# Patient Record
Sex: Female | Born: 1950
Health system: Southern US, Community
[De-identification: ages and names within clinical notes are randomized; demographics above are authoritative.]

## PROBLEM LIST (undated history)

## (undated) DIAGNOSIS — R251 Tremor, unspecified: Secondary | ICD-10-CM

## (undated) DIAGNOSIS — K219 Gastro-esophageal reflux disease without esophagitis: Secondary | ICD-10-CM

## (undated) DIAGNOSIS — M1990 Unspecified osteoarthritis, unspecified site: Secondary | ICD-10-CM

## (undated) DIAGNOSIS — M7650 Patellar tendinitis, unspecified knee: Secondary | ICD-10-CM

## (undated) DIAGNOSIS — Z8619 Personal history of other infectious and parasitic diseases: Secondary | ICD-10-CM

## (undated) DIAGNOSIS — E785 Hyperlipidemia, unspecified: Secondary | ICD-10-CM

## (undated) DIAGNOSIS — I8390 Asymptomatic varicose veins of unspecified lower extremity: Secondary | ICD-10-CM

## (undated) DIAGNOSIS — N189 Chronic kidney disease, unspecified: Secondary | ICD-10-CM

## (undated) DIAGNOSIS — E669 Obesity, unspecified: Secondary | ICD-10-CM

## (undated) DIAGNOSIS — E119 Type 2 diabetes mellitus without complications: Secondary | ICD-10-CM

## (undated) DIAGNOSIS — J40 Bronchitis, not specified as acute or chronic: Secondary | ICD-10-CM

## (undated) DIAGNOSIS — B269 Mumps without complication: Secondary | ICD-10-CM

## (undated) DIAGNOSIS — M79609 Pain in unspecified limb: Secondary | ICD-10-CM

## (undated) DIAGNOSIS — B029 Zoster without complications: Secondary | ICD-10-CM

## (undated) DIAGNOSIS — B059 Measles without complication: Secondary | ICD-10-CM

## (undated) DIAGNOSIS — I10 Essential (primary) hypertension: Secondary | ICD-10-CM

## (undated) HISTORY — DX: Obesity, unspecified: E66.9

## (undated) HISTORY — PX: BREAST BIOPSY: SHX20

## (undated) HISTORY — PX: TONSILLECTOMY: SUR1361

## (undated) HISTORY — DX: Patellar tendinitis, unspecified knee: M76.50

## (undated) HISTORY — PX: COLONOSCOPY: SHX174

## (undated) HISTORY — DX: Pain in unspecified limb: M79.609

## (undated) HISTORY — PX: BREAST EXCISIONAL BIOPSY: SUR124

## (undated) HISTORY — DX: Chronic kidney disease, unspecified: N18.9

## (undated) HISTORY — PX: LASIK: SHX215

## (undated) HISTORY — DX: Essential (primary) hypertension: I10

## (undated) HISTORY — PX: OTHER SURGICAL HISTORY: SHX169

## (undated) HISTORY — DX: Gastro-esophageal reflux disease without esophagitis: K21.9

## (undated) HISTORY — DX: Hyperlipidemia, unspecified: E78.5

## (undated) HISTORY — DX: Asymptomatic varicose veins of unspecified lower extremity: I83.90

## (undated) HISTORY — PX: VARICOSE VEIN SURGERY: SHX832

## (undated) HISTORY — DX: Type 2 diabetes mellitus without complications: E11.9

---

## 1995-04-09 HISTORY — PX: ABDOMINAL HYSTERECTOMY: SHX81

## 1998-02-03 ENCOUNTER — Encounter: Payer: Self-pay | Admitting: Obstetrics and Gynecology

## 1998-02-03 ENCOUNTER — Ambulatory Visit (HOSPITAL_COMMUNITY): Admission: RE | Admit: 1998-02-03 | Discharge: 1998-02-03 | Payer: Self-pay | Admitting: Obstetrics and Gynecology

## 1998-10-05 ENCOUNTER — Ambulatory Visit (HOSPITAL_COMMUNITY): Admission: RE | Admit: 1998-10-05 | Discharge: 1998-10-05 | Payer: Self-pay | Admitting: Obstetrics and Gynecology

## 1998-10-05 ENCOUNTER — Encounter: Payer: Self-pay | Admitting: Obstetrics and Gynecology

## 1999-01-12 ENCOUNTER — Encounter: Payer: Self-pay | Admitting: Obstetrics and Gynecology

## 1999-08-08 ENCOUNTER — Ambulatory Visit (HOSPITAL_COMMUNITY): Admission: RE | Admit: 1999-08-08 | Discharge: 1999-08-08 | Payer: Self-pay | Admitting: Orthopedic Surgery

## 1999-08-08 ENCOUNTER — Encounter: Payer: Self-pay | Admitting: Orthopedic Surgery

## 2000-03-25 ENCOUNTER — Ambulatory Visit (HOSPITAL_COMMUNITY): Admission: RE | Admit: 2000-03-25 | Discharge: 2000-03-25 | Payer: Self-pay | Admitting: Obstetrics and Gynecology

## 2000-03-25 ENCOUNTER — Encounter: Payer: Self-pay | Admitting: Obstetrics and Gynecology

## 2000-05-14 ENCOUNTER — Ambulatory Visit (HOSPITAL_COMMUNITY): Admission: RE | Admit: 2000-05-14 | Discharge: 2000-05-14 | Payer: Self-pay | Admitting: Obstetrics and Gynecology

## 2002-02-24 ENCOUNTER — Encounter: Payer: Self-pay | Admitting: Obstetrics and Gynecology

## 2002-02-24 ENCOUNTER — Ambulatory Visit (HOSPITAL_COMMUNITY): Admission: RE | Admit: 2002-02-24 | Discharge: 2002-02-24 | Payer: Self-pay | Admitting: Obstetrics and Gynecology

## 2003-04-07 ENCOUNTER — Ambulatory Visit (HOSPITAL_COMMUNITY): Admission: RE | Admit: 2003-04-07 | Discharge: 2003-04-07 | Payer: Self-pay | Admitting: Obstetrics and Gynecology

## 2003-04-09 HISTORY — PX: KNEE SURGERY: SHX244

## 2003-06-24 ENCOUNTER — Ambulatory Visit (HOSPITAL_BASED_OUTPATIENT_CLINIC_OR_DEPARTMENT_OTHER): Admission: RE | Admit: 2003-06-24 | Discharge: 2003-06-24 | Payer: Self-pay | Admitting: Specialist

## 2003-06-24 ENCOUNTER — Ambulatory Visit (HOSPITAL_COMMUNITY): Admission: RE | Admit: 2003-06-24 | Discharge: 2003-06-24 | Payer: Self-pay | Admitting: Specialist

## 2004-04-06 ENCOUNTER — Ambulatory Visit (HOSPITAL_COMMUNITY): Admission: RE | Admit: 2004-04-06 | Discharge: 2004-04-06 | Payer: Self-pay | Admitting: Obstetrics and Gynecology

## 2005-06-05 ENCOUNTER — Encounter: Payer: Self-pay | Admitting: Obstetrics and Gynecology

## 2005-12-17 ENCOUNTER — Ambulatory Visit (HOSPITAL_COMMUNITY): Admission: RE | Admit: 2005-12-17 | Discharge: 2005-12-17 | Payer: Self-pay | Admitting: Gastroenterology

## 2006-08-22 ENCOUNTER — Ambulatory Visit (HOSPITAL_COMMUNITY): Admission: RE | Admit: 2006-08-22 | Discharge: 2006-08-22 | Payer: Self-pay | Admitting: Obstetrics and Gynecology

## 2007-12-07 ENCOUNTER — Ambulatory Visit (HOSPITAL_COMMUNITY): Admission: RE | Admit: 2007-12-07 | Discharge: 2007-12-07 | Payer: Self-pay | Admitting: Obstetrics and Gynecology

## 2008-02-17 ENCOUNTER — Ambulatory Visit (HOSPITAL_COMMUNITY): Admission: RE | Admit: 2008-02-17 | Discharge: 2008-02-17 | Payer: Self-pay | Admitting: Endocrinology

## 2009-04-06 ENCOUNTER — Ambulatory Visit (HOSPITAL_COMMUNITY): Admission: RE | Admit: 2009-04-06 | Discharge: 2009-04-06 | Payer: Self-pay | Admitting: Obstetrics and Gynecology

## 2009-06-19 ENCOUNTER — Encounter: Payer: Self-pay | Admitting: Sports Medicine

## 2009-07-13 ENCOUNTER — Ambulatory Visit: Payer: Self-pay | Admitting: Sports Medicine

## 2009-07-13 DIAGNOSIS — M7650 Patellar tendinitis, unspecified knee: Secondary | ICD-10-CM | POA: Insufficient documentation

## 2009-07-13 DIAGNOSIS — M79609 Pain in unspecified limb: Secondary | ICD-10-CM | POA: Insufficient documentation

## 2010-01-09 ENCOUNTER — Ambulatory Visit (HOSPITAL_COMMUNITY): Admission: RE | Admit: 2010-01-09 | Discharge: 2010-01-09 | Payer: Self-pay | Admitting: Endocrinology

## 2010-05-08 NOTE — Consult Note (Signed)
Summary: [facility]  [facility]   Imported By: Marily Memos 07/14/2009 10:33:22  _____________________________________________________________________  External Attachment:    Type:   Image     Comment:   External Document

## 2010-05-08 NOTE — Assessment & Plan Note (Signed)
Summary: WC,NP,L KNEE INJURY,MC   Vital Signs:  Patient profile:   60 year old female Height:      67 inches Weight:      210 pounds BMI:     33.01 BP sitting:   108 / 68  Vitals Entered By: Lillia Pauls CMA (July 13, 2009 11:41 AM)  History of Present Illness: WC Injury DOI 06/05/09 Seen at [facility] 06/06/09 had a fall directly onto left knee anteriorly after tripping over [place] cable knee swelled immediately to top of patella and down used ice on her exam bruising noted over patellas and swelling in this area even on 3/14  Since then the ant continues to hurt thre is a knot present below knee that is tender w pressure no locking, giving out or IA joint sxs    Second problem which she can't specifically say was 2/2 injury Late March awkened with neck pain has persisted since then could not turn neck left to right went to kneaded energy - massage did not help Ice pak helps memory foam pillow helps  Allergies (verified): No Known Drug Allergies  Physical Exam  General:  Well-developed,well-nourished,in no acute distress; alert,appropriate and cooperative throughout examination overweight-appearing.   Msk:  LEFT knee exam shows no effusion; stable ligaments; negative Mcmurray's and provocative meniscal tests; non painful patellar compression; patellar and quadriceps tendons unremarkable.  area at distal PT insertion and in soft tissue around this is still slt swollen there is a nodular sensation on palp mildly TTP Additional Exam:  MSK [place] There continues to be some swelling around the patellar tendon that is increased at distal insertion into TT There is some hypoechoic change to medial side of pat tendon these look like to discreet areas with diameter of 2 cms consistent in appearnce with old organized hematomas noevessels are present in this area on doppler with mild inc in flow  images saved   Impression & Recommendations:  Problem # 1:  LEG PAIN, LEFT  (ICD-729.5)  This is actually below knee joint proper consistent with her fall and impact injury now with old hematomas that are resolving but still TTP  would cont ice for these  Orders: [place] LIMITED ([id number])  Problem # 2:  PATELLAR TENDINITIS (ICD-726.64)  This is likely post traumatic and the swelling around tendon is now mild trial on voltaren gel keep up ice massage to tendon  watch down steps watch sitting with too much knee bend  do some str leg raises to keep quad strength  reck if not resolving  Orders: [place] LIMITED ([id number])  Complete Medication List: 1)  Voltaren 1 % Gel (Diclofenac sodium) .... Use 1 to 2 gram qid Prescriptions: VOLTAREN 1 % GEL (DICLOFENAC SODIUM) use 1 to 2 gram qid  #200 grams x 3   Entered and Authorized by:   Enid Baas MD   Signed by:   Enid Baas MD on 07/13/2009   Method used:   Electronically to        [facility]* (retail)       [address]       [address] Shipping/mailing       [place], [place]  [address]       Ph: [phone]       Fax: [phone]   RxID:   [id number]

## 2010-08-24 NOTE — Op Note (Signed)
Jessica Bowers, Jessica Bowers              ACCOUNT NO.:  [id number]   MEDICAL RECORD NO.:  [id number]          PATIENT TYPE:  AMB   LOCATION:  ENDO                         FACILITY:  [facility]   PHYSICIAN:  Bernette Redbird, M.D.   DATE OF BIRTH:  [birth year]   DATE OF PROCEDURE:  12/17/2005  DATE OF DISCHARGE:                                 OPERATIVE REPORT   PROCEDURE:  Colonoscopy.   INDICATIONS:  Initial colon cancer screening exam in a patient with a family  history of colon polyps.   FINDINGS:  Normal exam to the terminal ileum.   DESCRIPTION OF PROCEDURE:  The nature, purpose, and risks of the procedure  had been discussed with the patient through our office open access program;  and I reviewed the purpose and risks with the patient prior to the exam.  She had provided written consent.  Sedation prior to and during the course  of the exam totaled:  Fentanyl 100 mcg and Versed 7 mg IV.  There was some  initial transient desaturation to 86% which resolved by lifting the  patient's chin.   The Olympus adult adjustable tension video colonoscope was advanced the  terminal ileum without significant difficulty, just some minor looping.  Pullback was then performed.  The quality of prep was excellent; and it was  felt that all areas were well seen.  This was a normal examination.  No  polyps, cancer, colitis, vascular malformations, or diverticulosis were  noted.  Retroflexion in the rectum and reinspection of the rectum were  unremarkable.   No biopsies were obtained.  The patient tolerated the procedure well and  there no apparent complications.   IMPRESSION:  Normal screening colonoscopy in a patient with a family history  of colon polyps in her mother (histology not known).   PLAN:  Repeat colonoscopy in 5 years.           ______________________________  Bernette Redbird, M.D.     RB/MEDQ  D:  12/17/2005  T:  12/17/2005  Job:  [id number]   cc:   Alfonse Alpers. Dagoberto Ligas, M.D.

## 2010-08-24 NOTE — Op Note (Signed)
NAMELAYNIE, ESPY                          ACCOUNT NO.:  [id number]   MEDICAL RECORD NO.:  [id number]                   PATIENT TYPE:  AMB   LOCATION:  DSC                                  FACILITY:  [facility]   PHYSICIAN:  Erasmo Leventhal, M.D.         DATE OF BIRTH:  Jan 22, 1951   DATE OF PROCEDURE:  06/24/2003  DATE OF DISCHARGE:  06/24/2003                                 OPERATIVE REPORT   PREOPERATIVE DIAGNOSIS:  Right knee osteoarthritis, probable torn meniscus.   POSTOPERATIVE DIAGNOSIS:  Right knee osteoarthritis.   PROCEDURE:  Right knee arthroscopic tricompartmental chondroplasty,  synovectomy.   SURGEON:  Erasmo Leventhal, M.D.   ASSISTANT:  Jaquelyn Bitter. Chabon, P.A.   ANESTHESIA:  Local block with MAC.   ESTIMATED BLOOD LOSS:  Less than 15 cubic centimeters.   DRAINS:  None.   COMPLICATIONS:  None.   TOURNIQUET TIME:  None.   DISPOSITION:  To the PACU stable.   OPERATIVE DETAILS:  The patient and family were counseled in the holding  area, the correct side was identified, IV started, antibiotics given and  block was administered. She was taken to the operating room and placed in  the supine position under initially MAC anesthesia, prepped with DuraPrep  and auto-draped in sterile fashion.   While trying to make the proximal medial portal, she was having some  discomfort and she was now placed deeper with antibiotics at this time.  Following this, the proximal medial portal was established. The knee was  infiltrated with saline.   Proximal medial, anteromedial and anterolateral portals now established.  Patellofemoral joint revealed a normal tracking but unfortunately, there was  grade III chondromalacia of the patella and grade III of the femoral  trochlea and grade IV on the lateral aspect of the femoral trochlea.  Mechanical chondroplasty was performed with mechanical shaving back to a  stable base of all chondral flaps and synovectomy was  performed. The ACL and  PCL were intact. The medial side was inspected with a grade III  chondromalacia of the medial femoral condyle and grade II of the medial  tibial plateau. The medial meniscus was found to be without tear, but it was  thickened, hard and degenerative, but no tears. The lateral side was  inspected. The lateral meniscus was intact and there was grade II  chondromalacia. Light chondroplasty was performed there also. Synovectomy  was performed anteromedial and anterolateral. The knee was then sequentially  reinspected, there were no other abnormalities noted. After copious  irrigation the arthroscopic equipment was removed. The two ports were closed  with one suture. Note - 15 cubic centimeters of 0.25% Marcaine with epi and  4 mg of morphine sulfate was used to obtain hemostasis and as a knee block.  A sterile compressive dressing was applied to the knee, TED hose and ice  pack.   There were no complications. Sponge and needle count were correct. She  was  then gently awakened and taken to the recovery room in satisfactory stable  condition.   The plan will be stabilization in the PACU, then discharged to home.                                               Erasmo Leventhal, M.D.    RAC/MEDQ  D:  06/24/2003  T:  06/27/2003  Job:  [id number]

## 2010-12-28 ENCOUNTER — Other Ambulatory Visit (HOSPITAL_COMMUNITY): Payer: Self-pay | Admitting: Obstetrics and Gynecology

## 2010-12-28 DIAGNOSIS — N83209 Unspecified ovarian cyst, unspecified side: Secondary | ICD-10-CM

## 2010-12-29 ENCOUNTER — Ambulatory Visit (HOSPITAL_COMMUNITY): Payer: 59

## 2011-01-01 ENCOUNTER — Other Ambulatory Visit (HOSPITAL_COMMUNITY): Payer: Self-pay | Admitting: Obstetrics and Gynecology

## 2011-01-01 ENCOUNTER — Ambulatory Visit (HOSPITAL_COMMUNITY)
Admission: RE | Admit: 2011-01-01 | Discharge: 2011-01-01 | Disposition: A | Discharge status: Home / Self Care | Payer: 59 | Source: Ambulatory Visit | Attending: Obstetrics and Gynecology | Admitting: Obstetrics and Gynecology

## 2011-01-01 DIAGNOSIS — N83209 Unspecified ovarian cyst, unspecified side: Secondary | ICD-10-CM

## 2012-01-22 ENCOUNTER — Ambulatory Visit: Payer: 59 | Attending: Family Medicine | Admitting: Physical Therapy

## 2012-01-22 DIAGNOSIS — M25676 Stiffness of unspecified foot, not elsewhere classified: Secondary | ICD-10-CM | POA: Insufficient documentation

## 2012-01-22 DIAGNOSIS — M25673 Stiffness of unspecified ankle, not elsewhere classified: Secondary | ICD-10-CM | POA: Insufficient documentation

## 2012-01-22 DIAGNOSIS — M25579 Pain in unspecified ankle and joints of unspecified foot: Secondary | ICD-10-CM | POA: Insufficient documentation

## 2012-01-22 DIAGNOSIS — IMO0001 Reserved for inherently not codable concepts without codable children: Secondary | ICD-10-CM | POA: Insufficient documentation

## 2012-01-23 ENCOUNTER — Ambulatory Visit: Payer: 59 | Admitting: Physical Therapy

## 2012-01-28 ENCOUNTER — Ambulatory Visit: Payer: 59 | Admitting: Physical Therapy

## 2012-01-29 ENCOUNTER — Ambulatory Visit: Payer: 59

## 2012-01-30 ENCOUNTER — Ambulatory Visit: Payer: 59 | Admitting: Physical Therapy

## 2012-02-05 ENCOUNTER — Encounter: Payer: 59 | Admitting: Physical Therapy

## 2012-02-05 ENCOUNTER — Ambulatory Visit: Payer: 59

## 2012-02-07 ENCOUNTER — Ambulatory Visit: Payer: 59 | Attending: Family Medicine | Admitting: Physical Therapy

## 2012-02-07 DIAGNOSIS — M25579 Pain in unspecified ankle and joints of unspecified foot: Secondary | ICD-10-CM | POA: Insufficient documentation

## 2012-02-07 DIAGNOSIS — IMO0001 Reserved for inherently not codable concepts without codable children: Secondary | ICD-10-CM | POA: Insufficient documentation

## 2012-02-07 DIAGNOSIS — M25673 Stiffness of unspecified ankle, not elsewhere classified: Secondary | ICD-10-CM | POA: Insufficient documentation

## 2012-02-07 DIAGNOSIS — M25676 Stiffness of unspecified foot, not elsewhere classified: Secondary | ICD-10-CM | POA: Insufficient documentation

## 2012-02-10 ENCOUNTER — Ambulatory Visit: Payer: 59 | Admitting: Physical Therapy

## 2012-02-11 ENCOUNTER — Ambulatory Visit: Payer: 59 | Admitting: Physical Therapy

## 2012-02-13 ENCOUNTER — Ambulatory Visit: Payer: 59 | Admitting: Physical Therapy

## 2012-02-18 ENCOUNTER — Ambulatory Visit: Payer: 59 | Admitting: Physical Therapy

## 2012-02-20 ENCOUNTER — Ambulatory Visit: Payer: 59 | Admitting: Physical Therapy

## 2012-02-24 ENCOUNTER — Ambulatory Visit: Payer: 59 | Admitting: Physical Therapy

## 2012-02-27 ENCOUNTER — Ambulatory Visit: Payer: 59 | Admitting: Physical Therapy

## 2012-03-02 ENCOUNTER — Ambulatory Visit: Payer: 59 | Admitting: Physical Therapy

## 2012-03-03 ENCOUNTER — Ambulatory Visit: Payer: 59 | Admitting: Physical Therapy

## 2012-03-10 ENCOUNTER — Ambulatory Visit: Payer: 59 | Attending: Family Medicine | Admitting: Physical Therapy

## 2012-03-10 DIAGNOSIS — M25579 Pain in unspecified ankle and joints of unspecified foot: Secondary | ICD-10-CM | POA: Insufficient documentation

## 2012-03-10 DIAGNOSIS — IMO0001 Reserved for inherently not codable concepts without codable children: Secondary | ICD-10-CM | POA: Insufficient documentation

## 2012-03-10 DIAGNOSIS — M25673 Stiffness of unspecified ankle, not elsewhere classified: Secondary | ICD-10-CM | POA: Insufficient documentation

## 2012-03-10 DIAGNOSIS — M25676 Stiffness of unspecified foot, not elsewhere classified: Secondary | ICD-10-CM | POA: Insufficient documentation

## 2012-03-12 ENCOUNTER — Ambulatory Visit: Payer: 59 | Admitting: Physical Therapy

## 2012-03-13 ENCOUNTER — Ambulatory Visit: Payer: 59 | Admitting: Physical Therapy

## 2012-03-17 ENCOUNTER — Ambulatory Visit: Payer: 59 | Admitting: Physical Therapy

## 2012-03-17 ENCOUNTER — Encounter: Payer: 59 | Admitting: Physical Therapy

## 2012-03-19 ENCOUNTER — Ambulatory Visit: Payer: 59 | Admitting: Physical Therapy

## 2012-03-24 ENCOUNTER — Ambulatory Visit: Payer: 59 | Admitting: Physical Therapy

## 2012-03-26 ENCOUNTER — Ambulatory Visit: Payer: 59 | Admitting: Physical Therapy

## 2012-03-31 ENCOUNTER — Ambulatory Visit: Payer: 59 | Admitting: Physical Therapy

## 2012-04-02 ENCOUNTER — Ambulatory Visit: Payer: 59 | Admitting: Physical Therapy

## 2012-04-07 ENCOUNTER — Ambulatory Visit: Payer: 59 | Admitting: Physical Therapy

## 2012-04-09 ENCOUNTER — Ambulatory Visit: Payer: 59 | Attending: Family Medicine | Admitting: Physical Therapy

## 2012-04-09 DIAGNOSIS — M25673 Stiffness of unspecified ankle, not elsewhere classified: Secondary | ICD-10-CM | POA: Insufficient documentation

## 2012-04-09 DIAGNOSIS — IMO0001 Reserved for inherently not codable concepts without codable children: Secondary | ICD-10-CM | POA: Insufficient documentation

## 2012-04-09 DIAGNOSIS — M25676 Stiffness of unspecified foot, not elsewhere classified: Secondary | ICD-10-CM | POA: Insufficient documentation

## 2012-04-09 DIAGNOSIS — M25579 Pain in unspecified ankle and joints of unspecified foot: Secondary | ICD-10-CM | POA: Insufficient documentation

## 2012-04-14 ENCOUNTER — Ambulatory Visit: Payer: 59 | Attending: Family Medicine | Admitting: Physical Therapy

## 2012-04-14 DIAGNOSIS — M25673 Stiffness of unspecified ankle, not elsewhere classified: Secondary | ICD-10-CM | POA: Insufficient documentation

## 2012-04-14 DIAGNOSIS — M25579 Pain in unspecified ankle and joints of unspecified foot: Secondary | ICD-10-CM | POA: Insufficient documentation

## 2012-04-14 DIAGNOSIS — M25676 Stiffness of unspecified foot, not elsewhere classified: Secondary | ICD-10-CM | POA: Insufficient documentation

## 2012-04-14 DIAGNOSIS — IMO0001 Reserved for inherently not codable concepts without codable children: Secondary | ICD-10-CM | POA: Insufficient documentation

## 2012-04-16 ENCOUNTER — Ambulatory Visit: Payer: 59 | Admitting: Physical Therapy

## 2012-04-21 ENCOUNTER — Ambulatory Visit: Payer: 59 | Admitting: Physical Therapy

## 2012-04-23 ENCOUNTER — Ambulatory Visit: Payer: 59

## 2012-04-28 ENCOUNTER — Ambulatory Visit: Payer: 59 | Admitting: Physical Therapy

## 2012-04-30 ENCOUNTER — Ambulatory Visit: Payer: 59 | Admitting: Physical Therapy

## 2012-05-05 ENCOUNTER — Ambulatory Visit: Payer: 59 | Admitting: Physical Therapy

## 2012-05-07 ENCOUNTER — Ambulatory Visit: Payer: 59 | Admitting: Physical Therapy

## 2012-06-04 ENCOUNTER — Other Ambulatory Visit (HOSPITAL_COMMUNITY): Payer: Self-pay | Admitting: Obstetrics and Gynecology

## 2012-06-04 DIAGNOSIS — Z1231 Encounter for screening mammogram for malignant neoplasm of breast: Secondary | ICD-10-CM

## 2012-06-05 ENCOUNTER — Ambulatory Visit (HOSPITAL_COMMUNITY)
Admission: RE | Admit: 2012-06-05 | Discharge: 2012-06-05 | Disposition: A | Discharge status: Home / Self Care | Payer: 59 | Source: Ambulatory Visit | Attending: Obstetrics and Gynecology | Admitting: Obstetrics and Gynecology

## 2012-06-05 DIAGNOSIS — Z1231 Encounter for screening mammogram for malignant neoplasm of breast: Secondary | ICD-10-CM | POA: Insufficient documentation

## 2012-12-17 ENCOUNTER — Other Ambulatory Visit: Payer: Self-pay | Admitting: Cardiology

## 2012-12-17 DIAGNOSIS — R079 Chest pain, unspecified: Secondary | ICD-10-CM

## 2012-12-23 ENCOUNTER — Encounter (HOSPITAL_COMMUNITY)
Admission: RE | Admit: 2012-12-23 | Discharge: 2012-12-23 | Disposition: A | Discharge status: Home / Self Care | Payer: 59 | Source: Ambulatory Visit | Attending: Cardiology | Admitting: Cardiology

## 2012-12-23 DIAGNOSIS — R079 Chest pain, unspecified: Secondary | ICD-10-CM | POA: Insufficient documentation

## 2012-12-23 DIAGNOSIS — I2589 Other forms of chronic ischemic heart disease: Secondary | ICD-10-CM | POA: Insufficient documentation

## 2012-12-23 MED ORDER — REGADENOSON 0.4 MG/5ML IV SOLN
INTRAVENOUS | Status: AC
  Filled 2012-12-23: qty 5

## 2012-12-23 MED ORDER — REGADENOSON 0.4 MG/5ML IV SOLN
0.4000 mg | Freq: Once | INTRAVENOUS | Status: AC
  Administered 2012-12-23: 09:00:00 via INTRAVENOUS

## 2012-12-23 MED ORDER — TECHNETIUM TC 99M SESTAMIBI GENERIC - CARDIOLITE
30.0000 | Freq: Once | INTRAVENOUS | Status: AC | PRN
  Administered 2012-12-23: 30 via INTRAVENOUS

## 2012-12-24 ENCOUNTER — Encounter (HOSPITAL_COMMUNITY)
Admission: RE | Admit: 2012-12-24 | Discharge: 2012-12-24 | Disposition: A | Discharge status: Home / Self Care | Payer: 59 | Source: Ambulatory Visit | Attending: Cardiology | Admitting: Cardiology

## 2012-12-24 MED ORDER — TECHNETIUM TC 99M SESTAMIBI GENERIC - CARDIOLITE
30.0000 | Freq: Once | INTRAVENOUS | Status: AC | PRN
  Administered 2012-12-24: 30 via INTRAVENOUS

## 2013-02-05 ENCOUNTER — Telehealth: Payer: Self-pay | Admitting: Cardiology

## 2013-02-05 NOTE — Telephone Encounter (Signed)
New message   Dr Anne Fu took her off maxide---by the end of the day--her feet are swollen a lot. Need advise

## 2013-02-08 NOTE — Telephone Encounter (Signed)
Follow Up  Pt was taken off of Maxzide and her feet began to swell very badly with pain// She states her BP is fine, however she is swelling pretty bad//requests a call back for an alternative treatment.

## 2013-02-08 NOTE — Telephone Encounter (Signed)
I reviewed patient's complaint with Dr. Anne Fu.  Dr. Anne Fu was able to access medical record from [facility] and states patient is being followed by Dr. Sharl Ma for Maxzide and needs to address concerns with him.  I advised patient of Dr. Minerva Fester advice.  Patient verbalized understanding and agreement.

## 2013-02-24 ENCOUNTER — Encounter: Payer: Self-pay | Admitting: Cardiology

## 2013-02-24 ENCOUNTER — Ambulatory Visit (INDEPENDENT_AMBULATORY_CARE_PROVIDER_SITE_OTHER): Payer: 59 | Admitting: Cardiology

## 2013-02-24 VITALS — BP 122/82 | HR 60 | Ht 67.0 in | Wt 240.0 lb

## 2013-02-24 DIAGNOSIS — I10 Essential (primary) hypertension: Secondary | ICD-10-CM

## 2013-02-24 DIAGNOSIS — E785 Hyperlipidemia, unspecified: Secondary | ICD-10-CM

## 2013-02-24 DIAGNOSIS — R9439 Abnormal result of other cardiovascular function study: Secondary | ICD-10-CM

## 2013-02-24 NOTE — Patient Instructions (Signed)
Your physician recommends that you continue on your current medications as directed. Please refer to the Current Medication list given to you today.  Your physician wants you to follow-up in: 6 months with Dr. Skains. You will receive a reminder letter in the mail two months in advance. If you don't receive a letter, please call our office to schedule the follow-up appointment.  

## 2013-02-24 NOTE — Progress Notes (Signed)
[address] [place], [place]  [address] Phone: [phone] Fax:  [phone]  Date:  02/24/2013   ID:  Jessica Bowers, DOB [birth year], MRN [id number]  PCP:  Talmage Coin, MD   History of Present Illness: Jessica Bowers is a 62 y.o. female with diabetes, hypertension here for followup of chest discomfort. Over the past 2 months she has had 4 separate episodes of chest discomfort/tightness including a 1-1/2 hour episode a few days ago that began while sitting at her desk. She feels somewhat dyspneic with her symptoms Felt like she needed to sit up straight to relieve. She "knows" it was not indigestion., Non exertional. Although Sunday, did feels some pain when cleaning out the attic. Subsided. 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 CKD. She took an aspirin which seemed to help. She has not tolerated statins in the past. Has diabetes, obesity, hypertension. Her last hemoglobin A1c was 6.6, LDL 119 EKG on 12/11/12 shows sinus bradycardia rate 56 with no other abnormalities. Notes: Mild area of reversible ischemia in the apex/distal anteroseptal wall. Normal ejection fraction. Overall low risk test. medical management. She has lost 25 pounds. She stopped Mazide. Had one episode Friday night with wave of nausea. On Liipitor, off of pravastatin. Only had severe cramps once. Dehydrated that day. Compression hose.     Wt Readings from Last 3 Encounters:  02/24/13 240 lb (108.863 kg)  07/13/09 210 lb (95.255 kg)     No past medical history on file.  No past surgical history on file.  Current Outpatient Prescriptions  Medication Sig Dispense Refill  . atenolol (TENORMIN) 50 MG tablet Take 50 mg by mouth daily.      . cholecalciferol (VITAMIN D) 1000 UNITS tablet Take 1,000 Units by mouth daily.      Marland Kitchen GNP CINNAMON PO Take by mouth.      . linagliptin (TRADJENTA) 5 MG TABS tablet Take 5 mg by mouth daily.      Marland Kitchen losartan (COZAAR) 100 MG tablet Take 100 mg by mouth daily.       . Multiple Vitamin (MULTIVITAMIN) capsule Take 1 capsule by mouth daily.      . Omega 3 1200 MG CAPS Take by mouth.      . oxybutynin (DITROPAN-XL) 10 MG 24 hr tablet Take 10 mg by mouth at bedtime.      . pravastatin (PRAVACHOL) 10 MG tablet Take 10 mg by mouth daily.      . vitamin B-12 (CYANOCOBALAMIN) 500 MCG tablet Take 500 mcg by mouth daily.       No current facility-administered medications for this visit.    Allergies:    Allergies  Allergen Reactions  . Ace Inhibitors Cough  . Crestor [Rosuvastatin] Other (See Comments)    Leg cramps  . Glucophage [Metformin] Other (See Comments)    headaches  . Nsaids Nausea And Vomiting    Social History:  The patient  reports that she has never smoked. She does not have any smokeless tobacco history on file.   ROS:  Please see the history of present illness.   No syncope, no bleeding, no orthopnea, PND  PHYSICAL EXAM: VS:  BP 122/82  Pulse 60  Ht 5\' 7"  (1.702 m)  Wt 240 lb (108.863 kg)  BMI 37.58 kg/m2 Well nourished, well developed, in no acute distress HEENT: normal Neck: no JVD Cardiac:  normal S1, S2; RRR; no murmur Lungs:  clear to auscultation bilaterally, no wheezing, rhonchi or rales  Abd: soft, nontender, no hepatomegaly overweight Ext: no edemaCompression hose Skin: warm and dry Neuro: no focal abnormalities noted  EKG:  None today     ASSESSMENT AND PLAN:  1. Previously abnormal stress test-mild area of reversibility in the apical region, possibly breast attenuation. Previous creatinine 1.6. Maxide had been discontinued. She is wearing compression hose. Overall she is feeling better. She has lost 25 pounds. Excellent. Continue with current progress, exercise. She may need surgery in April for left Achilles tendon. With current progress, I would feel comfortable allowing her to proceed with surgery. It may be a good idea to see her back then prior to surgery since it will be several months away. 2. HTN - good  control on current meds off Maxide. 3. Continue with statin. Doing well. Changed previously from pravastatin to lipitor.   Signed, Donato Schultz, MD [facility]  02/24/2013 3:59 PM

## 2013-03-03 ENCOUNTER — Other Ambulatory Visit (HOSPITAL_COMMUNITY): Payer: Self-pay | Admitting: Obstetrics and Gynecology

## 2013-03-03 DIAGNOSIS — Z139 Encounter for screening, unspecified: Secondary | ICD-10-CM

## 2013-03-11 ENCOUNTER — Other Ambulatory Visit (HOSPITAL_COMMUNITY): Payer: Self-pay | Admitting: Obstetrics and Gynecology

## 2013-03-11 DIAGNOSIS — Z1231 Encounter for screening mammogram for malignant neoplasm of breast: Secondary | ICD-10-CM

## 2013-03-15 ENCOUNTER — Ambulatory Visit (HOSPITAL_COMMUNITY): Payer: 59 | Attending: Obstetrics and Gynecology

## 2013-03-22 ENCOUNTER — Other Ambulatory Visit (HOSPITAL_COMMUNITY): Payer: Self-pay | Admitting: Orthopedic Surgery

## 2013-03-22 DIAGNOSIS — M25572 Pain in left ankle and joints of left foot: Secondary | ICD-10-CM

## 2013-03-24 ENCOUNTER — Ambulatory Visit (HOSPITAL_COMMUNITY)
Admission: RE | Admit: 2013-03-24 | Discharge: 2013-03-24 | Disposition: A | Discharge status: Home / Self Care | Payer: 59 | Source: Ambulatory Visit | Attending: Orthopedic Surgery | Admitting: Orthopedic Surgery

## 2013-03-24 DIAGNOSIS — S93499A Sprain of other ligament of unspecified ankle, initial encounter: Secondary | ICD-10-CM | POA: Insufficient documentation

## 2013-03-24 DIAGNOSIS — X58XXXA Exposure to other specified factors, initial encounter: Secondary | ICD-10-CM | POA: Insufficient documentation

## 2013-03-24 DIAGNOSIS — M7730 Calcaneal spur, unspecified foot: Secondary | ICD-10-CM | POA: Insufficient documentation

## 2013-03-24 DIAGNOSIS — R609 Edema, unspecified: Secondary | ICD-10-CM | POA: Insufficient documentation

## 2013-03-24 DIAGNOSIS — M25572 Pain in left ankle and joints of left foot: Secondary | ICD-10-CM

## 2013-03-24 DIAGNOSIS — M7750 Other enthesopathy of unspecified foot: Secondary | ICD-10-CM | POA: Insufficient documentation

## 2013-03-24 DIAGNOSIS — M722 Plantar fascial fibromatosis: Secondary | ICD-10-CM | POA: Insufficient documentation

## 2013-05-01 ENCOUNTER — Encounter: Payer: Self-pay | Admitting: Cardiology

## 2013-05-01 ENCOUNTER — Encounter: Payer: Self-pay | Admitting: *Deleted

## 2013-05-01 DIAGNOSIS — I10 Essential (primary) hypertension: Secondary | ICD-10-CM | POA: Insufficient documentation

## 2013-05-01 DIAGNOSIS — E119 Type 2 diabetes mellitus without complications: Secondary | ICD-10-CM | POA: Insufficient documentation

## 2013-05-01 DIAGNOSIS — E785 Hyperlipidemia, unspecified: Secondary | ICD-10-CM | POA: Insufficient documentation

## 2013-05-01 DIAGNOSIS — K219 Gastro-esophageal reflux disease without esophagitis: Secondary | ICD-10-CM | POA: Insufficient documentation

## 2013-05-01 DIAGNOSIS — N189 Chronic kidney disease, unspecified: Secondary | ICD-10-CM | POA: Insufficient documentation

## 2013-05-03 ENCOUNTER — Encounter (HOSPITAL_BASED_OUTPATIENT_CLINIC_OR_DEPARTMENT_OTHER): Payer: Self-pay | Admitting: *Deleted

## 2013-05-03 NOTE — Progress Notes (Signed)
Pt works wom[employer]-sees dr Marlou Porch for hx cp-htn-seeing him 05/04/13- To come in for bmet No sleep apnea

## 2013-05-04 ENCOUNTER — Encounter: Payer: Self-pay | Admitting: Cardiology

## 2013-05-04 ENCOUNTER — Ambulatory Visit (INDEPENDENT_AMBULATORY_CARE_PROVIDER_SITE_OTHER): Payer: 59 | Admitting: Cardiology

## 2013-05-04 ENCOUNTER — Encounter (HOSPITAL_BASED_OUTPATIENT_CLINIC_OR_DEPARTMENT_OTHER)
Admission: RE | Admit: 2013-05-04 | Discharge: 2013-05-04 | Disposition: A | Discharge status: Home / Self Care | Payer: 59 | Source: Ambulatory Visit | Attending: Orthopedic Surgery | Admitting: Orthopedic Surgery

## 2013-05-04 VITALS — BP 142/58 | HR 61 | Ht 67.0 in | Wt 239.6 lb

## 2013-05-04 DIAGNOSIS — E119 Type 2 diabetes mellitus without complications: Secondary | ICD-10-CM

## 2013-05-04 DIAGNOSIS — R079 Chest pain, unspecified: Secondary | ICD-10-CM

## 2013-05-04 DIAGNOSIS — Z01810 Encounter for preprocedural cardiovascular examination: Secondary | ICD-10-CM

## 2013-05-04 DIAGNOSIS — E7800 Pure hypercholesterolemia, unspecified: Secondary | ICD-10-CM

## 2013-05-04 DIAGNOSIS — I10 Essential (primary) hypertension: Secondary | ICD-10-CM

## 2013-05-04 DIAGNOSIS — E669 Obesity, unspecified: Secondary | ICD-10-CM

## 2013-05-04 LAB — BASIC METABOLIC PANEL
BUN: 13 mg/dL (ref 6–23)
CHLORIDE: 101 meq/L (ref 96–112)
CO2: 27 mEq/L (ref 19–32)
CREATININE: 0.83 mg/dL (ref 0.50–1.10)
Calcium: 9.6 mg/dL (ref 8.4–10.5)
GFR, EST AFRICAN AMERICAN: 87 mL/min — AB (ref >90)
GFR, EST NON AFRICAN AMERICAN: 75 mL/min — AB (ref >90)
Glucose, Bld: 193 mg/dL — ABNORMAL HIGH (ref 70–99)
Potassium: 4.4 mEq/L (ref 3.7–5.3)
Sodium: 140 mEq/L (ref 137–147)

## 2013-05-04 NOTE — Progress Notes (Signed)
[place]. [address], [address] [place], [place]  [address] Phone: [phone] Fax:  [phone]  Date:  05/04/2013   ID:  Jessica Bowers, DOB [birth year], MRN [id number]  PCP:  Delrae Rend, MD   History of Present Illness: Jessica Bowers is a 63 y.o. female here for preoperative risk stratification prior to left Achilles tendon repair by Dr. Georgena Spurling.    Previously she was seen secondary to chest discomfort. She has a history of diabetes, hypertension. Previously she has had 4 separate episodes of chest discomfort/tightness including a 1-1/2 hour episode  that began while sitting at her desk. She felt somewhat dyspneic with her symptoms. Felt like she needed to sit up straight to relieve. She "knows" it was not indigestion., Non exertional. Although Sunday, did feels some pain when cleaning out the attic. Subsided. 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 CKD. She took an aspirin which seemed to help. She has not tolerated statins in the past. Has diabetes, obesity, hypertension. Her last hemoglobin A1c was 6.6, LDL 119 EKG on 12/11/12 shows sinus bradycardia rate 56 with no other abnormalities.  Because of the chest pain, she underwent a nuclear stress test:9/14 NUC Mild area of reversible ischemia in the apex/distal anteroseptal wall. Normal ejection fraction. Overall low risk test. medical management. We had lengthy discussion about the test. Her defect could have been breast attenuation driven.  Achilles tendon repair Thursday. Had very mild discomfort 2 weeks ago. Hardly noticeable. Nothing like before.   Dr. Georgena Spurling. [place].  Wt Readings from Last 3 Encounters:  05/04/13 239 lb 9.6 oz (108.682 kg)  05/03/13 220 lb (99.791 kg)  02/24/13 240 lb (108.863 kg)     Past Medical History  Diagnosis Date  . Obesity   . Hyperlipidemia   . Diabetes   . HTN (hypertension)   . GERD (gastroesophageal reflux disease)   . CKD (chronic kidney disease)   . Patellar tendinitis   . LEG PAIN, LEFT    . Arthritis     Past Surgical History  Procedure Laterality Date  . Tonsillectomy    . Lasik    . Knee surgery  2005    right  . Colonoscopy    . Abdominal hysterectomy  1997    Current Outpatient Prescriptions  Medication Sig Dispense Refill  . atenolol (TENORMIN) 50 MG tablet Take 50 mg by mouth daily.      Marland Kitchen atorvastatin (LIPITOR) 10 MG tablet Take 20 mg by mouth daily.       . cholecalciferol (VITAMIN D) 1000 UNITS tablet Take 1,000 Units by mouth daily.      Marland Kitchen GNP CINNAMON PO Take by mouth.      . linagliptin (TRADJENTA) 5 MG TABS tablet Take 5 mg by mouth daily.      Marland Kitchen losartan (COZAAR) 100 MG tablet Take 100 mg by mouth daily.      . Multiple Vitamin (MULTIVITAMIN) capsule Take 1 capsule by mouth daily.      . Omega 3 1200 MG CAPS Take by mouth.      . oxybutynin (DITROPAN-XL) 10 MG 24 hr tablet Take 10 mg by mouth at bedtime.      . vitamin B-12 (CYANOCOBALAMIN) 500 MCG tablet Take 500 mcg by mouth daily.      . pravastatin (PRAVACHOL) 10 MG tablet Take 10 mg by mouth daily.       No current facility-administered medications for this visit.    Allergies:    Allergies  Allergen  Reactions  . Ace Inhibitors Cough  . Crestor [Rosuvastatin] Other (See Comments)    Leg cramps  . Glucophage [Metformin] Other (See Comments)    headaches  . Nsaids Nausea And Vomiting    Social History:  The patient  reports that she has never smoked. She does not have any smokeless tobacco history on file. She reports that she drinks alcohol. She reports that she does not use illicit drugs.   ROS:  Please see the history of present illness.   No CAD, no bleeding, no orthopnea, no PND    PHYSICAL EXAM: VS:  BP 142/58  Pulse 61  Ht 5\' 7"  (1.702 m)  Wt 239 lb 9.6 oz (108.682 kg)  BMI 37.52 kg/m2 Well nourished, well developed, in no acute distress HEENT: normal Neck: no JVD Cardiac:  normal S1, S2; RRR; no murmur Lungs:  clear to auscultation bilaterally, no wheezing, rhonchi or  rales Abd: soft, nontender, no hepatomegalyOverweight Ext: no edema Skin: warm and dry Neuro: no focal abnormalities noted  EKG:  Sinus rhythm, no other changes. Heart rate 61.     ASSESSMENT AND PLAN:  1. Preoperative risk assessment-she may proceed with left Achilles tendon repair with low overall cardiac risk. Less than 2% chance of cardiovascular complication. Recent nuclear stress test in September of 2014 was overall low risk showing only a mild apical/distal anteroseptal defect. We continued with medical management and she has not had any further significant chest pain episodes. Continue with beta blocker. 2. Chest pain-no further significant episodes. Continue with aggressive medical management. We once again discussed how diabetes is a coronary artery disease equivalent. 3. Obesity-continue to encourage weight loss. 4. Hyperlipidemia-continue with Lipitor 20 mg. LDL goal should be less than 100, approaching 70. 5. Diabetes-continuing to work with Dr. Buddy Duty.  Signed, Candee Furbish, MD [facility]  05/04/2013 3:41 PM

## 2013-05-04 NOTE — Patient Instructions (Signed)
Your physician recommends that you continue on your current medications as directed. Please refer to the Current Medication list given to you today.  Your physician recommends that  follow-up as scheduled.  * Patient is cleared for surgery.

## 2013-05-05 ENCOUNTER — Other Ambulatory Visit: Payer: Self-pay | Admitting: Orthopedic Surgery

## 2013-05-05 NOTE — H&P (Signed)
Jessica Bowers is an 63 y.o. female.   Chief Complaint: Left achilles pain HPI: Pt reports to OR for left achilles reconstruction with gastroc recession, excision of Haglund's deformity and possible FHL transfer.  Pt has failed conservative treatment and now presents for surgical correction of this issue.  Pt denies N/V/F/C, chest pain, SOB, or paresthesia bilaterally.  Past Medical History  Diagnosis Date  . Obesity   . Hyperlipidemia   . Diabetes   . HTN (hypertension)   . GERD (gastroesophageal reflux disease)   . CKD (chronic kidney disease)   . Patellar tendinitis   . LEG PAIN, LEFT   . Arthritis     Past Surgical History  Procedure Laterality Date  . Tonsillectomy    . Lasik    . Knee surgery  2005    right  . Colonoscopy    . Abdominal hysterectomy  1997    Family History  Problem Relation Age of Onset  . Hypertension Father   . Diabetes Father   . Hyperlipidemia Mother   . CAD Mother    Social History:  reports that she has never smoked. She does not have any smokeless tobacco history on file. She reports that she drinks alcohol. She reports that she does not use illicit drugs.  Allergies:  Allergies  Allergen Reactions  . Ace Inhibitors Cough  . Crestor [Rosuvastatin] Other (See Comments)    Leg cramps  . Glucophage [Metformin] Other (See Comments)    headaches  . Nsaids Nausea And Vomiting    No prescriptions prior to admission    Results for orders placed during the hospital encounter of 05/06/13 (from the past 48 hour(s))  BASIC METABOLIC PANEL     Status: Abnormal   Collection Time    05/04/13  4:10 PM      Result Value Range   Sodium 140  137 - 147 mEq/L   Potassium 4.4  3.7 - 5.3 mEq/L   Chloride 101  96 - 112 mEq/L   CO2 27  19 - 32 mEq/L   Glucose, Bld 193 (*) 70 - 99 mg/dL   BUN 13  6 - 23 mg/dL   Creatinine, Ser 0.83  0.50 - 1.10 mg/dL   Calcium 9.6  8.4 - 10.5 mg/dL   GFR calc non Af Amer 75 (*) >90 mL/min   GFR calc Af Amer 87 (*)  >90 mL/min   Comment: (NOTE)     The eGFR has been calculated using the CKD EPI equation.     This calculation has not been validated in all clinical situations.     eGFR's persistently <90 mL/min signify possible Chronic Kidney     Disease.   No results found.  Review of Systems  Constitutional: Negative.   HENT: Negative.   Eyes: Negative.   Respiratory: Negative.   Cardiovascular: Negative.   Gastrointestinal: Negative.   Musculoskeletal: Negative.   Skin: Negative.   Neurological: Negative for tremors.  Endo/Heme/Allergies: Negative.   Psychiatric/Behavioral: The patient is not nervous/anxious.     Height 5' 7"  (1.702 m), weight 99.791 kg (220 lb). Physical Exam  WD WN 63y/o female in NAD, A/Ox3, pt appears stated age.  EOMI, mood and affect normal, respirations unlabored.  Gait antalgic to the left, +TTP to left posterior heel, lacks 10 degrees of reaching neutral with dorsiflexion of the ankle.  +edema to the posterior left calcaneus.  Strength 5/5 with DF, PF, inversion, eversion.  DP pulses are 2+ bilaterally.  Skin healthy  and intact.  Assessment/Plan Pt reports to OR for surgical correction of insertional achilles tendonapathy of the left ankle.  The risks and benefits of the alternative treatment options have been discussed in detail.  The patient wishes to proceed with surgery and specifically understands risks of bleeding, infection, nerve damage, blood clots, need for additional surgery, amputation and death.  FLOWERS, CHRISTOPHER S 05/05/2013, 4:53 PM  Right achilles insertional tendonopathy - to OR for Achilles debridement and recon, gastroc recession, excision of Haglund deformity and possible FHL transfer.  The risks and benefits of the alternative treatment options have been discussed in detail.  The patient wishes to proceed with surgery and specifically understands risks of bleeding, infection, nerve damage, blood clots, need for additional surgery, amputation  and death.

## 2013-05-06 ENCOUNTER — Encounter (HOSPITAL_BASED_OUTPATIENT_CLINIC_OR_DEPARTMENT_OTHER)
Admission: RE | Disposition: A | Discharge status: Home / Self Care | Payer: Self-pay | Source: Ambulatory Visit | Attending: Orthopedic Surgery

## 2013-05-06 ENCOUNTER — Ambulatory Visit (HOSPITAL_BASED_OUTPATIENT_CLINIC_OR_DEPARTMENT_OTHER): Payer: 59 | Admitting: Certified Registered"

## 2013-05-06 ENCOUNTER — Encounter (HOSPITAL_BASED_OUTPATIENT_CLINIC_OR_DEPARTMENT_OTHER): Payer: Self-pay | Admitting: Certified Registered"

## 2013-05-06 ENCOUNTER — Ambulatory Visit (HOSPITAL_BASED_OUTPATIENT_CLINIC_OR_DEPARTMENT_OTHER)
Admission: RE | Admit: 2013-05-06 | Discharge: 2013-05-06 | Disposition: A | Discharge status: Home / Self Care | Payer: 59 | Source: Ambulatory Visit | Attending: Orthopedic Surgery | Admitting: Orthopedic Surgery

## 2013-05-06 ENCOUNTER — Encounter (HOSPITAL_BASED_OUTPATIENT_CLINIC_OR_DEPARTMENT_OTHER): Payer: 59 | Admitting: Certified Registered"

## 2013-05-06 DIAGNOSIS — M129 Arthropathy, unspecified: Secondary | ICD-10-CM | POA: Insufficient documentation

## 2013-05-06 DIAGNOSIS — E119 Type 2 diabetes mellitus without complications: Secondary | ICD-10-CM | POA: Insufficient documentation

## 2013-05-06 DIAGNOSIS — M6240 Contracture of muscle, unspecified site: Secondary | ICD-10-CM | POA: Insufficient documentation

## 2013-05-06 DIAGNOSIS — N189 Chronic kidney disease, unspecified: Secondary | ICD-10-CM | POA: Insufficient documentation

## 2013-05-06 DIAGNOSIS — M928 Other specified juvenile osteochondrosis: Secondary | ICD-10-CM | POA: Insufficient documentation

## 2013-05-06 DIAGNOSIS — M7650 Patellar tendinitis, unspecified knee: Secondary | ICD-10-CM | POA: Insufficient documentation

## 2013-05-06 DIAGNOSIS — E785 Hyperlipidemia, unspecified: Secondary | ICD-10-CM | POA: Insufficient documentation

## 2013-05-06 DIAGNOSIS — E669 Obesity, unspecified: Secondary | ICD-10-CM | POA: Insufficient documentation

## 2013-05-06 DIAGNOSIS — M7660 Achilles tendinitis, unspecified leg: Secondary | ICD-10-CM | POA: Insufficient documentation

## 2013-05-06 DIAGNOSIS — M7662 Achilles tendinitis, left leg: Secondary | ICD-10-CM

## 2013-05-06 DIAGNOSIS — K219 Gastro-esophageal reflux disease without esophagitis: Secondary | ICD-10-CM | POA: Insufficient documentation

## 2013-05-06 DIAGNOSIS — I129 Hypertensive chronic kidney disease with stage 1 through stage 4 chronic kidney disease, or unspecified chronic kidney disease: Secondary | ICD-10-CM | POA: Insufficient documentation

## 2013-05-06 HISTORY — PX: GASTROCNEMIUS RECESSION: SHX863

## 2013-05-06 HISTORY — PX: EXCISION HAGLUND'S DEFORMITY WITH ACHILLES TENDON REPAIR: SHX5627

## 2013-05-06 HISTORY — DX: Unspecified osteoarthritis, unspecified site: M19.90

## 2013-05-06 LAB — POCT HEMOGLOBIN-HEMACUE: Hemoglobin: 13.9 g/dL (ref 12.0–15.0)

## 2013-05-06 LAB — GLUCOSE, CAPILLARY
Glucose-Capillary: 108 mg/dL — ABNORMAL HIGH (ref 70–99)
Glucose-Capillary: 148 mg/dL — ABNORMAL HIGH (ref 70–99)

## 2013-05-06 SURGERY — EXCISION HAGLUND'S DEFORMITY WITH ACHILLES TENDON REPAIR
Anesthesia: General | Site: Leg Lower | Laterality: Left

## 2013-05-06 MED ORDER — BUPIVACAINE HCL (PF) 0.5 % IJ SOLN
INTRAMUSCULAR | Status: AC
  Filled 2013-05-06: qty 30

## 2013-05-06 MED ORDER — BUPIVACAINE-EPINEPHRINE PF 0.5-1:200000 % IJ SOLN
INTRAMUSCULAR | Status: DC | PRN
  Administered 2013-05-06: 20 mL via PERINEURAL

## 2013-05-06 MED ORDER — HYDROMORPHONE HCL PF 1 MG/ML IJ SOLN: 0.2500 mg | INTRAMUSCULAR | Status: DC | PRN

## 2013-05-06 MED ORDER — OXYCODONE HCL 5 MG/5ML PO SOLN: 5.0000 mg | Freq: Once | ORAL | Status: AC | PRN

## 2013-05-06 MED ORDER — OXYCODONE HCL 5 MG PO TABS
5.0000 mg | ORAL_TABLET | Freq: Once | ORAL | Status: AC | PRN
  Administered 2013-05-06: 5 mg via ORAL
  Filled 2013-05-06: qty 1

## 2013-05-06 MED ORDER — FENTANYL CITRATE 0.05 MG/ML IJ SOLN
INTRAMUSCULAR | Status: AC
  Filled 2013-05-06: qty 6

## 2013-05-06 MED ORDER — DEXAMETHASONE SODIUM PHOSPHATE 4 MG/ML IJ SOLN
INTRAMUSCULAR | Status: DC | PRN
  Administered 2013-05-06: 10 mg via INTRAVENOUS

## 2013-05-06 MED ORDER — CEFAZOLIN SODIUM-DEXTROSE 2-3 GM-% IV SOLR
2.0000 g | INTRAVENOUS | Status: AC
  Administered 2013-05-06: 2 g via INTRAVENOUS

## 2013-05-06 MED ORDER — ONDANSETRON HCL 4 MG/2ML IJ SOLN
INTRAMUSCULAR | Status: DC | PRN
  Administered 2013-05-06: 4 mg via INTRAVENOUS

## 2013-05-06 MED ORDER — SUCCINYLCHOLINE CHLORIDE 20 MG/ML IJ SOLN
INTRAMUSCULAR | Status: DC | PRN
  Administered 2013-05-06: 100 mg via INTRAVENOUS

## 2013-05-06 MED ORDER — BACITRACIN ZINC 500 UNIT/GM EX OINT
TOPICAL_OINTMENT | CUTANEOUS | Status: AC
  Filled 2013-05-06: qty 28.35

## 2013-05-06 MED ORDER — CHLORHEXIDINE GLUCONATE 4 % EX LIQD: 60.0000 mL | Freq: Once | CUTANEOUS | Status: DC

## 2013-05-06 MED ORDER — MIDAZOLAM HCL 2 MG/2ML IJ SOLN
INTRAMUSCULAR | Status: AC
  Filled 2013-05-06: qty 2

## 2013-05-06 MED ORDER — ACETAMINOPHEN 500 MG PO TABS: 1000.0000 mg | ORAL_TABLET | Freq: Once | ORAL | Status: DC

## 2013-05-06 MED ORDER — ONDANSETRON HCL 4 MG/2ML IJ SOLN: 4.0000 mg | Freq: Once | INTRAMUSCULAR | Status: DC | PRN

## 2013-05-06 MED ORDER — FENTANYL CITRATE 0.05 MG/ML IJ SOLN
50.0000 ug | INTRAMUSCULAR | Status: DC | PRN
  Administered 2013-05-06: 100 ug via INTRAVENOUS

## 2013-05-06 MED ORDER — FENTANYL CITRATE 0.05 MG/ML IJ SOLN
INTRAMUSCULAR | Status: AC
  Filled 2013-05-06: qty 2

## 2013-05-06 MED ORDER — LIDOCAINE HCL (CARDIAC) 20 MG/ML IV SOLN
INTRAVENOUS | Status: DC | PRN
  Administered 2013-05-06: 30 mg via INTRAVENOUS

## 2013-05-06 MED ORDER — LACTATED RINGERS IV SOLN: INTRAVENOUS | Status: DC

## 2013-05-06 MED ORDER — CEFAZOLIN SODIUM-DEXTROSE 2-3 GM-% IV SOLR
INTRAVENOUS | Status: AC
  Filled 2013-05-06: qty 50

## 2013-05-06 MED ORDER — PROPOFOL 10 MG/ML IV BOLUS
INTRAVENOUS | Status: DC | PRN
  Administered 2013-05-06: 200 mg via INTRAVENOUS

## 2013-05-06 MED ORDER — MIDAZOLAM HCL 2 MG/2ML IJ SOLN
1.0000 mg | INTRAMUSCULAR | Status: DC | PRN
  Administered 2013-05-06: 2 mg via INTRAVENOUS

## 2013-05-06 MED ORDER — ROPIVACAINE HCL 5 MG/ML IJ SOLN
INTRAMUSCULAR | Status: DC | PRN
  Administered 2013-05-06: 20 mL via PERINEURAL

## 2013-05-06 MED ORDER — SUCCINYLCHOLINE CHLORIDE 20 MG/ML IJ SOLN
INTRAMUSCULAR | Status: AC
  Filled 2013-05-06: qty 1

## 2013-05-06 MED ORDER — SODIUM CHLORIDE 0.9 % IV SOLN: INTRAVENOUS | Status: DC

## 2013-05-06 MED ORDER — BACITRACIN ZINC 500 UNIT/GM EX OINT
TOPICAL_OINTMENT | CUTANEOUS | Status: DC | PRN
  Administered 2013-05-06: 1 via TOPICAL

## 2013-05-06 MED ORDER — BUPIVACAINE-EPINEPHRINE PF 0.5-1:200000 % IJ SOLN
INTRAMUSCULAR | Status: AC
  Filled 2013-05-06: qty 30

## 2013-05-06 MED ORDER — 0.9 % SODIUM CHLORIDE (POUR BTL) OPTIME
TOPICAL | Status: DC | PRN
  Administered 2013-05-06: 300 mL

## 2013-05-06 MED ORDER — FENTANYL CITRATE 0.05 MG/ML IJ SOLN
INTRAMUSCULAR | Status: DC | PRN
  Administered 2013-05-06: 100 ug via INTRAVENOUS

## 2013-05-06 MED ORDER — LACTATED RINGERS IV SOLN
INTRAVENOUS | Status: DC | PRN
  Administered 2013-05-06 (×2): via INTRAVENOUS

## 2013-05-06 MED ORDER — MIDAZOLAM HCL 5 MG/5ML IJ SOLN
INTRAMUSCULAR | Status: DC | PRN
  Administered 2013-05-06: 1 mg via INTRAVENOUS

## 2013-05-06 MED ORDER — OXYCODONE HCL 5 MG PO TABS: 5.0000 mg | ORAL_TABLET | ORAL | Status: DC | PRN

## 2013-05-06 SURGICAL SUPPLY — 85 items
BANDAGE ESMARK 6X9 LF (GAUZE/BANDAGES/DRESSINGS) ×2 IMPLANT
BLADE AVERAGE 25X9 (BLADE) ×1 IMPLANT
BLADE MICRO SAGITTAL (BLADE) ×2 IMPLANT
BLADE SURG 15 STRL LF DISP TIS (BLADE) ×4 IMPLANT
BLADE SURG 15 STRL SS (BLADE) ×15
BNDG CMPR 9X6 STRL LF SNTH (GAUZE/BANDAGES/DRESSINGS) ×2
BNDG COHESIVE 4X5 TAN STRL (GAUZE/BANDAGES/DRESSINGS) ×3 IMPLANT
BNDG COHESIVE 6X5 TAN STRL LF (GAUZE/BANDAGES/DRESSINGS) ×3 IMPLANT
BNDG ESMARK 6X9 LF (GAUZE/BANDAGES/DRESSINGS) ×3
BOOT STEPPER DURA MED (SOFTGOODS) IMPLANT
CANISTER SUCT 1200ML W/VALVE (MISCELLANEOUS) IMPLANT
CHLORAPREP W/TINT 26ML (MISCELLANEOUS) ×3 IMPLANT
COVER TABLE BACK 60X90 (DRAPES) ×3 IMPLANT
CUFF TOURNIQUET SINGLE 34IN LL (TOURNIQUET CUFF) ×3 IMPLANT
DRAPE EXTREMITY T 121X128X90 (DRAPE) ×3 IMPLANT
DRAPE OEC MINIVIEW 54X84 (DRAPES) IMPLANT
DRAPE U-SHAPE 47X51 STRL (DRAPES) ×3 IMPLANT
DRSG EMULSION OIL 3X3 NADH (GAUZE/BANDAGES/DRESSINGS) ×3 IMPLANT
DURA STEPPER LG (CAST SUPPLIES) IMPLANT
ELECT REM PT RETURN 9FT ADLT (ELECTROSURGICAL) ×3
ELECTRODE REM PT RTRN 9FT ADLT (ELECTROSURGICAL) ×2 IMPLANT
GLOVE BIO SURGEON STRL SZ8 (GLOVE) ×3 IMPLANT
GLOVE BIOGEL PI IND STRL 7.0 (GLOVE) ×1 IMPLANT
GLOVE BIOGEL PI IND STRL 7.5 (GLOVE) ×2 IMPLANT
GLOVE BIOGEL PI IND STRL 8 (GLOVE) ×2 IMPLANT
GLOVE BIOGEL PI INDICATOR 7.0 (GLOVE) ×1
GLOVE BIOGEL PI INDICATOR 7.5 (GLOVE) ×1
GLOVE BIOGEL PI INDICATOR 8 (GLOVE) ×1
GLOVE ECLIPSE 6.5 STRL STRAW (GLOVE) ×1 IMPLANT
GLOVE ECLIPSE 7.0 STRL STRAW (GLOVE) ×3 IMPLANT
GLOVE EXAM NITRILE MD LF STRL (GLOVE) ×2 IMPLANT
GOWN STRL REUS W/ TWL LRG LVL3 (GOWN DISPOSABLE) ×4 IMPLANT
GOWN STRL REUS W/ TWL XL LVL3 (GOWN DISPOSABLE) ×2 IMPLANT
GOWN STRL REUS W/TWL LRG LVL3 (GOWN DISPOSABLE) ×6
GOWN STRL REUS W/TWL XL LVL3 (GOWN DISPOSABLE) ×3
KIT BIO-TENODESIS 3X8 DISP (MISCELLANEOUS)
KIT INSRT BABSR STRL DISP BTN (MISCELLANEOUS) IMPLANT
NDL HYPO 25X1 1.5 SAFETY (NEEDLE) IMPLANT
NDL SAFETY ECLIPSE 18X1.5 (NEEDLE) IMPLANT
NEEDLE HYPO 18GX1.5 SHARP (NEEDLE)
NEEDLE HYPO 22GX1.5 SAFETY (NEEDLE) IMPLANT
NEEDLE HYPO 25X1 1.5 SAFETY (NEEDLE) IMPLANT
NS IRRIG 1000ML POUR BTL (IV SOLUTION) ×3 IMPLANT
PACK ACHILLES SUTUREBRIDGE (Anchor) ×1 IMPLANT
PACK BASIN DAY SURGERY FS (CUSTOM PROCEDURE TRAY) ×3 IMPLANT
PAD ABD 8X10 STRL (GAUZE/BANDAGES/DRESSINGS) ×6 IMPLANT
PAD CAST 4YDX4 CTTN HI CHSV (CAST SUPPLIES) ×4 IMPLANT
PADDING CAST ABS 4INX4YD NS (CAST SUPPLIES) ×1
PADDING CAST ABS COTTON 4X4 ST (CAST SUPPLIES) ×1 IMPLANT
PADDING CAST COTTON 4X4 STRL (CAST SUPPLIES) ×6
PADDING CAST COTTON 6X4 STRL (CAST SUPPLIES) ×3 IMPLANT
PENCIL BUTTON HOLSTER BLD 10FT (ELECTRODE) ×3 IMPLANT
SANITIZER HAND PURELL 535ML FO (MISCELLANEOUS) ×3 IMPLANT
SHEET MEDIUM DRAPE 40X70 STRL (DRAPES) ×3 IMPLANT
SLEEVE SCD COMPRESS KNEE MED (MISCELLANEOUS) ×3 IMPLANT
SPLINT FAST PLASTER 5X30 (CAST SUPPLIES) ×20
SPLINT PLASTER CAST FAST 5X30 (CAST SUPPLIES) ×40 IMPLANT
SPONGE GAUZE 4X4 12PLY (GAUZE/BANDAGES/DRESSINGS) ×3 IMPLANT
SPONGE LAP 18X18 X RAY DECT (DISPOSABLE) ×3 IMPLANT
STAPLER VISISTAT 35W (STAPLE) IMPLANT
STOCKINETTE 6  STRL (DRAPES) ×1
STOCKINETTE 6 STRL (DRAPES) ×2 IMPLANT
STRIP CLOSURE SKIN 1/2X4 (GAUZE/BANDAGES/DRESSINGS) IMPLANT
SUCTION FRAZIER TIP 10 FR DISP (SUCTIONS) IMPLANT
SUT ETHIBOND 2 OS 4 DA (SUTURE) IMPLANT
SUT ETHIBOND 3-0 V-5 (SUTURE) IMPLANT
SUT ETHILON 3 0 PS 1 (SUTURE) ×4 IMPLANT
SUT FIBERWIRE #2 38 T-5 BLUE (SUTURE)
SUT MNCRL AB 3-0 PS2 18 (SUTURE) ×3 IMPLANT
SUT MNCRL AB 4-0 PS2 18 (SUTURE) IMPLANT
SUT VIC AB 0 CT1 27 (SUTURE)
SUT VIC AB 0 CT1 27XBRD ANBCTR (SUTURE) IMPLANT
SUT VIC AB 0 SH 27 (SUTURE) IMPLANT
SUT VIC AB 2-0 SH 18 (SUTURE) IMPLANT
SUT VIC AB 2-0 SH 27 (SUTURE) ×3
SUT VIC AB 2-0 SH 27XBRD (SUTURE) ×1 IMPLANT
SUT VICRYL 4-0 PS2 18IN ABS (SUTURE) IMPLANT
SUTURE FIBERWR #2 38 T-5 BLUE (SUTURE) IMPLANT
SYR BULB 3OZ (MISCELLANEOUS) ×3 IMPLANT
SYR CONTROL 10ML LL (SYRINGE) IMPLANT
TOWEL OR 17X24 6PK STRL BLUE (TOWEL DISPOSABLE) ×5 IMPLANT
TOWEL OR NON WOVEN STRL DISP B (DISPOSABLE) ×3 IMPLANT
TUBE CONNECTING 20X1/4 (TUBING) IMPLANT
UNDERPAD 30X30 INCONTINENT (UNDERPADS AND DIAPERS) ×3 IMPLANT
YANKAUER SUCT BULB TIP NO VENT (SUCTIONS) IMPLANT

## 2013-05-06 NOTE — Discharge Instructions (Addendum)
Jessica Hewitt, MD °[facility] ° °Please read the following information regarding your care after surgery. ° °Medications  °You only need a prescription for the narcotic pain medicine (ex. oxycodone, Percocet, Norco).  All of the other medicines listed below are available over the counter. °X acetominophen (Tylenol) 650 mg every 4-6 hours as you need for minor pain °X oxycodone as prescribed for moderate to severe pain °  ° °Narcotic pain medicine (ex. oxycodone, Percocet, Vicodin) will cause constipation.  To prevent this problem, take the following medicines while you are taking any pain medicine. °X docusate sodium (Colace) 100 mg twice a day X senna (Senokot) 2 tablets twice a day ° °X To help prevent blood clots, take an aspirin (325 mg) once a day for a month after surgery.  You should also get up every hour while you are awake to move around.   ° °Weight Bearing °X Do not bear any weight on the operated leg or foot. ° °Cast / Splint / Dressing °X Keep your splint or cast clean and dry.  Don’t put anything (coat hanger, pencil, etc) down inside of it.  If it gets damp, use a hair dryer on the cool setting to dry it.  If it gets soaked, call the office to schedule an appointment for a cast change. °.   °  °After your dressing, cast or splint is removed; you may shower, but do not soak or scrub the wound.  Allow the water to run over it, and then gently pat it dry. ° °Swelling °It is normal for you to have swelling where you had surgery.  To reduce swelling and pain, keep your toes above your nose for at least 3 days after surgery.  It may be necessary to keep your foot or leg elevated for several weeks.  If it hurts, it should be elevated. ° °Follow Up °Call my office at [phone] when you are discharged from the hospital or surgery center to schedule an appointment to be seen two weeks after surgery. ° °Call my office at [phone] if you develop a fever >101.5° F, nausea, vomiting, bleeding  from the surgical site or severe pain.   ° ° °Regional Anesthesia Blocks ° °1. Numbness or the inability to move the "blocked" extremity may last from 3-48 hours after placement. The length of time depends on the medication injected and your individual response to the medication. If the numbness is not going away after 48 hours, call your surgeon. ° °2. The extremity that is blocked will need to be protected until the numbness is gone and the  Strength has returned. Because you cannot feel it, you will need to take extra care to avoid injury. Because it may be weak, you may have difficulty moving it or using it. You may not know what position it is in without looking at it while the block is in effect. ° °3. For blocks in the legs and feet, returning to weight bearing and walking needs to be done carefully. You will need to wait until the numbness is entirely gone and the strength has returned. You should be able to move your leg and foot normally before you try and bear weight or walk. You will need someone to be with you when you first try to ensure you do not fall and possibly risk injury. ° °4. Bruising and tenderness at the needle site are common side effects and will resolve in a few days. ° °5. Persistent numbness or   new problems with movement should be communicated to the surgeon or the [facility] ([phone])/ [facility] ([phone]). ° ° °Post Anesthesia Home Care Instructions ° °Activity: °Get plenty of rest for the remainder of the day. A responsible adult should stay with you for 24 hours following the procedure.  °For the next 24 hours, DO NOT: °-Drive a car °-Operate machinery °-Drink alcoholic beverages °-Take any medication unless instructed by your physician °-Make any legal decisions or sign important papers. ° °Meals: °Start with liquid foods such as gelatin or soup. Progress to regular foods as tolerated. Avoid greasy, spicy, heavy foods. If nausea and/or vomiting  occur, drink only clear liquids until the nausea and/or vomiting subsides. Call your physician if vomiting continues. ° °Special Instructions/Symptoms: °Your throat may feel dry or sore from the anesthesia or the breathing tube placed in your throat during surgery. If this causes discomfort, gargle with warm salt water. The discomfort should disappear within 24 hours. ° °

## 2013-05-06 NOTE — Brief Op Note (Signed)
05/06/2013  10:47 AM  PATIENT:  Jessica Bowers  64 y.o. female  PRE-OPERATIVE DIAGNOSIS:  Left insertional achilles tendopathy and tight heel cord; haglund deformity  POST-OPERATIVE DIAGNOSIS:  same  Procedure(s): 1.  Left achilles tendon debridement and reconstruction 2.  Left gastrocnemius recession 3.  Left Haglund deformity excision  SURGEON:  Wylene Simmer, MD  ASSISTANT: Nicki Reaper Flowers, PA-C  ANESTHESIA:   General, regional  EBL:  minimal   TOURNIQUET:   Total Tourniquet Time Documented: Thigh (Left) - 62 minutes Total: Thigh (Left) - 62 minutes   COMPLICATIONS:  None apparent  DISPOSITION:  Extubated, awake and stable to recovery.  DICTATION ID:  [id number]

## 2013-05-06 NOTE — Anesthesia Postprocedure Evaluation (Signed)
  Anesthesia Post-op Note  Patient: Jessica Bowers  Procedure(s) Performed: Procedure(s): LEFT ACHILLES DEBRIDEMENT AND RESECTION / HAGLUND'S EXCISION (Left) LEFT GASTROC RECESSION (Left)  Patient Location: PACU  Anesthesia Type:GA combined with regional for post-op pain  Level of Consciousness: awake, alert  and oriented  Airway and Oxygen Therapy: Patient Spontanous Breathing and Patient connected to nasal cannula oxygen  Post-op Pain: none  Post-op Assessment: Post-op Vital signs reviewed  Post-op Vital Signs: Reviewed  Complications: No apparent anesthesia complications

## 2013-05-06 NOTE — Anesthesia Preprocedure Evaluation (Signed)
Anesthesia Evaluation  Patient identified by MRN, date of birth, ID band Patient awake    Reviewed: Allergy & Precautions, H&P , NPO status , Patient's Chart, lab work & pertinent test results, reviewed documented beta blocker date and time   Airway Mallampati: I TM Distance: >3 FB Neck ROM: Full    Dental  (+) Teeth Intact and Dental Advisory Given   Pulmonary  breath sounds clear to auscultation        Cardiovascular hypertension, Pt. on medications and Pt. on home beta blockers Rhythm:Regular Rate:Normal     Neuro/Psych    GI/Hepatic GERD-  Medicated and Controlled,  Endo/Other  diabetes, Well Controlled, Type 2, Oral Hypoglycemic AgentsMorbid obesity  Renal/GU      Musculoskeletal   Abdominal   Peds  Hematology   Anesthesia Other Findings   Reproductive/Obstetrics                           Anesthesia Physical Anesthesia Plan  ASA: III  Anesthesia Plan: General   Post-op Pain Management:    Induction: Intravenous  Airway Management Planned: Oral ETT  Additional Equipment:   Intra-op Plan:   Post-operative Plan: Extubation in OR  Informed Consent: I have reviewed the patients History and Physical, chart, labs and discussed the procedure including the risks, benefits and alternatives for the proposed anesthesia with the patient or authorized representative who has indicated his/her understanding and acceptance.   Dental advisory given  Plan Discussed with: CRNA, Anesthesiologist and Surgeon  Anesthesia Plan Comments:         Anesthesia Quick Evaluation

## 2013-05-06 NOTE — Progress Notes (Addendum)
Assisted Dr. Crews with left, ultrasound guided, popliteal block. Side rails up, monitors on throughout procedure. See vital signs in flow sheet. Tolerated Procedure well. 

## 2013-05-06 NOTE — Anesthesia Procedure Notes (Addendum)
Anesthesia Regional Block:  Popliteal block  Pre-Anesthetic Checklist: ,, timeout performed, Correct Patient, Correct Site, Correct Laterality, Correct Procedure, Correct Position, site marked, Risks and benefits discussed,  Surgical consent,  Pre-op evaluation,  At surgeon's request and post-op pain management  Laterality: Lower  Prep: chloraprep       Needles:  Injection technique: Single-shot  Needle Type: Echogenic Needle     Needle Length: 9cm  Needle Gauge: 21 and 21 G    Additional Needles:  Procedures: ultrasound guided (picture in chart) Popliteal block Narrative:  Start time: 05/06/2013 8:35 AM End time: 05/06/2013 8:45 AM Injection made incrementally with aspirations every 5 mL.  Performed by: Personally  Anesthesiologist: Lorrene Reid, MD   Anesthesia Regional Block:  Adductor canal block  Pre-Anesthetic Checklist: ,, timeout performed, Correct Patient, Correct Site, Correct Laterality, Correct Procedure, Correct Position, site marked, Risks and benefits discussed,  Surgical consent,  Pre-op evaluation,  At surgeon's request and post-op pain management  Laterality: Left and Lower  Prep: chloraprep       Needles:  Injection technique: Single-shot  Needle Type: Echogenic Needle     Needle Length: 9cm  Needle Gauge: 21 and 21 G    Additional Needles:  Procedures: ultrasound guided (picture in chart) Adductor canal block Narrative:  Start time: 05/06/2013 8:45 AM End time: 05/06/2013 9:47 AM Injection made incrementally with aspirations every 5 mL.  Performed by: Personally  Anesthesiologist: Lorrene Reid, MD   Procedure Name: Intubation Date/Time: 05/06/2013 9:19 AM Performed by: BLOCKER, TIMOTHY Pre-anesthesia Checklist: Patient identified, Emergency Drugs available, Suction available and Patient being monitored Patient Re-evaluated:Patient Re-evaluated prior to inductionOxygen Delivery Method: Circle System Utilized Preoxygenation:  Pre-oxygenation with 100% oxygen Intubation Type: IV induction Ventilation: Mask ventilation without difficulty Laryngoscope Size: Mac and 3 Grade View: Grade III Tube type: Oral Tube size: 7.0 mm Number of attempts: 1 Airway Equipment and Method: stylet,  LTA kit utilized and Bougie stylet Placement Confirmation: ETT inserted through vocal cords under direct vision,  positive ETCO2 and breath sounds checked- equal and bilateral Secured at: 21 cm Tube secured with: Tape Dental Injury: Teeth and Oropharynx as per pre-operative assessment  Comments: DL with Mac 3, small mouth with limited oral opening, slight anterior, epiglottis visualized but unable to visualize VC, blue stylet utilized without complication, + ETCO2, bilateral chest rise, dentition unchanged

## 2013-05-06 NOTE — Transfer of Care (Signed)
Immediate Anesthesia Transfer of Care Note  Patient: Jessica Bowers  Procedure(s) Performed: Procedure(s): LEFT ACHILLES DEBRIDEMENT AND RESECTION / HAGLUND'S EXCISION (Left) LEFT GASTROC RECESSION (Left)  Patient Location: PACU  Anesthesia Type:GA combined with regional for post-op pain  Level of Consciousness: awake, alert , oriented and patient cooperative  Airway & Oxygen Therapy: Patient Spontanous Breathing and Patient connected to face mask oxygen  Post-op Assessment: Report given to PACU RN and Post -op Vital signs reviewed and stable  Post vital signs: Reviewed and stable  Complications: No apparent anesthesia complications

## 2013-05-06 NOTE — Addendum Note (Signed)
Addendum created 05/06/13 1332 by Napoleon Form, MD   Modules edited: Anesthesia Blocks and Procedures

## 2013-05-07 ENCOUNTER — Encounter (HOSPITAL_BASED_OUTPATIENT_CLINIC_OR_DEPARTMENT_OTHER): Payer: Self-pay | Admitting: Orthopedic Surgery

## 2013-05-07 NOTE — Op Note (Signed)
NAMEKAYLE, PASSARELLI              ACCOUNT NO.:  [id number]  MEDICAL RECORD NO.:  [id number]  LOCATION:                                 FACILITY:  PHYSICIAN:  Wylene Simmer, MD        DATE OF BIRTH:  1950/12/26  DATE OF PROCEDURE:  05/06/2013 DATE OF DISCHARGE:                              OPERATIVE REPORT   PREOPERATIVE DIAGNOSES: 1. Left Achilles insertional tendinopathy. 2. Tight left heel cord. 3. Left Haglund deformity.  POSTOPERATIVE DIAGNOSES: 1. Left Achilles insertional tendinopathy. 2. Tight left heel cord. 3. Left Haglund deformity.  PROCEDURES: 1. Left Achilles tendon debridement and reconstruction. 2. Left gastrocnemius recession through a separate incision. 3. Excision of left calcaneus Haglund deformity.  SURGEON:  Wylene Simmer, MD  ASSISTANT:  Nicki Reaper Flowers, PA-C.  ANESTHESIA:  General, regional.  ESTIMATED BLOOD LOSS:  Minimal.  TOURNIQUET TIME:  62 minutes at 350 mmHg.  COMPLICATIONS:  None apparent.  DISPOSITION:  Extubated awake and stable to recovery.  INDICATIONS FOR PROCEDURE:  The patient is a 63 year old woman with a long history of left heel pain.  She has failed nonoperative treatment including activity modification, oral anti-inflammatories, immobilization, and physical therapy.  She presents now for operative treatment of this painful limiting condition.  She understands the risks and benefits, the alternative treatment options, and elects surgical treatment.  She specifically understands risks of bleeding, infection, nerve damage, blood clots, need for additional surgery, amputation, and death.  PROCEDURE IN DETAIL:  After preoperative consent was obtained, the correct operative site was identified, the patient brought to the operating room supine on a gurney.  General anesthesia was induced. Preoperative antibiotics were administered.  Surgical time-out was taken.  Left lower extremity was exsanguinated and a thigh tourniquet was  inflated to 300 mmHg.  The patient was then turned into the prone position on the operating table.  The left lower extremity was prepped and draped in standard sterile fashion.  A longitudinal incision was made over the gastrocnemius insertion.  Sharp dissection was carried down through the skin and subcutaneous tissue.  Care was taken to protect the sural nerve and lesser saphenous vein.  The gastrocnemius tendon was divided under direct vision in its entirety.  The wound was irrigated copiously.  Inverted simple sutures of 3-0 Monocryl used to close subcutaneous tissue and a running 3-0 nylon was used to close the skin incision.  Attention was then turned to the posterior aspect of the heel where longitudinal incision was made.  Sharp dissection was carried down through skin and subcutaneous tissue and peritenon.  The tendon was split in line with its fibers and the splint was extended distally to the insertion on the calcaneus.  The tendon was then released from its insertion on the calcaneus exposing the large posterior enthesophyte and Haglund deformity.  Care was taken to protect the tendon while an oscillating saw was used to resect the insertional enthesophyte and the Haglund deformity.  Tendon was then debrided of all degenerative appearing tissue.  This removed less than 50% of the cross-sectional area of the tendon.  Wound was irrigated copiously.  The Arthrex suture bridge construct was then used to repair  the tendon back down to the cut surface of the bone using 4 anchors in an hour glass pattern of suture. After the repair, no gapping was noted with plantar flexion to neutral. The wound was irrigated.  The peritenon was repaired over the tendon with simple sutures of 3-0 Monocryl.  The deep subcutaneous tissues were approximated with inverted simple sutures of 2-0 Vicryl.  The skin was closed with a running 3-0 nylon suture as well some horizontal mattress sutures of 3-0  nylon.  Sterile dressings were applied followed by a well- padded short-leg splint.  Tourniquet was released at 1 hour and 2 minutes after application of the dressings.  The patient was then awakened from anesthesia and transported to the recovery room in stable condition.  FOLLOWUP PLAN:  The patient will be nonweightbearing on the left lower extremity until she follows up with me in clinic in 2 weeks.  At that point, we will remove her sutures and get her weightbearing in a CAM boot with a heel lift.  She will take aspirin for DVT prophylaxis.  Scott Flowers, PA-C was present and scrubbed for the duration of the case.  His assistance was essential for gaining and maintaining exposure, perform the operation, closing and dressing the wounds, and applying the splint.     Wylene Simmer, MD     JH/MEDQ  D:  05/06/2013  T:  05/07/2013  Job:  [id number]

## 2013-05-07 NOTE — Progress Notes (Signed)
Follow up phone call to Orrin Brigham - she states she had black areas under her eyes and red rash on face.  She stated she was starting to itch. Explained I would have Dr. Al Corpus call her because I was concerned about the bruising and rash on face. Dr. Al Corpus called and spoke with Bonnita Nasuti and told her this came from positioning during surgery. She told him she bruises easily - he told her they taped her eyes during surgery and explained it should be better in a few days if not to call him.

## 2013-06-24 ENCOUNTER — Ambulatory Visit: Payer: 59 | Attending: Orthopedic Surgery

## 2013-06-24 DIAGNOSIS — M25579 Pain in unspecified ankle and joints of unspecified foot: Secondary | ICD-10-CM | POA: Insufficient documentation

## 2013-06-24 DIAGNOSIS — M6281 Muscle weakness (generalized): Secondary | ICD-10-CM | POA: Insufficient documentation

## 2013-06-24 DIAGNOSIS — IMO0001 Reserved for inherently not codable concepts without codable children: Secondary | ICD-10-CM | POA: Insufficient documentation

## 2013-06-24 DIAGNOSIS — M25676 Stiffness of unspecified foot, not elsewhere classified: Secondary | ICD-10-CM | POA: Insufficient documentation

## 2013-06-24 DIAGNOSIS — R269 Unspecified abnormalities of gait and mobility: Secondary | ICD-10-CM | POA: Insufficient documentation

## 2013-06-24 DIAGNOSIS — M25673 Stiffness of unspecified ankle, not elsewhere classified: Secondary | ICD-10-CM | POA: Insufficient documentation

## 2013-06-30 ENCOUNTER — Ambulatory Visit: Payer: 59 | Admitting: Physical Therapy

## 2013-07-02 ENCOUNTER — Ambulatory Visit: Payer: 59 | Admitting: Physical Therapy

## 2013-07-05 ENCOUNTER — Ambulatory Visit: Payer: 59 | Admitting: Physical Therapy

## 2013-07-07 ENCOUNTER — Ambulatory Visit: Payer: 59 | Attending: Orthopedic Surgery | Admitting: Physical Therapy

## 2013-07-07 DIAGNOSIS — M25673 Stiffness of unspecified ankle, not elsewhere classified: Secondary | ICD-10-CM | POA: Insufficient documentation

## 2013-07-07 DIAGNOSIS — M6281 Muscle weakness (generalized): Secondary | ICD-10-CM | POA: Insufficient documentation

## 2013-07-07 DIAGNOSIS — M25676 Stiffness of unspecified foot, not elsewhere classified: Secondary | ICD-10-CM | POA: Insufficient documentation

## 2013-07-07 DIAGNOSIS — IMO0001 Reserved for inherently not codable concepts without codable children: Secondary | ICD-10-CM | POA: Insufficient documentation

## 2013-07-07 DIAGNOSIS — R269 Unspecified abnormalities of gait and mobility: Secondary | ICD-10-CM | POA: Insufficient documentation

## 2013-07-07 DIAGNOSIS — M25579 Pain in unspecified ankle and joints of unspecified foot: Secondary | ICD-10-CM | POA: Insufficient documentation

## 2013-07-12 ENCOUNTER — Ambulatory Visit: Payer: 59 | Admitting: Physical Therapy

## 2013-07-14 ENCOUNTER — Ambulatory Visit: Payer: 59 | Admitting: Physical Therapy

## 2013-07-19 ENCOUNTER — Ambulatory Visit: Payer: 59 | Admitting: Physical Therapy

## 2013-07-21 ENCOUNTER — Ambulatory Visit: Payer: 59

## 2013-07-26 ENCOUNTER — Ambulatory Visit: Payer: 59 | Admitting: Physical Therapy

## 2013-07-28 ENCOUNTER — Ambulatory Visit: Payer: 59

## 2013-08-03 ENCOUNTER — Ambulatory Visit: Payer: 59 | Admitting: Physical Therapy

## 2013-08-05 ENCOUNTER — Ambulatory Visit: Payer: 59

## 2013-08-09 ENCOUNTER — Ambulatory Visit: Payer: 59 | Attending: Orthopedic Surgery

## 2013-08-09 DIAGNOSIS — IMO0001 Reserved for inherently not codable concepts without codable children: Secondary | ICD-10-CM | POA: Insufficient documentation

## 2013-08-09 DIAGNOSIS — M6281 Muscle weakness (generalized): Secondary | ICD-10-CM | POA: Insufficient documentation

## 2013-08-09 DIAGNOSIS — M25676 Stiffness of unspecified foot, not elsewhere classified: Secondary | ICD-10-CM | POA: Insufficient documentation

## 2013-08-09 DIAGNOSIS — M25673 Stiffness of unspecified ankle, not elsewhere classified: Secondary | ICD-10-CM | POA: Insufficient documentation

## 2013-08-09 DIAGNOSIS — M25579 Pain in unspecified ankle and joints of unspecified foot: Secondary | ICD-10-CM | POA: Insufficient documentation

## 2013-08-09 DIAGNOSIS — R269 Unspecified abnormalities of gait and mobility: Secondary | ICD-10-CM | POA: Insufficient documentation

## 2013-08-11 ENCOUNTER — Ambulatory Visit: Payer: 59 | Admitting: Physical Therapy

## 2013-08-16 ENCOUNTER — Ambulatory Visit: Payer: 59 | Admitting: Physical Therapy

## 2013-08-19 ENCOUNTER — Ambulatory Visit: Payer: 59 | Admitting: Physical Therapy

## 2013-08-23 ENCOUNTER — Ambulatory Visit: Payer: 59 | Admitting: Physical Therapy

## 2013-08-25 ENCOUNTER — Ambulatory Visit (INDEPENDENT_AMBULATORY_CARE_PROVIDER_SITE_OTHER): Payer: 59 | Admitting: Cardiology

## 2013-08-25 ENCOUNTER — Encounter: Payer: Self-pay | Admitting: Cardiology

## 2013-08-25 VITALS — BP 126/67 | HR 66 | Ht 67.0 in | Wt 243.0 lb

## 2013-08-25 DIAGNOSIS — E119 Type 2 diabetes mellitus without complications: Secondary | ICD-10-CM

## 2013-08-25 DIAGNOSIS — E669 Obesity, unspecified: Secondary | ICD-10-CM

## 2013-08-25 DIAGNOSIS — I10 Essential (primary) hypertension: Secondary | ICD-10-CM

## 2013-08-25 DIAGNOSIS — E785 Hyperlipidemia, unspecified: Secondary | ICD-10-CM

## 2013-08-25 LAB — LIPID PANEL
CHOL/HDL RATIO: 2
Cholesterol: 142 mg/dL (ref 0–200)
HDL: 69.5 mg/dL (ref >39.00)
LDL CALC: 62 mg/dL (ref 0–99)
Triglycerides: 54 mg/dL (ref 0.0–149.0)
VLDL: 10.8 mg/dL (ref 0.0–40.0)

## 2013-08-25 LAB — ALT: ALT: 25 U/L (ref 0–35)

## 2013-08-25 NOTE — Patient Instructions (Signed)
Your physician recommends that you continue on your current medications as directed. Please refer to the Current Medication list given to you today.   Lab Today: Lipid, Alt  Your physician wants you to follow-up in: 1 year You will receive a reminder letter in the mail two months in advance. If you don't receive a letter, please call our office to schedule the follow-up appointment.

## 2013-08-25 NOTE — Addendum Note (Signed)
Addended by: Eulis Foster on: 08/25/2013 09:50 AM   Modules accepted: Orders

## 2013-08-25 NOTE — Progress Notes (Signed)
[facility]. [address], [address] [place] [address], [place]  [address] Phone: [phone] Fax:  [phone]  Date:  08/25/2013   ID:  Jessica Bowers, DOB 04/05/1951, MRN [id number]  PCP:  Delrae Rend, MD   History of Present Illness: Jessica Bowers is a 63 y.o. female here for follow up. Prior visit for left Achilles tendon repair by Dr. Georgena Spurling (Jan 29th).   Previously she was seen secondary to chest discomfort. She has a history of diabetes, hypertension. Previously she has had 4 separate episodes of chest discomfort/tightness including a 1-1/2 hour episode that began while sitting at her desk. She felt somewhat dyspneic with her symptoms. Felt like she needed to sit up straight to relieve. She "knows" it was not indigestion., Non exertional. Although Sunday, did feels some pain when cleaning out the attic. Subsided. 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 CKD. She took an aspirin which seemed to help. She has not tolerated statins in the past. Has diabetes, obesity, hypertension. Her last hemoglobin A1c was 6.6, LDL 119 EKG on 12/11/12 shows sinus bradycardia rate 56 with no other abnormalities.  Because of the chest pain, she underwent a nuclear stress test:9/14 NUC Mild area of reversible ischemia in the apex/distal anteroseptal wall. Normal ejection fraction. Overall low risk test. medical management. We had lengthy discussion about the test. Her defect could have been breast attenuation driven.  Rare palpitions.   Achilles tendon repair. Dr. Georgena Spurling. [place].  Wt Readings from Last 3 Encounters:  08/25/13 243 lb (110.224 kg)  05/06/13 238 lb (107.956 kg)  05/06/13 238 lb (107.956 kg)     Past Medical History  Diagnosis Date  . Obesity   . Hyperlipidemia   . Diabetes   . HTN (hypertension)   . GERD (gastroesophageal reflux disease)   . CKD (chronic kidney disease)   . Patellar tendinitis   . LEG PAIN, LEFT   . Arthritis     Past Surgical History  Procedure Laterality Date  .  Tonsillectomy    . Lasik    . Knee surgery  2005    right  . Colonoscopy    . Abdominal hysterectomy  1997  . Excision haglund's deformity with achilles tendon repair Left 05/06/2013    Procedure: LEFT ACHILLES DEBRIDEMENT AND RESECTION / HAGLUND'S EXCISION;  Surgeon: Wylene Simmer, MD;  Location: [place] [place];  Service: Orthopedics;  Laterality: Left;  . Gastrocnemius recession Left 05/06/2013    Procedure: LEFT GASTROC RECESSION;  Surgeon: Wylene Simmer, MD;  Location: [place];  Service: Orthopedics;  Laterality: Left;    Current Outpatient Prescriptions  Medication Sig Dispense Refill  . atenolol (TENORMIN) 50 MG tablet Take 50 mg by mouth daily.      Marland Kitchen atorvastatin (LIPITOR) 10 MG tablet Take 10 mg by mouth daily.       . cholecalciferol (VITAMIN D) 1000 UNITS tablet Take 1,000 Units by mouth daily.      Marland Kitchen GNP CINNAMON PO Take by mouth.      . linagliptin (TRADJENTA) 5 MG TABS tablet Take 5 mg by mouth daily.      Marland Kitchen losartan (COZAAR) 100 MG tablet Take 100 mg by mouth daily.      . Multiple Vitamin (MULTIVITAMIN) capsule Take 1 capsule by mouth daily.      . Omega 3 1200 MG CAPS Take by mouth.      . oxybutynin (DITROPAN-XL) 10 MG 24 hr tablet Take 10 mg by mouth at bedtime.      Marland Kitchen  vitamin B-12 (CYANOCOBALAMIN) 500 MCG tablet Take 500 mcg by mouth daily.       No current facility-administered medications for this visit.    Allergies:    Allergies  Allergen Reactions  . Ace Inhibitors Cough  . Crestor [Rosuvastatin] Other (See Comments)    Leg cramps  . Glucophage [Metformin] Other (See Comments)    headaches  . Nsaids Nausea And Vomiting    Social History:  The patient  reports that she has never smoked. She does not have any smokeless tobacco history on file. She reports that she drinks alcohol. She reports that she does not use illicit drugs.   ROS:  Please see the history of present illness.   No CAD, no bleeding, no orthopnea, no PND     PHYSICAL EXAM: VS:  BP 126/67  Pulse 66  Ht 5\' 7"  (1.702 m)  Wt 243 lb (110.224 kg)  BMI 38.05 kg/m2 Well nourished, well developed, in no acute distress HEENT: normal Neck: no JVD Cardiac:  normal S1, S2; RRR; no murmur Lungs:  clear to auscultation bilaterally, no wheezing, rhonchi or rales Abd: soft, nontender, no hepatomegalyOverweight Ext: no edemacompression hose.  Skin: warm and dry Neuro: no focal abnormalities noted  EKG:  Sinus rhythm, no other changes. Heart rate 61.     ASSESSMENT AND PLAN:  1. Recent nuclear stress test in September of 2014 was overall low risk showing only a mild apical/distal anteroseptal defect. We continued with medical management and she has not had any further significant chest pain episodes. Continue with beta blocker. 2. Chest pain-no further significant episodes. Continue with aggressive medical management. We once again discussed how diabetes is a coronary artery disease equivalent. 3. Obesity-continue to encourage weight loss. 4. Hyperlipidemia-continue with Lipitor 10 mg. LDL goal should be less than 100, approaching 70. She was at one point on 20 mg but began to have cramping in her legs. She backed down to 10 mg. She is doing well on this dosage. We will check lipids. 5. Diabetes-continuing to work with Dr. Buddy Duty. Discussed the importance of weight loss.  Signed, Candee Furbish, MD [facility]  08/25/2013 9:40 AM

## 2013-08-26 ENCOUNTER — Ambulatory Visit: Payer: 59 | Admitting: Physical Therapy

## 2013-08-31 ENCOUNTER — Ambulatory Visit: Payer: 59

## 2013-09-02 ENCOUNTER — Ambulatory Visit: Payer: 59

## 2013-10-15 ENCOUNTER — Encounter: Payer: Self-pay | Admitting: Vascular Surgery

## 2013-10-15 ENCOUNTER — Other Ambulatory Visit: Payer: Self-pay | Admitting: *Deleted

## 2013-10-15 DIAGNOSIS — I83893 Varicose veins of bilateral lower extremities with other complications: Secondary | ICD-10-CM

## 2013-11-05 ENCOUNTER — Other Ambulatory Visit (HOSPITAL_COMMUNITY): Payer: Self-pay | Admitting: Obstetrics and Gynecology

## 2013-11-08 ENCOUNTER — Ambulatory Visit (HOSPITAL_COMMUNITY)
Admission: RE | Admit: 2013-11-08 | Discharge: 2013-11-08 | Disposition: A | Discharge status: Home / Self Care | Payer: 59 | Source: Ambulatory Visit | Attending: Vascular Surgery | Admitting: Vascular Surgery

## 2013-11-08 ENCOUNTER — Encounter (INDEPENDENT_AMBULATORY_CARE_PROVIDER_SITE_OTHER): Payer: Self-pay

## 2013-11-08 DIAGNOSIS — I83893 Varicose veins of bilateral lower extremities with other complications: Secondary | ICD-10-CM | POA: Insufficient documentation

## 2013-11-11 ENCOUNTER — Other Ambulatory Visit (HOSPITAL_COMMUNITY): Payer: Self-pay | Admitting: Orthopedic Surgery

## 2013-11-11 DIAGNOSIS — M25561 Pain in right knee: Secondary | ICD-10-CM

## 2013-11-11 DIAGNOSIS — S83206A Unspecified tear of unspecified meniscus, current injury, right knee, initial encounter: Secondary | ICD-10-CM

## 2013-11-12 ENCOUNTER — Encounter: Payer: Self-pay | Admitting: Vascular Surgery

## 2013-11-15 ENCOUNTER — Encounter: Payer: Self-pay | Admitting: Vascular Surgery

## 2013-11-15 ENCOUNTER — Ambulatory Visit (INDEPENDENT_AMBULATORY_CARE_PROVIDER_SITE_OTHER): Payer: 59 | Admitting: Vascular Surgery

## 2013-11-15 VITALS — BP 144/72 | HR 64 | Resp 16 | Ht 66.0 in | Wt 248.0 lb

## 2013-11-15 DIAGNOSIS — I83893 Varicose veins of bilateral lower extremities with other complications: Secondary | ICD-10-CM | POA: Insufficient documentation

## 2013-11-15 NOTE — Progress Notes (Signed)
Subjective:     Patient ID: Jessica Bowers, female   DOB: September 09, 1950, 63 y.o.   MRN: [id number]  HPI this 63 year old female is evaluated for bilateral varicose veins with edema and pain. She has had this problem for many years and is treated with short-leg elastic compression stockings. She has no history of DVT, thrombophlebitis, stasis ulcers, or bleeding. She has bulging varicosities in the right calf and pain in both lower extremities which worsens as the day progresses. She has had no previous treatment or evaluation.  Past Medical History  Diagnosis Date  . Obesity   . Hyperlipidemia   . Diabetes   . HTN (hypertension)   . GERD (gastroesophageal reflux disease)   . CKD (chronic kidney disease)   . Patellar tendinitis   . LEG PAIN, LEFT   . Arthritis   . Varicose veins     History  Substance Use Topics  . Smoking status: Never Smoker   . Smokeless tobacco: Never Used  . Alcohol Use: Yes     Comment: occ    Family History  Problem Relation Age of Onset  . Hypertension Father   . Diabetes Father   . Hyperlipidemia Mother   . CAD Mother     Allergies  Allergen Reactions  . Ace Inhibitors Cough  . Crestor [Rosuvastatin] Other (See Comments)    Leg cramps  . Glucophage [Metformin] Other (See Comments)    headaches  . Nsaids Nausea And Vomiting    Current outpatient prescriptions:atenolol (TENORMIN) 50 MG tablet, Take 50 mg by mouth daily., Disp: , Rfl: ;  atorvastatin (LIPITOR) 10 MG tablet, Take 10 mg by mouth daily. , Disp: , Rfl: ;  cholecalciferol (VITAMIN D) 1000 UNITS tablet, Take 1,000 Units by mouth daily., Disp: , Rfl: ;  GNP CINNAMON PO, Take by mouth., Disp: , Rfl: ;  linagliptin (TRADJENTA) 5 MG TABS tablet, Take 5 mg by mouth daily., Disp: , Rfl:  losartan (COZAAR) 100 MG tablet, Take 100 mg by mouth daily., Disp: , Rfl: ;  Multiple Vitamin (MULTIVITAMIN) capsule, Take 1 capsule by mouth daily., Disp: , Rfl: ;  Omega 3 1200 MG CAPS, Take by mouth., Disp: ,  Rfl: ;  oxybutynin (DITROPAN-XL) 10 MG 24 hr tablet, Take 10 mg by mouth at bedtime., Disp: , Rfl: ;  vitamin B-12 (CYANOCOBALAMIN) 500 MCG tablet, Take 500 mcg by mouth daily., Disp: , Rfl:   BP 144/72  Pulse 64  Resp 16  Ht 5\' 6"  (1.676 m)  Wt 248 lb (112.492 kg)  BMI 40.05 kg/m2  Body mass index is 40.05 kg/(m^2).           Review of Systems denies chest pain but does complain of pain in legs with walking and lying flat, history of swelling and varicose veins. Patient also has history of type 2 diabetes mellitus and esophageal reflux was well his urinary incontinence. Other systems negative and a complete review of systems     Objective:   Physical Exam BP 144/72  Pulse 64  Resp 16  Ht 5\' 6"  (1.676 m)  Wt 248 lb (112.492 kg)  BMI 40.05 kg/m2  Gen.-alert and oriented x3 in no apparent distress HEENT normal for age Lungs no rhonchi or wheezing Cardiovascular regular rhythm no murmurs carotid pulses 3+ palpable no bruits audible Abdomen soft nontender no palpable masses-obese Musculoskeletal free of  major deformities Skin clear -no rashes Neurologic normal Lower extremities 3+ femoral and dorsalis pedis pulses palpable bilaterally with 1+ edema  bilaterally. Right calf is larger than left in circumference by 1-2 cm. Bulging varicosities medial calf on the right below the knee and great saphenous vein distribution. Spider veins and some reticular veins left medial thigh and medial calf no hyperpigmentation bilaterally or active ulceration.  Today I ordered bilateral venous duplex exam which I reviewed and interpreted. The patient has no DVT. She has gross reflux in bilateral right saphenous vein from the mid calf to the saphenofemoral junction.        Assessment:     Gross reflux bilateral grade saphenous veins with painful varicosities and edema    Plan:         #1 long leg elastic compression stockings 20-30 mm gradient #2 elevate legs as much as possible #3  ibuprofen daily on a regular basis for pain #4 return in 3 months-if no significant improvement then  Patient will need #1 lase laser ablation left great saphenous veinr ablation right great saphenous vein at 1027 phlebectomy of painful varicosities followed by #2 laser ablation left great saphenous vein Patient to return in 3 months

## 2013-11-17 ENCOUNTER — Ambulatory Visit (HOSPITAL_COMMUNITY)
Admission: RE | Admit: 2013-11-17 | Discharge: 2013-11-17 | Disposition: A | Discharge status: Home / Self Care | Payer: 59 | Source: Ambulatory Visit | Attending: Orthopedic Surgery | Admitting: Orthopedic Surgery

## 2013-11-17 DIAGNOSIS — M25561 Pain in right knee: Secondary | ICD-10-CM

## 2013-11-17 DIAGNOSIS — X58XXXA Exposure to other specified factors, initial encounter: Secondary | ICD-10-CM | POA: Insufficient documentation

## 2013-11-17 DIAGNOSIS — IMO0002 Reserved for concepts with insufficient information to code with codable children: Secondary | ICD-10-CM | POA: Diagnosis present

## 2013-11-17 DIAGNOSIS — S83206A Unspecified tear of unspecified meniscus, current injury, right knee, initial encounter: Secondary | ICD-10-CM

## 2013-12-14 ENCOUNTER — Encounter: Payer: 59 | Admitting: Vascular Surgery

## 2013-12-14 ENCOUNTER — Encounter (HOSPITAL_COMMUNITY): Payer: 59

## 2014-02-14 ENCOUNTER — Encounter: Payer: Self-pay | Admitting: Vascular Surgery

## 2014-02-15 ENCOUNTER — Encounter: Payer: Self-pay | Admitting: Vascular Surgery

## 2014-02-15 ENCOUNTER — Ambulatory Visit (INDEPENDENT_AMBULATORY_CARE_PROVIDER_SITE_OTHER): Payer: 59 | Admitting: Vascular Surgery

## 2014-02-15 VITALS — BP 148/72 | HR 59 | Ht 66.0 in | Wt 245.0 lb

## 2014-02-15 DIAGNOSIS — I83899 Varicose veins of unspecified lower extremities with other complications: Secondary | ICD-10-CM | POA: Insufficient documentation

## 2014-02-15 DIAGNOSIS — I83893 Varicose veins of bilateral lower extremities with other complications: Secondary | ICD-10-CM

## 2014-02-15 NOTE — Progress Notes (Signed)
Subjective:     Patient ID: Jessica Bowers, female   DOB: [birth year], 63 y.o.   MRN: [id number]  HPIThis 63 year old female returns for continued follow-up regarding her bilateral painful varicosities due to gross reflux in bilateral great saphenous veins. She has tried long-leg elastic compression stockings 20-30 millimeters gradient as well as elevation ibuprofen. She's had no success. She continues to have aching throbbing and burning discomfort throughout the day while at work. It is affecting her daily living. She has no history of DVT.  Past Medical History  Diagnosis Date  . Obesity   . Hyperlipidemia   . Diabetes   . HTN (hypertension)   . GERD (gastroesophageal reflux disease)   . CKD (chronic kidney disease)   . Patellar tendinitis   . LEG PAIN, LEFT   . Arthritis   . Varicose veins     History  Substance Use Topics  . Smoking status: Never Smoker   . Smokeless tobacco: Never Used  . Alcohol Use: Yes     Comment: occ    Family History  Problem Relation Age of Onset  . Hypertension Father   . Diabetes Father   . Hyperlipidemia Mother   . CAD Mother     Allergies  Allergen Reactions  . Ace Inhibitors Cough  . Crestor [Rosuvastatin] Other (See Comments)    Leg cramps  . Glucophage [Metformin] Other (See Comments)    headaches  . Nsaids Nausea And Vomiting    Current outpatient prescriptions: atenolol (TENORMIN) 50 MG tablet, Take 50 mg by mouth daily., Disp: , Rfl: ;  atorvastatin (LIPITOR) 10 MG tablet, Take 10 mg by mouth daily. , Disp: , Rfl: ;  cholecalciferol (VITAMIN D) 1000 UNITS tablet, Take 1,000 Units by mouth daily., Disp: , Rfl: ;  GNP CINNAMON PO, Take by mouth., Disp: , Rfl: ;  linagliptin (TRADJENTA) 5 MG TABS tablet, Take 5 mg by mouth daily., Disp: , Rfl:  losartan (COZAAR) 100 MG tablet, Take 100 mg by mouth daily., Disp: , Rfl: ;  Multiple Vitamin (MULTIVITAMIN) capsule, Take 1 capsule by mouth daily., Disp: , Rfl: ;  Omega 3 1200 MG CAPS, Take  by mouth., Disp: , Rfl: ;  oxybutynin (DITROPAN-XL) 10 MG 24 hr tablet, Take 10 mg by mouth at bedtime., Disp: , Rfl: ;  vitamin B-12 (CYANOCOBALAMIN) 500 MCG tablet, Take 500 mcg by mouth daily., Disp: , Rfl:   BP 148/72 mmHg  Pulse 59  Ht 5\' 6"  (1.676 m)  Wt 245 lb (111.131 kg)  BMI 39.56 kg/m2  SpO2 98%  Body mass index is 39.56 kg/(m^2).           Review of SystemsDenies chest pain, dyspnea on exertion, PND, orthopnea. Does have some weakness in the legs and numbness in the legs. Other segment complete review of systems     Objective:   Physical Exam BP 148/72 mmHg  Pulse 59  Ht 5\' 6"  (1.676 m)  Wt 245 lb (111.131 kg)  BMI 39.56 kg/m2  SpO2 98%  Gen. Well-developed well-nourished female no apparent stress alert and oriented 3 Right leg with bulging varicosities in the mid to anterior thigh and into below-knee medial calf area with 1+ edema. Left leg with 1+ edema no active ulceration or hyperpigmentation noted.  Patient has documented gross reflux bilateral great saphenous veins from mid calf to saphenofemoral junction     Assessment:     Bilateral painful varicosities due to gross reflux bilateral great saphenous veins from mid calf  to saphenofemoral junction. Patient's symptoms are resistant to conservative measures and affecting her daily living. She has tried long-leg elastic compression stockings elevation ibuprofen with no success.     Plan:     Patient needs #1 laser ablation right great saphenous vein plus greater than 20 stab phlebectomy followed by #2 laser ablation left great saphenous vein Will proceed with recertification to perform this in the near future

## 2014-02-18 ENCOUNTER — Other Ambulatory Visit: Payer: Self-pay | Admitting: *Deleted

## 2014-02-18 DIAGNOSIS — I83891 Varicose veins of right lower extremities with other complications: Secondary | ICD-10-CM

## 2014-03-02 ENCOUNTER — Encounter: Payer: Self-pay | Admitting: Surgery

## 2014-03-07 ENCOUNTER — Encounter: Payer: Self-pay | Admitting: Vascular Surgery

## 2014-03-07 ENCOUNTER — Ambulatory Visit (INDEPENDENT_AMBULATORY_CARE_PROVIDER_SITE_OTHER): Payer: 59 | Admitting: Vascular Surgery

## 2014-03-07 VITALS — BP 168/86 | HR 79 | Resp 16 | Ht 66.0 in | Wt 230.0 lb

## 2014-03-07 DIAGNOSIS — I83891 Varicose veins of right lower extremities with other complications: Secondary | ICD-10-CM

## 2014-03-07 NOTE — Progress Notes (Signed)
   Laser Ablation Procedure      Date: 03/07/2014    Leonora Gores DOB:Jul 03, 1950  Consent signed: Yes  Surgeon:J.D. Kellie Simmering  Procedure: Laser Ablation: right Greater Saphenous Vein  BP 168/86 mmHg  Pulse 79  Resp 16  Ht 5\' 6"  (1.676 m)  Wt 230 lb (104.327 kg)  BMI 37.14 kg/m2  Start time: 9am   End time: 10:30  Tumescent Anesthesia: 450 cc 0.9% NaCl with 50 cc Lidocaine HCL with 1% Epi and 15 cc 8.4% NaHCO3  Local Anesthesia: 9 cc Lidocaine HCL and NaHCO3 (ratio 2:1)  Pulsed mode: 15 watts, 533ms delay, 1.0 duration Total energy: 763.42, total pulses: 51, total time: :57     Stab Phlebectomy: >20 Sites: Thigh and Calf  Patient tolerated procedure well: Yes  Notes:   Description of Procedure:  After marking the course of the secondary varicosities, the patient was placed on the operating table in the supine position, and the right leg was prepped and draped in sterile fashion.   Local anesthetic was administered and under ultrasound guidance the saphenous vein was accessed with a micro needle and guide wire; then the mirco puncture sheath was place.  A guide wire was inserted saphenofemoral junction , followed by a 5 french sheath.  The position of the sheath and then the laser fiber below the junction was confirmed using the ultrasound.  Tumescent anesthesia was administered along the course of the saphenous vein using ultrasound guidance. The patient was placed in Trendelenburg position and protective laser glasses were placed on patient and staff, and the laser was fired at 15 watt pulsed mode advancing 1-2 mm per sec for a total of 763.42 joules.   For stab phlebectomies, local anesthetic was administered at the previously marked varicosities, and tumescent anesthesia was administered around the vessels.  Greater than 20 stab wounds were made using the tip of an 11 blade. And using the vein hook, the phlebectomies were performed using a hemostat to avulse the varicosities.   Adequate hemostasis was achieved.     Steri strips were applied to the stab wounds and ABD pads and thigh high compression stockings were applied.  Ace wrap bandages were applied over the phlebectomy sites and at the top of the saphenofemoral junction. Blood loss was less than 15 cc.  The patient ambulated out of the operating room having tolerated the procedure well.

## 2014-03-07 NOTE — Progress Notes (Signed)
Subjective:     Patient ID: Jessica Bowers, female   DOB: 28-May-1950, 63 y.o.   MRN: [id number]  HPI this 63 year old female had laser ablation of the right great saphenous vein from the mid thigh to the saphenofemoral junction performed under local tumescent anesthesia plus greater than 20 stab phlebectomy of painful varicosities. A total of 764 J of energy was utilized. She tolerated the procedure well.   Review of Systems     Objective:   Physical Exam BP 168/86 mmHg  Pulse 79  Resp 16  Ht 5\' 6"  (1.676 m)  Wt 230 lb (104.327 kg)  BMI 37.14 kg/m2       Assessment:     Well-tolerated laser ablation right great saphenous vein plus greater than 20 stab phlebectomy of painful varicosities performed under local tumescent anesthesia    Plan:     Return in one week for venous duplex exam to confirm closure right great saphenous vein Will then proceed end near future for laser ablation left great saphenous vein

## 2014-03-08 ENCOUNTER — Telehealth: Payer: Self-pay | Admitting: *Deleted

## 2014-03-08 NOTE — Telephone Encounter (Signed)
Pt doing well. No bleeding Some swelling of her foot this am so I told her it would be fine for her to loosen the ace wraps and reapply them. She is following all instructions we gave her.

## 2014-03-10 ENCOUNTER — Other Ambulatory Visit: Payer: Self-pay | Admitting: Orthopedic Surgery

## 2014-03-14 ENCOUNTER — Ambulatory Visit (HOSPITAL_COMMUNITY)
Admission: RE | Admit: 2014-03-14 | Discharge: 2014-03-14 | Disposition: A | Discharge status: Home / Self Care | Payer: 59 | Source: Ambulatory Visit | Attending: Vascular Surgery | Admitting: Vascular Surgery

## 2014-03-14 ENCOUNTER — Encounter: Payer: Self-pay | Admitting: Vascular Surgery

## 2014-03-14 ENCOUNTER — Ambulatory Visit (INDEPENDENT_AMBULATORY_CARE_PROVIDER_SITE_OTHER): Payer: Self-pay | Admitting: Vascular Surgery

## 2014-03-14 VITALS — BP 170/76 | HR 65 | Resp 14

## 2014-03-14 DIAGNOSIS — I83893 Varicose veins of bilateral lower extremities with other complications: Secondary | ICD-10-CM

## 2014-03-14 DIAGNOSIS — I83891 Varicose veins of right lower extremities with other complications: Secondary | ICD-10-CM | POA: Diagnosis present

## 2014-03-14 NOTE — Progress Notes (Signed)
Subjective:     Patient ID: Jessica Bowers, female   DOB: Nov 16, 1950, 63 y.o.   MRN: [id number]  HPI this 63 year old female returns 1 week post laser ablation right great saphenous vein with greater than 20 stab phlebectomy of painful varicosities. She has had mild to moderate discomfort she has had a lot of ecchymosis but she does bruise easily she states. She's had no distal edema. She has been able to apply her long-leg stockings without difficulty. She denies any chest pain, dyspnea on exertion, PND, orthopnea, or hemoptysis. Review of Systems     Objective:   Physical Exam BP 170/76 mmHg  Pulse 65  Resp 14  Gen. well-developed well-nourished female no apparent stress alert and oriented 3 Lungs no rhonchi or wheezing Right leg with moderate ecchymosis surrounding stab phlebectomy sites and entrance of laser ablation on medial distal thigh and medial proximal calf. No distal edema noted. 3+ dorsalis pedis pulse palpable.  The word a venous duplex exam of the right leg which I reviewed and interpreted. There is no DVT. Saphenous vein is closed from the proximal to mid thigh up to near the saphenofemoral junction     Assessment:     Successful laser ablation right great saphenous vein with multiple stab phlebectomy of painful varicosities    Plan:     Return next week for laser ablation left great saphenous vein

## 2014-03-15 ENCOUNTER — Other Ambulatory Visit (HOSPITAL_COMMUNITY): Payer: Self-pay | Admitting: Obstetrics and Gynecology

## 2014-03-15 DIAGNOSIS — Z1231 Encounter for screening mammogram for malignant neoplasm of breast: Secondary | ICD-10-CM

## 2014-03-18 ENCOUNTER — Encounter: Payer: Self-pay | Admitting: Vascular Surgery

## 2014-03-18 ENCOUNTER — Ambulatory Visit (HOSPITAL_COMMUNITY)
Admission: RE | Admit: 2014-03-18 | Discharge: 2014-03-18 | Disposition: A | Discharge status: Home / Self Care | Payer: 59 | Source: Ambulatory Visit | Attending: Obstetrics and Gynecology | Admitting: Obstetrics and Gynecology

## 2014-03-18 DIAGNOSIS — Z1231 Encounter for screening mammogram for malignant neoplasm of breast: Secondary | ICD-10-CM | POA: Diagnosis not present

## 2014-03-21 ENCOUNTER — Encounter: Payer: Self-pay | Admitting: Vascular Surgery

## 2014-03-21 ENCOUNTER — Ambulatory Visit (INDEPENDENT_AMBULATORY_CARE_PROVIDER_SITE_OTHER): Payer: 59 | Admitting: Vascular Surgery

## 2014-03-21 VITALS — BP 137/84 | HR 79 | Resp 16 | Ht 66.0 in | Wt 230.0 lb

## 2014-03-21 DIAGNOSIS — I83893 Varicose veins of bilateral lower extremities with other complications: Secondary | ICD-10-CM

## 2014-03-21 NOTE — Progress Notes (Signed)
   Laser Ablation Procedure      Date: 03/21/2014    Jessica Bowers DOB:[birth year]  Consent signed: Yes  Surgeon:J.D. Kellie Simmering  Procedure: Laser Ablation: left Greater Saphenous Vein  BP 137/84 mmHg  Pulse 79  Resp 16  Ht 5\' 6"  (1.676 m)  Wt 230 lb (104.327 kg)  BMI 37.14 kg/m2  Start time: 11   End time: 11:50  Tumescent Anesthesia: 425 cc 0.9% NaCl with 50 cc Lidocaine HCL with 1% Epi and 15 cc 8.4% NaHCO3  Local Anesthesia: 5 cc Lidocaine HCL and NaHCO3 (ratio 2:1)  Pulsed mode: 15 watts, 513ms delay, 1.0 duration Total energy: 2080 total pulses: 139 total time: 2:19     Patient tolerated procedure well: Yes  Notes: Experienced pain as tumescent was being infused.  Description of Procedure:  After marking the course of the secondary varicosities, the patient was placed on the operating table in the supine position, and the left leg was prepped and draped in sterile fashion.   Local anesthetic was administered and under ultrasound guidance the saphenous vein was accessed with a micro needle and guide wire; then the mirco puncture sheath was place.  A guide wire was inserted saphenofemoral junction , followed by a 5 french sheath.  The position of the sheath and then the laser fiber below the junction was confirmed using the ultrasound.  Tumescent anesthesia was administered along the course of the saphenous vein using ultrasound guidance. The patient was placed in Trendelenburg position and protective laser glasses were placed on patient and staff, and the laser was fired at 15 watt pulsed mode advancing 1-2 mm per sec for a total of 2080 joules.     Steri strips were applied to the stab wounds and ABD pads and thigh high compression stockings were applied.  Ace wrap bandages were applied over the phlebectomy sites and at the top of the saphenofemoral junction. Blood loss was less than 15 cc.  The patient ambulated out of the operating room having tolerated the procedure  well.

## 2014-03-21 NOTE — Progress Notes (Signed)
Subjective:     Patient ID: Jessica Bowers, female   DOB: 1950/09/08, 63 y.o.   MRN: [id number]  HPI this 63 year old female had laser ablation of the left great saphenous vein from the proximal calf to near the saphenofemoral junction performed under local tumescent anesthesia. She tolerated the procedure well. A total of 2080 J of energy was utilized. Review of Systems     Objective:   Physical Exam BP 137/84 mmHg  Pulse 79  Resp 16  Ht 5\' 6"  (1.676 m)  Wt 230 lb (104.327 kg)  BMI 37.14 kg/m2       Assessment:     Well-tolerated laser ablation left great saphenous vein performed under local tumescent anesthesia for gross reflux    Plan:     Return in one week for venous duplex exam to confirm closure left great saphenous vein This will complete patient's treatment regimen

## 2014-03-22 ENCOUNTER — Telehealth: Payer: Self-pay | Admitting: *Deleted

## 2014-03-22 NOTE — Telephone Encounter (Signed)
Asked the pat to call me if she is having any adverse issues.

## 2014-03-25 ENCOUNTER — Encounter: Payer: Self-pay | Admitting: Vascular Surgery

## 2014-03-28 ENCOUNTER — Ambulatory Visit (HOSPITAL_COMMUNITY)
Admission: RE | Admit: 2014-03-28 | Discharge: 2014-03-28 | Disposition: A | Discharge status: Home / Self Care | Payer: 59 | Source: Ambulatory Visit | Attending: Vascular Surgery | Admitting: Vascular Surgery

## 2014-03-28 ENCOUNTER — Ambulatory Visit (INDEPENDENT_AMBULATORY_CARE_PROVIDER_SITE_OTHER): Payer: Self-pay | Admitting: Vascular Surgery

## 2014-03-28 ENCOUNTER — Encounter: Payer: Self-pay | Admitting: Vascular Surgery

## 2014-03-28 VITALS — BP 128/70 | HR 60 | Resp 16 | Ht 66.0 in | Wt 230.0 lb

## 2014-03-28 DIAGNOSIS — I83893 Varicose veins of bilateral lower extremities with other complications: Secondary | ICD-10-CM

## 2014-03-28 DIAGNOSIS — I83891 Varicose veins of right lower extremities with other complications: Secondary | ICD-10-CM | POA: Diagnosis present

## 2014-03-28 NOTE — Progress Notes (Signed)
Subjective:     Patient ID: Arra Connaughton, female   DOB: 11/27/1950, 63 y.o.   MRN: [id number]  HPI this 63 year old female returns 1 week post laser ablation left great saphenous vein. She has worn her elastic compression stockings and taken ibuprofen as instructed. She has had some mild to moderate discomfort which is resolving   Review of Systems     Objective:   Physical Exam BP 128/70 mmHg  Pulse 60  Resp 16  Ht 5\' 6"  (1.676 m)  Wt 230 lb (104.327 kg)  BMI 37.14 kg/m2  Gen. well-developed well-nourished female no apparent stress alert and oriented 3 Left leg with moderate ecchymosis in mid thigh and distal thigh medially. 1+ distal edema. 3+ dorsalis pedis pulse palpable.  Today I ordered a venous duplex exam the left leg which I reviewed and interpreted. There is no DVT. The left great saphenous vein is closed from the distal thigh to near the saphenofemoral junction.     Assessment:     Successful bilateral laser ablation great saphenous veins with stab phlebectomy on the right side    Plan:     Return to see [place] on when necessary basis

## 2014-03-29 ENCOUNTER — Other Ambulatory Visit: Payer: Self-pay | Admitting: Cardiology

## 2014-03-29 ENCOUNTER — Ambulatory Visit (HOSPITAL_COMMUNITY)
Admission: RE | Admit: 2014-03-29 | Discharge: 2014-03-29 | Disposition: A | Discharge status: Home / Self Care | Payer: 59 | Source: Ambulatory Visit | Attending: Internal Medicine | Admitting: Internal Medicine

## 2014-03-29 ENCOUNTER — Other Ambulatory Visit (HOSPITAL_COMMUNITY): Payer: Self-pay | Admitting: Internal Medicine

## 2014-03-29 DIAGNOSIS — M19079 Primary osteoarthritis, unspecified ankle and foot: Secondary | ICD-10-CM | POA: Insufficient documentation

## 2014-03-29 DIAGNOSIS — Q742 Other congenital malformations of lower limb(s), including pelvic girdle: Secondary | ICD-10-CM

## 2014-04-05 ENCOUNTER — Encounter (HOSPITAL_BASED_OUTPATIENT_CLINIC_OR_DEPARTMENT_OTHER): Payer: Self-pay | Admitting: *Deleted

## 2014-04-05 NOTE — Progress Notes (Signed)
To come in for bmet-ekg 1/15 when here for ft surgery

## 2014-04-05 NOTE — Progress Notes (Signed)
   04/05/14 1116  OBSTRUCTIVE SLEEP APNEA  Have you ever been diagnosed with sleep apnea through a sleep study? No  Do you snore loudly (loud enough to be heard through closed doors)?  0  Do you often feel tired, fatigued, or sleepy during the daytime? 0  Has anyone observed you stop breathing during your sleep? 0  Do you have, or are you being treated for high blood pressure? 1  BMI more than 35 kg/m2? 1  Age over 61 years old? 1  Neck circumference greater than 40 cm/16 inches? 1  Gender: 0  Obstructive Sleep Apnea Score 4  Score 4 or greater  Results sent to PCP

## 2014-04-06 ENCOUNTER — Encounter (HOSPITAL_BASED_OUTPATIENT_CLINIC_OR_DEPARTMENT_OTHER)
Admission: RE | Admit: 2014-04-06 | Discharge: 2014-04-06 | Disposition: A | Discharge status: Home / Self Care | Payer: 59 | Source: Ambulatory Visit | Attending: Orthopedic Surgery | Admitting: Orthopedic Surgery

## 2014-04-06 DIAGNOSIS — M79605 Pain in left leg: Secondary | ICD-10-CM | POA: Diagnosis not present

## 2014-04-06 DIAGNOSIS — Z8249 Family history of ischemic heart disease and other diseases of the circulatory system: Secondary | ICD-10-CM | POA: Diagnosis not present

## 2014-04-06 DIAGNOSIS — G5602 Carpal tunnel syndrome, left upper limb: Secondary | ICD-10-CM | POA: Diagnosis not present

## 2014-04-06 DIAGNOSIS — Z888 Allergy status to other drugs, medicaments and biological substances status: Secondary | ICD-10-CM | POA: Diagnosis not present

## 2014-04-06 DIAGNOSIS — N189 Chronic kidney disease, unspecified: Secondary | ICD-10-CM | POA: Diagnosis not present

## 2014-04-06 DIAGNOSIS — I129 Hypertensive chronic kidney disease with stage 1 through stage 4 chronic kidney disease, or unspecified chronic kidney disease: Secondary | ICD-10-CM | POA: Diagnosis not present

## 2014-04-06 DIAGNOSIS — Z823 Family history of stroke: Secondary | ICD-10-CM | POA: Diagnosis not present

## 2014-04-06 DIAGNOSIS — K219 Gastro-esophageal reflux disease without esophagitis: Secondary | ICD-10-CM | POA: Diagnosis not present

## 2014-04-06 DIAGNOSIS — M1990 Unspecified osteoarthritis, unspecified site: Secondary | ICD-10-CM | POA: Diagnosis not present

## 2014-04-06 DIAGNOSIS — Z833 Family history of diabetes mellitus: Secondary | ICD-10-CM | POA: Diagnosis not present

## 2014-04-06 DIAGNOSIS — M7650 Patellar tendinitis, unspecified knee: Secondary | ICD-10-CM | POA: Diagnosis not present

## 2014-04-06 DIAGNOSIS — I8390 Asymptomatic varicose veins of unspecified lower extremity: Secondary | ICD-10-CM | POA: Diagnosis not present

## 2014-04-06 DIAGNOSIS — E119 Type 2 diabetes mellitus without complications: Secondary | ICD-10-CM | POA: Diagnosis not present

## 2014-04-06 DIAGNOSIS — E785 Hyperlipidemia, unspecified: Secondary | ICD-10-CM | POA: Diagnosis not present

## 2014-04-06 DIAGNOSIS — Z6839 Body mass index (BMI) 39.0-39.9, adult: Secondary | ICD-10-CM | POA: Diagnosis not present

## 2014-04-06 LAB — BASIC METABOLIC PANEL
ANION GAP: 7 (ref 5–15)
BUN: 12 mg/dL (ref 6–23)
CHLORIDE: 106 meq/L (ref 96–112)
CO2: 26 mmol/L (ref 19–32)
Calcium: 9.3 mg/dL (ref 8.4–10.5)
Creatinine, Ser: 0.82 mg/dL (ref 0.50–1.10)
GFR calc Af Amer: 88 mL/min — ABNORMAL LOW (ref >90)
GFR calc non Af Amer: 76 mL/min — ABNORMAL LOW (ref >90)
GLUCOSE: 138 mg/dL — AB (ref 70–99)
Potassium: 4.3 mmol/L (ref 3.5–5.1)
SODIUM: 139 mmol/L (ref 135–145)

## 2014-04-07 ENCOUNTER — Ambulatory Visit (HOSPITAL_BASED_OUTPATIENT_CLINIC_OR_DEPARTMENT_OTHER): Payer: 59 | Admitting: Anesthesiology

## 2014-04-07 ENCOUNTER — Ambulatory Visit (HOSPITAL_BASED_OUTPATIENT_CLINIC_OR_DEPARTMENT_OTHER)
Admission: RE | Admit: 2014-04-07 | Discharge: 2014-04-07 | Disposition: A | Discharge status: Home / Self Care | Payer: 59 | Source: Ambulatory Visit | Attending: Orthopedic Surgery | Admitting: Orthopedic Surgery

## 2014-04-07 ENCOUNTER — Encounter (HOSPITAL_BASED_OUTPATIENT_CLINIC_OR_DEPARTMENT_OTHER): Payer: Self-pay | Admitting: Orthopedic Surgery

## 2014-04-07 ENCOUNTER — Encounter (HOSPITAL_BASED_OUTPATIENT_CLINIC_OR_DEPARTMENT_OTHER)
Admission: RE | Disposition: A | Discharge status: Home / Self Care | Payer: Self-pay | Source: Ambulatory Visit | Attending: Orthopedic Surgery

## 2014-04-07 DIAGNOSIS — E785 Hyperlipidemia, unspecified: Secondary | ICD-10-CM | POA: Insufficient documentation

## 2014-04-07 DIAGNOSIS — Z6839 Body mass index (BMI) 39.0-39.9, adult: Secondary | ICD-10-CM | POA: Insufficient documentation

## 2014-04-07 DIAGNOSIS — M79605 Pain in left leg: Secondary | ICD-10-CM | POA: Insufficient documentation

## 2014-04-07 DIAGNOSIS — Z833 Family history of diabetes mellitus: Secondary | ICD-10-CM | POA: Insufficient documentation

## 2014-04-07 DIAGNOSIS — M1990 Unspecified osteoarthritis, unspecified site: Secondary | ICD-10-CM | POA: Insufficient documentation

## 2014-04-07 DIAGNOSIS — Z823 Family history of stroke: Secondary | ICD-10-CM | POA: Insufficient documentation

## 2014-04-07 DIAGNOSIS — I8390 Asymptomatic varicose veins of unspecified lower extremity: Secondary | ICD-10-CM | POA: Insufficient documentation

## 2014-04-07 DIAGNOSIS — E119 Type 2 diabetes mellitus without complications: Secondary | ICD-10-CM | POA: Insufficient documentation

## 2014-04-07 DIAGNOSIS — G5602 Carpal tunnel syndrome, left upper limb: Secondary | ICD-10-CM | POA: Diagnosis not present

## 2014-04-07 DIAGNOSIS — Z8249 Family history of ischemic heart disease and other diseases of the circulatory system: Secondary | ICD-10-CM | POA: Insufficient documentation

## 2014-04-07 DIAGNOSIS — Z888 Allergy status to other drugs, medicaments and biological substances status: Secondary | ICD-10-CM | POA: Insufficient documentation

## 2014-04-07 DIAGNOSIS — K219 Gastro-esophageal reflux disease without esophagitis: Secondary | ICD-10-CM | POA: Insufficient documentation

## 2014-04-07 DIAGNOSIS — N189 Chronic kidney disease, unspecified: Secondary | ICD-10-CM | POA: Insufficient documentation

## 2014-04-07 DIAGNOSIS — I129 Hypertensive chronic kidney disease with stage 1 through stage 4 chronic kidney disease, or unspecified chronic kidney disease: Secondary | ICD-10-CM | POA: Insufficient documentation

## 2014-04-07 DIAGNOSIS — M7650 Patellar tendinitis, unspecified knee: Secondary | ICD-10-CM | POA: Insufficient documentation

## 2014-04-07 HISTORY — PX: CARPAL TUNNEL RELEASE: SHX101

## 2014-04-07 LAB — POCT HEMOGLOBIN-HEMACUE: Hemoglobin: 13.5 g/dL (ref 12.0–15.0)

## 2014-04-07 LAB — GLUCOSE, CAPILLARY
Glucose-Capillary: 129 mg/dL — ABNORMAL HIGH (ref 70–99)
Glucose-Capillary: 125 mg/dL — ABNORMAL HIGH (ref 70–99)

## 2014-04-07 SURGERY — CARPAL TUNNEL RELEASE
Anesthesia: Monitor Anesthesia Care | Site: Wrist | Laterality: Left

## 2014-04-07 MED ORDER — CHLORHEXIDINE GLUCONATE 4 % EX LIQD: 60.0000 mL | Freq: Once | CUTANEOUS | Status: DC

## 2014-04-07 MED ORDER — ONDANSETRON HCL 4 MG/2ML IJ SOLN: 4.0000 mg | Freq: Once | INTRAMUSCULAR | Status: DC | PRN

## 2014-04-07 MED ORDER — MIDAZOLAM HCL 2 MG/2ML IJ SOLN
INTRAMUSCULAR | Status: AC
  Filled 2014-04-07: qty 2

## 2014-04-07 MED ORDER — FENTANYL CITRATE 0.05 MG/ML IJ SOLN: 25.0000 ug | INTRAMUSCULAR | Status: DC | PRN

## 2014-04-07 MED ORDER — PROPOFOL 10 MG/ML IV BOLUS
INTRAVENOUS | Status: DC | PRN
  Administered 2014-04-07: 200 mg via INTRAVENOUS

## 2014-04-07 MED ORDER — CEFAZOLIN SODIUM-DEXTROSE 2-3 GM-% IV SOLR: 2.0000 g | INTRAVENOUS | Status: DC

## 2014-04-07 MED ORDER — CEFAZOLIN SODIUM-DEXTROSE 2-3 GM-% IV SOLR
2.0000 g | INTRAVENOUS | Status: AC
  Administered 2014-04-07: 2 g via INTRAVENOUS

## 2014-04-07 MED ORDER — BUPIVACAINE HCL (PF) 0.25 % IJ SOLN
INTRAMUSCULAR | Status: DC | PRN
  Administered 2014-04-07: 4 mL

## 2014-04-07 MED ORDER — CEFAZOLIN SODIUM-DEXTROSE 2-3 GM-% IV SOLR
INTRAVENOUS | Status: AC
  Filled 2014-04-07: qty 50

## 2014-04-07 MED ORDER — FENTANYL CITRATE 0.05 MG/ML IJ SOLN: 50.0000 ug | INTRAMUSCULAR | Status: DC | PRN

## 2014-04-07 MED ORDER — BUPIVACAINE HCL (PF) 0.25 % IJ SOLN
INTRAMUSCULAR | Status: AC
  Filled 2014-04-07: qty 30

## 2014-04-07 MED ORDER — LIDOCAINE HCL (CARDIAC) 20 MG/ML IV SOLN
INTRAVENOUS | Status: DC | PRN
  Administered 2014-04-07: 100 mg via INTRAVENOUS

## 2014-04-07 MED ORDER — MIDAZOLAM HCL 5 MG/5ML IJ SOLN
INTRAMUSCULAR | Status: DC | PRN
  Administered 2014-04-07: 2 mg via INTRAVENOUS

## 2014-04-07 MED ORDER — FENTANYL CITRATE 0.05 MG/ML IJ SOLN
INTRAMUSCULAR | Status: DC | PRN
  Administered 2014-04-07: 100 ug via INTRAVENOUS

## 2014-04-07 MED ORDER — FENTANYL CITRATE 0.05 MG/ML IJ SOLN
INTRAMUSCULAR | Status: AC
  Filled 2014-04-07: qty 4

## 2014-04-07 MED ORDER — MIDAZOLAM HCL 2 MG/2ML IJ SOLN: 1.0000 mg | INTRAMUSCULAR | Status: DC | PRN

## 2014-04-07 MED ORDER — DEXAMETHASONE SODIUM PHOSPHATE 10 MG/ML IJ SOLN
INTRAMUSCULAR | Status: DC | PRN
  Administered 2014-04-07: 5 mg via INTRAVENOUS

## 2014-04-07 MED ORDER — HYDROCODONE-ACETAMINOPHEN 5-325 MG PO TABS: 1.0000 | ORAL_TABLET | Freq: Four times a day (QID) | ORAL | Status: DC | PRN

## 2014-04-07 MED ORDER — LACTATED RINGERS IV SOLN
INTRAVENOUS | Status: DC
  Administered 2014-04-07: 07:00:00 via INTRAVENOUS

## 2014-04-07 SURGICAL SUPPLY — 41 items
APL SKNCLS STERI-STRIP NONHPOA (GAUZE/BANDAGES/DRESSINGS) ×1
BENZOIN TINCTURE PRP APPL 2/3 (GAUZE/BANDAGES/DRESSINGS) ×2 IMPLANT
BLADE SURG 15 STRL LF DISP TIS (BLADE) ×1 IMPLANT
BLADE SURG 15 STRL SS (BLADE) ×2
BNDG CMPR 9X4 STRL LF SNTH (GAUZE/BANDAGES/DRESSINGS) ×1
BNDG COHESIVE 3X5 TAN STRL LF (GAUZE/BANDAGES/DRESSINGS) ×2 IMPLANT
BNDG ESMARK 4X9 LF (GAUZE/BANDAGES/DRESSINGS) ×1 IMPLANT
BNDG GAUZE ELAST 4 BULKY (GAUZE/BANDAGES/DRESSINGS) ×2 IMPLANT
CHLORAPREP W/TINT 26ML (MISCELLANEOUS) ×2 IMPLANT
CORDS BIPOLAR (ELECTRODE) ×2 IMPLANT
COVER BACK TABLE 60X90IN (DRAPES) ×2 IMPLANT
COVER MAYO STAND STRL (DRAPES) ×2 IMPLANT
CUFF TOURNIQUET SINGLE 18IN (TOURNIQUET CUFF) ×1 IMPLANT
CUFF TOURNIQUET SINGLE 24IN (TOURNIQUET CUFF) ×2 IMPLANT
DRAPE EXTREMITY T 121X128X90 (DRAPE) ×2 IMPLANT
DRAPE SURG 17X23 STRL (DRAPES) ×2 IMPLANT
DRSG PAD ABDOMINAL 8X10 ST (GAUZE/BANDAGES/DRESSINGS) ×2 IMPLANT
GAUZE SPONGE 4X4 12PLY STRL (GAUZE/BANDAGES/DRESSINGS) ×2 IMPLANT
GAUZE XEROFORM 1X8 LF (GAUZE/BANDAGES/DRESSINGS) ×2 IMPLANT
GLOVE BIOGEL PI IND STRL 7.0 (GLOVE) IMPLANT
GLOVE BIOGEL PI IND STRL 8.5 (GLOVE) ×1 IMPLANT
GLOVE BIOGEL PI INDICATOR 7.0 (GLOVE) ×1
GLOVE BIOGEL PI INDICATOR 8.5 (GLOVE) ×1
GLOVE ECLIPSE 6.5 STRL STRAW (GLOVE) ×1 IMPLANT
GLOVE EXAM NITRILE LRG STRL (GLOVE) ×1 IMPLANT
GLOVE SURG ORTHO 8.0 STRL STRW (GLOVE) ×2 IMPLANT
GOWN STRL REUS W/ TWL LRG LVL3 (GOWN DISPOSABLE) ×1 IMPLANT
GOWN STRL REUS W/TWL LRG LVL3 (GOWN DISPOSABLE) ×2
GOWN STRL REUS W/TWL XL LVL3 (GOWN DISPOSABLE) ×2 IMPLANT
NDL PRECISIONGLIDE 27X1.5 (NEEDLE) IMPLANT
NEEDLE PRECISIONGLIDE 27X1.5 (NEEDLE) ×2 IMPLANT
NS IRRIG 1000ML POUR BTL (IV SOLUTION) ×2 IMPLANT
PACK BASIN DAY SURGERY FS (CUSTOM PROCEDURE TRAY) ×2 IMPLANT
STOCKINETTE 4X48 STRL (DRAPES) ×2 IMPLANT
STRIP CLOSURE SKIN 1/2X4 (GAUZE/BANDAGES/DRESSINGS) ×1 IMPLANT
SUT MNCRL AB 4-0 PS2 18 (SUTURE) ×1 IMPLANT
SUT VICRYL 4-0 PS2 18IN ABS (SUTURE) IMPLANT
SYR BULB 3OZ (MISCELLANEOUS) ×2 IMPLANT
SYR CONTROL 10ML LL (SYRINGE) ×1 IMPLANT
TOWEL OR 17X24 6PK STRL BLUE (TOWEL DISPOSABLE) ×2 IMPLANT
UNDERPAD 30X30 INCONTINENT (UNDERPADS AND DIAPERS) IMPLANT

## 2014-04-07 NOTE — Anesthesia Preprocedure Evaluation (Addendum)
Anesthesia Evaluation  Patient identified by MRN, date of birth, ID band Patient awake    Reviewed: Allergy & Precautions, H&P , NPO status , Patient's Chart, lab work & pertinent test results, reviewed documented beta blocker date and time   Airway Mallampati: I  TM Distance: >3 FB Neck ROM: Full    Dental  (+) Teeth Intact, Dental Advisory Given   Pulmonary  breath sounds clear to auscultation        Cardiovascular hypertension, Pt. on medications and Pt. on home beta blockers Rhythm:Regular Rate:Normal     Neuro/Psych    GI/Hepatic   Endo/Other  diabetes, Well Controlled, Type 2, Oral Hypoglycemic AgentsMorbid obesity  Renal/GU      Musculoskeletal   Abdominal   Peds  Hematology   Anesthesia Other Findings   Reproductive/Obstetrics                            Anesthesia Physical Anesthesia Plan  ASA: III  Anesthesia Plan: MAC and Bier Block   Post-op Pain Management:    Induction: Intravenous  Airway Management Planned: LMA  Additional Equipment:   Intra-op Plan:   Post-operative Plan: Extubation in OR  Informed Consent: I have reviewed the patients History and Physical, chart, labs and discussed the procedure including the risks, benefits and alternatives for the proposed anesthesia with the patient or authorized representative who has indicated his/her understanding and acceptance.   Dental advisory given  Plan Discussed with: CRNA, Anesthesiologist and Surgeon  Anesthesia Plan Comments:        Anesthesia Quick Evaluation

## 2014-04-07 NOTE — Op Note (Signed)
NAMEHANAA, Jessica Bowers              ACCOUNT NO.:  [id number]  MEDICAL RECORD NO.:  [id number]  LOCATION:                                 FACILITY:  PHYSICIAN:  Daryll Brod, M.D.       DATE OF BIRTH:  Mar 18, 1951  DATE OF PROCEDURE:  04/07/2014 DATE OF DISCHARGE:                              OPERATIVE REPORT   PREOPERATIVE DIAGNOSIS:  Carpal tunnel syndrome, left hand.  POSTOPERATIVE DIAGNOSIS:  Carpal tunnel syndrome, left hand.  OPERATION:  Decompression of left median nerve.  SURGEON:  Daryll Brod, M.D.  ANESTHESIA:  General with local infiltration.  ANESTHESIOLOGIST:  Lorrene Reid, M.D.  HISTORY:  The patient is a 63 year old female with a history of bilateral carpal tunnel syndrome.  She has elected to undergo release of her left side.  Nerve conductions are positive.  This has not responded to conservative treatment.  She is aware that there is no guarantee with the surgery, possibility of infection, recurrence of injury to arteries, nerves, tendons, incomplete relief of symptoms, dystrophy.  In preoperative area, the patient is seen, the extremity marked by both the patient and surgeon.  Antibiotic given.  PROCEDURE IN DETAIL:  The patient was brought to the operating room, where a general anesthetic was carried out without difficulty.  She was prepped using ChloraPrep, supine position, left arm free.  A 3-minute dry time was allowed.  Time-out taken, confirming the patient and procedure.  The limb was exsanguinated with an Esmarch bandage. Tourniquet placed high on the arm was inflated to 250 mmHg.  A longitudinal incision was made in the left palm, carried down through subcutaneous tissue.  Bleeders were electrocauterized.  Palmar fascia was split.  Superficial palmar arch identified.  The flexor tendon to the ring and little finger identified to the ulnar side of the median nerve.  The carpal retinaculum was incised with sharp dissection.  Right angle and Sewall  retractor were placed between skin and forearm fascia. The fascia was released for approximately a centimeter and a half to 2 cm proximal to the wrist crease under direct vision.  Canal was explored.  Air compression to the nerve was apparent.  Motor branch entered into muscle.  No further lesions were identified.  The wound was irrigated.  The skin was then closed with subcuticular 4-0 Monocryl suture.  A local infiltration with 0.25% bupivacaine without epinephrine was given, approximately 5 mL was used.  A sterile compressive dressing with the fingers free was applied.  On deflation of the tourniquet, all fingers immediately pinked.  She was taken to the recovery room for observation in satisfactory condition. She will be discharged home to return to the [facility] in 1 week on Norco.          ______________________________ Daryll Brod, M.D.     GK/MEDQ  D:  04/07/2014  T:  04/07/2014  Job:  [id number]

## 2014-04-07 NOTE — Anesthesia Postprocedure Evaluation (Signed)
  Anesthesia Post-op Note  Patient: Jessica Bowers  Procedure(s) Performed: Procedure(s): LEFT CARPAL TUNNEL RELEASE (Left)  Patient Location: PACU  Anesthesia Type: MAC, Bier Block   Level of Consciousness: awake, alert  and oriented  Airway and Oxygen Therapy: Patient Spontanous Breathing  Post-op Pain: none  Post-op Assessment: Post-op Vital signs reviewed  Post-op Vital Signs: Reviewed  Last Vitals:  Filed Vitals:   04/07/14 0937  BP:   Pulse: 59  Temp:   Resp: 15    Complications: No apparent anesthesia complications

## 2014-04-07 NOTE — Discharge Instructions (Addendum)

## 2014-04-07 NOTE — Anesthesia Procedure Notes (Signed)
Procedure Name: LMA Insertion Date/Time: 04/07/2014 8:32 AM Performed by: Lieutenant Diego Pre-anesthesia Checklist: Patient identified, Emergency Drugs available, Suction available and Patient being monitored Patient Re-evaluated:Patient Re-evaluated prior to inductionOxygen Delivery Method: Circle System Utilized Preoxygenation: Pre-oxygenation with 100% oxygen Intubation Type: IV induction Ventilation: Mask ventilation without difficulty LMA: LMA inserted LMA Size: 4.0 Number of attempts: 1 Airway Equipment and Method: bite block Placement Confirmation: positive ETCO2 and breath sounds checked- equal and bilateral Tube secured with: Tape Dental Injury: Teeth and Oropharynx as per pre-operative assessment

## 2014-04-07 NOTE — Transfer of Care (Signed)
Immediate Anesthesia Transfer of Care Note  Patient: Jessica Bowers  Procedure(s) Performed: Procedure(s): LEFT CARPAL TUNNEL RELEASE (Left)  Patient Location: PACU  Anesthesia Type:General  Level of Consciousness: awake and alert   Airway & Oxygen Therapy: Patient Spontanous Breathing and Patient connected to face mask oxygen  Post-op Assessment: Report given to PACU RN and Post -op Vital signs reviewed and stable  Post vital signs: Reviewed and stable  Complications: No apparent anesthesia complications

## 2014-04-07 NOTE — Brief Op Note (Signed)
04/07/2014  9:08 AM  PATIENT:  Orrin Brigham  63 y.o. female  PRE-OPERATIVE DIAGNOSIS:  LEFT CARPAL TUNNEL SYNDROME  POST-OPERATIVE DIAGNOSIS:  LEFT CARPAL TUNNEL SYNDROME  PROCEDURE:  Procedure(s): LEFT CARPAL TUNNEL RELEASE (Left)  SURGEON:  Surgeon(s) and Role:    * Daryll Brod, MD - Primary  PHYSICIAN ASSISTANT:   ASSISTANTS: none   ANESTHESIA:   local and general  EBL:  Total I/O In: 700 [I.V.:700] Out: -   BLOOD ADMINISTERED:none  DRAINS: none   LOCAL MEDICATIONS USED:  BUPIVICAINE   SPECIMEN:  No Specimen  DISPOSITION OF SPECIMEN:  N/A  COUNTS:  YES  TOURNIQUET:   Total Tourniquet Time Documented: Upper Arm (Left) - 16 minutes Total: Upper Arm (Left) - 16 minutes   DICTATION: .Other Dictation: Dictation Number [id number]  PLAN OF CARE: Discharge to home after PACU  PATIENT DISPOSITION:  PACU - hemodynamically stable.

## 2014-04-07 NOTE — Op Note (Signed)
Dictation Number [id number]

## 2014-04-07 NOTE — H&P (Signed)
Jessica Bowers is a 63 year-old right-hand dominant female who comes in complaining of pain, numbness and tingling on her left hand with numbness at the basilar area of her thumb, thumb through middle fingers for the past two weeks with the numbness and tingling. She recalls no specific history of injury to the hand or neck. She is not awakened at night.  She has not taken anything for this other than aspirin.  She has worn a cockup splint. She has history of arthritis and diabetes.  No history of thyroid problems or gout.  She is not able to take nonsteroidal anti-inflammatories.  She complains of constant throbbing, aching numbness with a feeling of swelling, weakness.  She feels it is getting worse.  She has had her nerve conductions done by Dr. Zebedee Iba revealing carpal tunnel syndrome bilaterally, left much greater than right with a motor delay of 6.9 on the left side, 4.9 on the right side, sensory delay of 4.6 on the left, 3.8 on the right, amplitude diminution of 7.7 on the left and 14 on the right.   ALLERGIES:   NSAIDs and Crestor   MEDICATIONS:    Cozaar, atenolol, oxybutynin, Tradjenta, pravastatin, losartan, atorvastatin, vitamin d, vitamin b-12, cinnamon and omega-3.  SURGICAL HISTORY:    Left ankle surgery, hysterectomy.  FAMILY MEDICAL HISTORY:    Positive for diabetes, heart disease, high blood pressure and arthritis.  SOCIAL HISTORY:     She does not smoke.  She drinks socially.  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  REVIEW OF SYSTEMS:    Positive for glasses, cataracts, high blood pressure, easy bruising, otherwise negative.  Jessica Bowers is an 63 y.o. female.   Chief Complaint: CTS left HPI: see above  Past Medical History  Diagnosis Date  . Obesity   . Hyperlipidemia   . Diabetes   . HTN (hypertension)   . GERD (gastroesophageal reflux disease)   . CKD (chronic kidney disease)   . Patellar tendinitis   . LEG PAIN, LEFT   . Arthritis   . Varicose veins      Past Surgical History  Procedure Laterality Date  . Tonsillectomy    . Lasik    . Knee surgery  2005    right  . Colonoscopy    . Abdominal hysterectomy  1997  . Excision haglund's deformity with achilles tendon repair Left 05/06/2013    Procedure: LEFT ACHILLES DEBRIDEMENT AND RESECTION / HAGLUND'S EXCISION;  Surgeon: Wylene Simmer, MD;  Location: [facility];  Service: Orthopedics;  Laterality: Left;  . Gastrocnemius recession Left 05/06/2013    Procedure: LEFT GASTROC RECESSION;  Surgeon: Wylene Simmer, MD;  Location: [facility];  Service: Orthopedics;  Laterality: Left;    Family History  Problem Relation Age of Onset  . Hypertension Father   . Diabetes Father   . Hyperlipidemia Mother   . CAD Mother    Social History:  reports that she has never smoked. She has never used smokeless tobacco. She reports that she drinks alcohol. She reports that she does not use illicit drugs.  Allergies:  Allergies  Allergen Reactions  . Ace Inhibitors Cough  . Crestor [Rosuvastatin] Other (See Comments)    Leg cramps  . Glucophage [Metformin] Other (See Comments)    headaches  . Nsaids Nausea And Vomiting    No prescriptions prior to admission    Results for orders placed or performed during the hospital encounter of 04/07/14 (from the past  48 hour(s))  Basic metabolic panel     Status: Abnormal   Collection Time: 04/06/14 10:00 AM  Result Value Ref Range   Sodium 139 135 - 145 mmol/L    Comment: Please note change in reference range.   Potassium 4.3 3.5 - 5.1 mmol/L    Comment: Please note change in reference range.   Chloride 106 96 - 112 mEq/L   CO2 26 19 - 32 mmol/L   Glucose, Bld 138 (H) 70 - 99 mg/dL   BUN 12 6 - 23 mg/dL   Creatinine, Ser 0.82 0.50 - 1.10 mg/dL   Calcium 9.3 8.4 - 10.5 mg/dL   GFR calc non Af Amer 76 (L) >90 mL/min   GFR calc Af Amer 88 (L) >90 mL/min    Comment: (NOTE) The eGFR has been calculated using the CKD EPI  equation. This calculation has not been validated in all clinical situations. eGFR's persistently <90 mL/min signify possible Chronic Kidney Disease.    Anion gap 7 5 - 15    No results found.   Pertinent items are noted in HPI.  Height 5' 6"  (1.676 m), weight 104.327 kg (230 lb).  General appearance: alert, cooperative and appears stated age Head: Normocephalic, without obvious abnormality Neck: no JVD Resp: clear to auscultation bilaterally Cardio: regular rate and rhythm, S1, S2 normal, no murmur, click, rub or gallop GI: soft, non-tender; bowel sounds normal; no masses,  no organomegaly Extremities: numbness left hand Pulses: 2+ and symmetric Skin: Skin color, texture, turgor normal. No rashes or lesions Neurologic: Grossly normal Incision/Wound: na  Assessment/Plan RADIOGRAPHS:     X-rays of her hands reveal Eaton stage 3 CMC arthritis with arthritis at PIP, DIP joints of all of her fingers, slightly to the MP and IP joint of her thumb.   X-rays of her cervical spine reveal mild disc space narrowing at C5-6, 6-7, otherwise negative.    DIAGNOSIS:     Cervicalgia along with degenerative arthritis carpometacarpal joint of her thumb.Carpal tunnel syndrome.    We have discussed this with her. She is aware that she has bilateral carpal tunnel syndrome left much greater than right. She has relatively constant numbness and tingling on her left side and would like to proceed to have this surgically release. She is aware of the possibility of injection but would like to proceed with surgical intervention. Pre, peri and post op care are discussed along with risks and complications. Patient is aware there is no guarantee with surgery, possibility of infection, injury to arteries, nerves, and tendons, incomplete relief and dystrophy. She is scheduled for left carpal tunnel release as an outpatient under regional anesthesia. Questions are invited and answered to her  satisfaction.  Donnamarie Shankles R 04/07/2014, 6:17 AM

## 2014-04-11 ENCOUNTER — Encounter (HOSPITAL_BASED_OUTPATIENT_CLINIC_OR_DEPARTMENT_OTHER): Payer: Self-pay | Admitting: Orthopedic Surgery

## 2014-04-13 ENCOUNTER — Ambulatory Visit: Payer: Self-pay | Admitting: Podiatry

## 2014-04-21 ENCOUNTER — Ambulatory Visit: Payer: Self-pay

## 2014-04-21 ENCOUNTER — Ambulatory Visit (INDEPENDENT_AMBULATORY_CARE_PROVIDER_SITE_OTHER): Payer: 59

## 2014-04-21 ENCOUNTER — Encounter: Payer: Self-pay | Admitting: Podiatry

## 2014-04-21 ENCOUNTER — Ambulatory Visit (INDEPENDENT_AMBULATORY_CARE_PROVIDER_SITE_OTHER): Payer: 59 | Admitting: Podiatry

## 2014-04-21 VITALS — BP 150/75 | HR 74 | Resp 16

## 2014-04-21 DIAGNOSIS — M779 Enthesopathy, unspecified: Secondary | ICD-10-CM

## 2014-04-21 DIAGNOSIS — M79674 Pain in right toe(s): Secondary | ICD-10-CM

## 2014-04-21 LAB — URIC ACID: Uric Acid, Serum: 3.7 mg/dL (ref 2.4–7.0)

## 2014-04-21 LAB — RHEUMATOID FACTOR: Rhuematoid fact SerPl-aCnc: <10 IU/mL (ref <=14)

## 2014-04-21 MED ORDER — TRIAMCINOLONE ACETONIDE 10 MG/ML IJ SUSP
10.0000 mg | Freq: Once | INTRAMUSCULAR | Status: AC
  Administered 2014-04-21: 10 mg

## 2014-04-21 NOTE — Progress Notes (Signed)
   Subjective:    Patient ID: Jessica Bowers, female    DOB: 08-30-1950, 64 y.o.   MRN: [id number]  HPI Comments: "I have a problem with this toe"  Patient c/o tenderness and stiffness 1st toe right for about 1-2 months. The toe appears swollen and there is some redness. PCP evaluated-said wasn't gout, so referred here.  Foot Pain Associated symptoms include arthralgias.      Review of Systems  Musculoskeletal: Positive for arthralgias.  Hematological: Bruises/bleeds easily.  All other systems reviewed and are negative.      Objective:   Physical Exam        Assessment & Plan:

## 2014-04-21 NOTE — Progress Notes (Signed)
Subjective:     Patient ID: Jessica Bowers, female   DOB: 1950/05/14, 64 y.o.   MRN: [id number]  HPI patient presents with pain in her right big toe joint not sure as to what happened stating that it's been present for a couple months and has become swollen and at times painful. Also has trouble with knee and hand and finger joints   Review of Systems  All other systems reviewed and are negative.      Objective:   Physical Exam  Constitutional: She is oriented to person, place, and time.  Cardiovascular: Intact distal pulses.   Musculoskeletal: Normal range of motion.  Neurological: She is oriented to person, place, and time.  Skin: Skin is warm.  Nursing note and vitals reviewed.  neurovascular status intact with muscle strength adequate and range of motion subtalar and midtarsal joint within normal limits. Patient does have good digital perfusion is well oriented and does at the interphalangeal joint of the right hallux have quite a bit of swelling and enlargement and mild reduction of motion first MPJ. Patient's toes do not indicate any other arthritic issues     Assessment:     Possible arthritis or gout of the first and he J and the interphalangeal joint right hallux with inflammation of the interphalangeal joint    Plan:     H&P and x-rays reviewed and I am consent for blood work to rule out pathology. Injected the interphalangeal joint right 3 mg Kenalog 5 mg Xylocaine and advised on padding and if symptoms were to get worse patient's to let us know immediately review of x-rays will be done

## 2014-04-22 LAB — SEDIMENTATION RATE: SED RATE: 5 mm/h (ref 0–22)

## 2014-04-22 LAB — ANA: ANA: NEGATIVE

## 2014-08-29 ENCOUNTER — Encounter: Payer: Self-pay | Admitting: *Deleted

## 2014-08-29 ENCOUNTER — Encounter: Payer: Self-pay | Admitting: Cardiology

## 2014-08-29 ENCOUNTER — Ambulatory Visit (INDEPENDENT_AMBULATORY_CARE_PROVIDER_SITE_OTHER): Payer: 59 | Admitting: Cardiology

## 2014-08-29 VITALS — BP 130/82 | HR 57 | Ht 66.0 in | Wt 254.0 lb

## 2014-08-29 DIAGNOSIS — Z01810 Encounter for preprocedural cardiovascular examination: Secondary | ICD-10-CM

## 2014-08-29 DIAGNOSIS — I10 Essential (primary) hypertension: Secondary | ICD-10-CM | POA: Diagnosis not present

## 2014-08-29 NOTE — Patient Instructions (Signed)
Medication Instructions:  Your physician recommends that you continue on your current medications as directed. Please refer to the Current Medication list given to you today.  Follow-Up: Follow up in 1 year with Dr. Skains.  You will receive a letter in the mail 2 months before you are due.  Please call us when you receive this letter to schedule your follow up appointment.  Thank you for choosing  [facility]!!       

## 2014-08-29 NOTE — Progress Notes (Signed)
[facility]. [address], [address], [place]  [address] Phone: [phone] Fax:  [phone]  Date:  08/29/2014   ID:  Kiyani, Jernigan 02-10-1951, MRN [id number]  PCP:  Tawanna Solo, MD   History of Present Illness: Jessica Bowers is a 64 y.o. female here for follow up. Prior visit for left Achilles tendon repair by Dr. Georgena Spurling (Jan 29th2015). Here today for total right knee arthroplasty cardiovascular preop evaluation, Dr. Wynelle Link (August).  Previously she was seen secondary to chest discomfort. She has a history of diabetes, hypertension. Previously she has had 4 separate episodes of chest discomfort/tightness including a 1-1/2 hour episode that began while sitting at her desk. She felt somewhat dyspneic with her symptoms. Felt like she needed to sit up straight to relieve. She "knows" it was not indigestion., Non exertional. Although Sunday, did feels some pain when cleaning out the attic. Subsided. 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 CKD. She took an aspirin which seemed to help. She has not tolerated statins in the past. Has diabetes, obesity, hypertension. Her previous hemoglobin A1c was 6.6, LDL 62 EKG on 12/11/12 shows sinus bradycardia rate 56 with no other abnormalities.  Because of the chest pain, she underwent a nuclear stress test: 9/14 NUC Mild area of reversible ischemia in the apex/distal anteroseptal wall. Normal ejection fraction. Overall low risk test. medical management. We had lengthy discussion about the test. Her defect could have been breast attenuation.  Rare palpitions. Her weight has significantly increased over the past 6 months. She previously was 230 pounds, currently 254 pounds. Likely driven in part by arthralgias.  She will be retiring upon [facility].  Once again no chest pain, shortness of breath, syncopal episodes, heart failure-like symptoms.   Wt Readings from Last 3 Encounters:  08/29/14 254 lb (115.214 kg)    04/07/14 246 lb (111.585 kg)  03/28/14 230 lb (104.327 kg)     Past Medical History  Diagnosis Date  . Obesity   . Hyperlipidemia   . Diabetes   . HTN (hypertension)   . GERD (gastroesophageal reflux disease)   . CKD (chronic kidney disease)   . Patellar tendinitis   . LEG PAIN, LEFT   . Arthritis   . Varicose veins     Past Surgical History  Procedure Laterality Date  . Tonsillectomy    . Lasik    . Knee surgery  2005    right  . Colonoscopy    . Abdominal hysterectomy  1997  . Excision haglund's deformity with achilles tendon repair Left 05/06/2013    Procedure: LEFT ACHILLES DEBRIDEMENT AND RESECTION / HAGLUND'S EXCISION;  Surgeon: Wylene Simmer, MD;  Location: [place];  Service: Orthopedics;  Laterality: Left;  . Gastrocnemius recession Left 05/06/2013    Procedure: LEFT GASTROC RECESSION;  Surgeon: Wylene Simmer, MD;  Location: [facility];  Service: Orthopedics;  Laterality: Left;  . Carpal tunnel release Left 04/07/2014    Procedure: LEFT CARPAL TUNNEL RELEASE;  Surgeon: Daryll Brod, MD;  Location: [facility];  Service: Orthopedics;  Laterality: Left;    Current Outpatient Prescriptions  Medication Sig Dispense Refill  . atenolol (TENORMIN) 50 MG tablet Take 50 mg by mouth daily.    Marland Kitchen atorvastatin (LIPITOR) 10 MG tablet Take 10 mg by mouth daily.     . cholecalciferol (VITAMIN D) 1000 UNITS tablet Take 1,000 Units by mouth daily.    Marland Kitchen GNP CINNAMON  PO Take by mouth.    Marland Kitchen HYDROcodone-acetaminophen (NORCO) 5-325 MG per tablet Take 1 tablet by mouth every 6 (six) hours as needed for moderate pain. 30 tablet 0  . losartan (COZAAR) 100 MG tablet Take 100 mg by mouth daily.    . Multiple Vitamin (MULTIVITAMIN) capsule Take 1 capsule by mouth daily.    . Omega 3 1200 MG CAPS Take by mouth.    . oxybutynin (DITROPAN-XL) 10 MG 24 hr tablet Take 10 mg by mouth every morning.     . sitaGLIPtin (JANUVIA) 25 MG tablet Take 25 mg by mouth  daily.    . vitamin B-12 (CYANOCOBALAMIN) 500 MCG tablet Take 500 mcg by mouth daily.     No current facility-administered medications for this visit.    Allergies:    Allergies  Allergen Reactions  . Ace Inhibitors Cough  . Crestor [Rosuvastatin] Other (See Comments)    Leg cramps  . Ezetimibe     zetia  . Glucophage [Metformin] Other (See Comments)    headaches  . Metformin And Related   . Nsaids Nausea And Vomiting  . Pioglitazone   . Vasotec  [Enalaprilat]     Social History:  The patient  reports that she has never smoked. She has never used smokeless tobacco. She reports that she drinks alcohol. She reports that she does not use illicit drugs.   ROS:  Please see the history of present illness.   No CAD, no bleeding, no orthopnea, no PND    PHYSICAL EXAM: VS:  BP 130/82 mmHg  Pulse 57  Ht 5\' 6"  (1.676 m)  Wt 254 lb (115.214 kg)  BMI 41.02 kg/m2 Well nourished, well developed, in no acute distress HEENT: normal Neck: no JVD Cardiac:  normal S1, S2; RRR; no murmur Lungs:  clear to auscultation bilaterally, no wheezing, rhonchi or rales Abd: soft, nontender, no hepatomegalyOverweight Ext: no edemacompression hose.  Skin: warm and dry Neuro: no focal abnormalities noted  EKG:  08/29/14-sinus bradycardia rate 57 with no other abnormalities-previous Sinus rhythm, no other changes. Heart rate 61.     ASSESSMENT AND PLAN:  1. Preoperative cardiovascular risk-knee replacement Nuclear stress test in September of 2014 was overall low risk showing only a mild apical/distal anteroseptal defect, likely breast attenuation. We continued with medical management and she has not had any further significant chest pain episodes. Continue with beta blocker. She may proceed with surgery with low to moderate cardiovascular risk, mainly stemming from body habitus. Since her last visit, her Achilles tendon surgery went well, she is also had carpal tunnel surgery and vein stripping/laser  surgery. 2. Chest pain-no further significant episodes. Continue with aggressive medical management. We once again discussed how diabetes is a coronary artery disease equivalent. 3. Obesity-continue to encourage weight loss. BMI recently has increased greater than 40. Morbid obesity. 4. Hyperlipidemia-continue with Lipitor 10 mg.She was at one point on 20 mg but began to have cramping in her legs. She backed down to 10 mg. She is doing well on this dosage. Last LDL was 62 on 08/25/13. 5. Diabetes-continuing to work with Dr. Buddy Duty. Discussed the importance of weight loss. 6. 1 year f/u.  Signed, Candee Furbish, MD [facility]  08/29/2014 9:03 AM

## 2014-10-03 ENCOUNTER — Other Ambulatory Visit: Payer: Self-pay

## 2014-11-02 ENCOUNTER — Ambulatory Visit: Payer: Self-pay | Admitting: Orthopedic Surgery

## 2014-11-02 NOTE — Progress Notes (Signed)
Preoperative surgical orders have been place into the Epic hospital system for Jessica Bowers on 11/02/2014, 8:14 AM  by Mickel Crow for surgery on 11/28/2014.  Preop Total Knee orders including Experal, IV Tylenol, and IV Decadron as long as there are no contraindications to the above medications. Arlee Muslim, PA-C

## 2014-11-15 ENCOUNTER — Encounter (HOSPITAL_COMMUNITY)
Admission: RE | Admit: 2014-11-15 | Discharge: 2014-11-15 | Disposition: A | Discharge status: Home / Self Care | Payer: 59 | Source: Ambulatory Visit | Attending: Orthopedic Surgery | Admitting: Orthopedic Surgery

## 2014-11-15 ENCOUNTER — Encounter (HOSPITAL_COMMUNITY): Payer: Self-pay

## 2014-11-15 DIAGNOSIS — Z01818 Encounter for other preprocedural examination: Secondary | ICD-10-CM | POA: Diagnosis not present

## 2014-11-15 HISTORY — DX: Zoster without complications: B02.9

## 2014-11-15 HISTORY — DX: Mumps without complication: B26.9

## 2014-11-15 HISTORY — DX: Personal history of other infectious and parasitic diseases: Z86.19

## 2014-11-15 HISTORY — DX: Measles without complication: B05.9

## 2014-11-15 HISTORY — DX: Tremor, unspecified: R25.1

## 2014-11-15 HISTORY — DX: Asymptomatic varicose veins of unspecified lower extremity: I83.90

## 2014-11-15 HISTORY — DX: Bronchitis, not specified as acute or chronic: J40

## 2014-11-15 LAB — URINALYSIS, ROUTINE W REFLEX MICROSCOPIC
Bilirubin Urine: NEGATIVE
Glucose, UA: NEGATIVE mg/dL
Hgb urine dipstick: NEGATIVE
Ketones, ur: NEGATIVE mg/dL
Leukocytes, UA: NEGATIVE
Nitrite: NEGATIVE
PROTEIN: NEGATIVE mg/dL
Specific Gravity, Urine: 1.012 (ref 1.005–1.030)
Urobilinogen, UA: 0.2 mg/dL (ref 0.0–1.0)
pH: 7 (ref 5.0–8.0)

## 2014-11-15 LAB — COMPREHENSIVE METABOLIC PANEL
ALBUMIN: 4.2 g/dL (ref 3.5–5.0)
ALT: 37 U/L (ref 14–54)
AST: 33 U/L (ref 15–41)
Alkaline Phosphatase: 90 U/L (ref 38–126)
Anion gap: 11 (ref 5–15)
BILIRUBIN TOTAL: 1.1 mg/dL (ref 0.3–1.2)
BUN: 16 mg/dL (ref 6–20)
CO2: 25 mmol/L (ref 22–32)
Calcium: 9.9 mg/dL (ref 8.9–10.3)
Chloride: 104 mmol/L (ref 101–111)
Creatinine, Ser: 0.81 mg/dL (ref 0.44–1.00)
GFR calc Af Amer: >60 mL/min (ref >60)
GFR calc non Af Amer: >60 mL/min (ref >60)
Glucose, Bld: 136 mg/dL — ABNORMAL HIGH (ref 65–99)
Potassium: 4.3 mmol/L (ref 3.5–5.1)
SODIUM: 140 mmol/L (ref 135–145)
Total Protein: 7.4 g/dL (ref 6.5–8.1)

## 2014-11-15 LAB — SURGICAL PCR SCREEN
MRSA, PCR: NEGATIVE
STAPHYLOCOCCUS AUREUS: NEGATIVE

## 2014-11-15 LAB — PROTIME-INR
INR: 1.1 (ref 0.00–1.49)
Prothrombin Time: 14.4 seconds (ref 11.6–15.2)

## 2014-11-15 LAB — APTT: aPTT: 33 seconds (ref 24–37)

## 2014-11-15 NOTE — Progress Notes (Signed)
EKG per epic 08/29/2014  Clearance note on chart per Dr Skains/cardiology 08/29/2014 including H&P  CBCD results per chart 11/02/2014  Clearance note per Dr Sabra Heck 11/02/2014

## 2014-11-15 NOTE — Patient Instructions (Signed)
Jessica Bowers  11/15/2014   Your procedure is scheduled on: Monday November 28, 2014  Report to [facility] Main  Entrance take [place]  elevators to 3rd floor to  [place] at 7:30 AM.  Call this number if you have problems the morning of surgery [phone]   Remember: ONLY 1 PERSON MAY GO WITH YOU TO SHORT STAY TO GET  READY MORNING OF [place].  Do not eat food or drink liquids :After Midnight.     Take these medicines the morning of surgery with A SIP OF WATER: Atenolol (Tenormin)                               You may not have any metal on your body including hair pins and              piercings  Do not wear jewelry, make-up, lotions, powders or perfumes, deodorant             Do not wear nail polish.  Do not shave  48 hours prior to surgery.                Do not bring valuables to the hospital. [place].  Contacts, dentures or bridgework may not be worn into surgery.  Leave suitcase in the car. After surgery it may be brought to your room.                  Please read over the following fact sheets you were given:MRSA INFORMATION SHEET; INCENTIVE SPIROMETER; BLOOD TRANSFUSION INFORMATION SHEET _____________________________________________________________________             [facility] - Preparing for Surgery Before surgery, you can play an important role.  Because skin is not sterile, your skin needs to be as free of germs as possible.  You can reduce the number of germs on your skin by washing with CHG (chlorahexidine gluconate) soap before surgery.  CHG is an antiseptic cleaner which kills germs and bonds with the skin to continue killing germs even after washing. Please DO NOT use if you have an allergy to CHG or antibacterial soaps.  If your skin becomes reddened/irritated stop using the CHG and inform your nurse when you arrive at Short Stay. Do not shave (including legs and underarms) for  at least 48 hours prior to the first CHG shower.  You may shave your face/neck. Please follow these instructions carefully:  1.  Shower with CHG Soap the night before surgery and the  morning of Surgery.  2.  If you choose to wash your hair, wash your hair first as usual with your  normal  shampoo.  3.  After you shampoo, rinse your hair and body thoroughly to remove the  shampoo.                           4.  Use CHG as you would any other liquid soap.  You can apply chg directly  to the skin and wash                       Gently with a scrungie or clean washcloth.  5.  Apply  the CHG Soap to your body ONLY FROM THE NECK DOWN.   Do not use on face/ open                           Wound or open sores. Avoid contact with eyes, ears mouth and genitals (private parts).                       Wash face,  Genitals (private parts) with your normal soap.             6.  Wash thoroughly, paying special attention to the area where your surgery  will be performed.  7.  Thoroughly rinse your body with warm water from the neck down.  8.  DO NOT shower/wash with your normal soap after using and rinsing off  the CHG Soap.                9.  Pat yourself dry with a clean towel.            10.  Wear clean pajamas.            11.  Place clean sheets on your bed the night of your first shower and do not  sleep with pets. Day of Surgery : Do not apply any lotions/deodorants the morning of surgery.  Please wear clean clothes to the hospital/surgery center.  FAILURE TO FOLLOW THESE INSTRUCTIONS MAY RESULT IN THE CANCELLATION OF YOUR SURGERY PATIENT SIGNATURE_________________________________  NURSE SIGNATURE__________________________________  ________________________________________________________________________   Adam Phenix  An incentive spirometer is a tool that can help keep your lungs clear and active. This tool measures how well you are filling your lungs with each breath. Taking long deep  breaths may help reverse or decrease the chance of developing breathing (pulmonary) problems (especially infection) following:  A long period of time when you are unable to move or be active. BEFORE THE PROCEDURE   If the spirometer includes an indicator to show your best effort, your nurse or respiratory therapist will set it to a desired goal.  If possible, sit up straight or lean slightly forward. Try not to slouch.  Hold the incentive spirometer in an upright position. INSTRUCTIONS FOR USE   Sit on the edge of your bed if possible, or sit up as far as you can in bed or on a chair.  Hold the incentive spirometer in an upright position.  Breathe out normally.  Place the mouthpiece in your mouth and seal your lips tightly around it.  Breathe in slowly and as deeply as possible, raising the piston or the ball toward the top of the column.  Hold your breath for 3-5 seconds or for as long as possible. Allow the piston or ball to fall to the bottom of the column.  Remove the mouthpiece from your mouth and breathe out normally.  Rest for a few seconds and repeat Steps 1 through 7 at least 10 times every 1-2 hours when you are awake. Take your time and take a few normal breaths between deep breaths.  The spirometer may include an indicator to show your best effort. Use the indicator as a goal to work toward during each repetition.  After each set of 10 deep breaths, practice coughing to be sure your lungs are clear. If you have an incision (the cut made at the time of surgery), support your incision when coughing by placing a pillow or rolled  up towels firmly against it. Once you are able to get out of bed, walk around indoors and cough well. You may stop using the incentive spirometer when instructed by your caregiver.  RISKS AND COMPLICATIONS  Take your time so you do not get dizzy or light-headed.  If you are in pain, you may need to take or ask for pain medication before doing  incentive spirometry. It is harder to take a deep breath if you are having pain. AFTER USE  Rest and breathe slowly and easily.  It can be helpful to keep track of a log of your progress. Your caregiver can provide you with a simple table to help with this. If you are using the spirometer at home, follow these instructions: [place] IF:   You are having difficultly using the spirometer.  You have trouble using the spirometer as often as instructed.  Your pain medication is not giving enough relief while using the spirometer.  You develop fever of 100.5 F (38.1 C) or higher. SEEK IMMEDIATE MEDICAL CARE IF:   You cough up bloody sputum that had not been present before.  You develop fever of 102 F (38.9 C) or greater.  You develop worsening pain at or near the incision site. MAKE SURE YOU:   Understand these instructions.  Will watch your condition.  Will get help right away if you are not doing well or get worse. Document Released: 08/05/2006 Document Revised: 06/17/2011 Document Reviewed: 10/06/2006 Exit[employer] Patient Information 2014 [employer], [place].   ________________________________________________________________________  WHAT IS A BLOOD TRANSFUSION? Blood Transfusion Information  A transfusion is the replacement of blood or some of its parts. Blood is made up of multiple cells which provide different functions.  Red blood cells carry oxygen and are used for blood loss replacement.  White blood cells fight against infection.  Platelets control bleeding.  Plasma helps clot blood.  Other blood products are available for specialized needs, such as hemophilia or other clotting disorders. BEFORE THE TRANSFUSION  Who gives blood for transfusions?   Healthy volunteers who are fully evaluated to make sure their blood is safe. This is blood bank blood. Transfusion therapy is the safest it has ever been in the practice of medicine. Before blood is taken from a  donor, a complete history is taken to make sure that person has no history of diseases nor engages in risky social behavior (examples are intravenous drug use or sexual activity with multiple partners). The donor's travel history is screened to minimize risk of transmitting infections, such as malaria. The donated blood is tested for signs of infectious diseases, such as HIV and hepatitis. The blood is then tested to be sure it is compatible with you in order to minimize the chance of a transfusion reaction. If you or a relative donates blood, this is often done in anticipation of surgery and is not appropriate for emergency situations. It takes many days to process the donated blood. RISKS AND COMPLICATIONS Although transfusion therapy is very safe and saves many lives, the main dangers of transfusion include:   Getting an infectious disease.  Developing a transfusion reaction. This is an allergic reaction to something in the blood you were given. Every precaution is taken to prevent this. The decision to have a blood transfusion has been considered carefully by your caregiver before blood is given. Blood is not given unless the benefits outweigh the risks. AFTER THE TRANSFUSION  Right after receiving a blood transfusion, you will usually feel  much better and more energetic. This is especially true if your red blood cells have gotten low (anemic). The transfusion raises the level of the red blood cells which carry oxygen, and this usually causes an energy increase.  The nurse administering the transfusion will monitor you carefully for complications. HOME CARE INSTRUCTIONS  No special instructions are needed after a transfusion. You may find your energy is better. Speak with your caregiver about any limitations on activity for underlying diseases you may have. SEEK MEDICAL CARE IF:   Your condition is not improving after your transfusion.  You develop redness or irritation at the intravenous (IV)  site. SEEK IMMEDIATE MEDICAL CARE IF:  Any of the following symptoms occur over the next 12 hours:  Shaking chills.  You have a temperature by mouth above 102 F (38.9 C), not controlled by medicine.  Chest, back, or muscle pain.  People around you feel you are not acting correctly or are confused.  Shortness of breath or difficulty breathing.  Dizziness and fainting.  You get a rash or develop hives.  You have a decrease in urine output.  Your urine turns a dark color or changes to pink, red, or brown. Any of the following symptoms occur over the next 10 days:  You have a temperature by mouth above 102 F (38.9 C), not controlled by medicine.  Shortness of breath.  Weakness after normal activity.  The white part of the eye turns yellow (jaundice).  You have a decrease in the amount of urine or are urinating less often.  Your urine turns a dark color or changes to pink, red, or brown. Document Released: 03/22/2000 Document Revised: 06/17/2011 Document Reviewed: 11/09/2007 [facility] Patient Information 2014 [place], [place].  _______________________________________________________________________

## 2014-11-22 NOTE — H&P (Signed)
TOTAL KNEE ADMISSION H&P  Patient is being admitted for right total knee arthroplasty.  Subjective:  Chief Complaint:right knee pain.  HPI: [name], 64 y.o. female, has a history of pain and functional disability in the right knee due to arthritis and has failed non-surgical conservative treatments for greater than 12 weeks to includeNSAID's and/or analgesics, corticosteriod injections, viscosupplementation injections and activity modification.  Onset of symptoms was gradual, starting >10 years ago with gradually worsening course since that time. The patient noted prior procedures on the knee to include  arthroscopy and menisectomy on the right knee(s).  Patient currently rates pain in the right knee(s) at 8 out of 10 with activity. Patient has night pain, worsening of pain with activity and weight bearing, pain that interferes with activities of daily living, pain with passive range of motion, crepitus and joint swelling.  Patient has evidence of periarticular osteophytes and joint space narrowing by imaging studies. There is no active infection.  Patient Active Problem List   Diagnosis Date Noted  . Varicose veins of leg with complications 61/44/3154  . Varicose veins of lower extremities with other complications 00/86/7619  . Hyperlipidemia   . Diabetes   . HTN (hypertension)   . GERD (gastroesophageal reflux disease)   . CKD (chronic kidney disease)   . PATELLAR TENDINITIS 07/13/2009  . LEG PAIN, LEFT 07/13/2009   Past Medical History  Diagnosis Date  . Obesity   . Hyperlipidemia   . Diabetes   . HTN (hypertension)   . GERD (gastroesophageal reflux disease)   . CKD (chronic kidney disease)   . Patellar tendinitis   . LEG PAIN, LEFT   . Arthritis   . Varicose veins   . Varicose vein     bilat   . Bronchitis     hx of   . Shaking     hands  . Shingles     hx of   . History of chicken pox   . Measles     hx of   . Mumps     hx of     Past Surgical History   Procedure Laterality Date  . Tonsillectomy    . Lasik    . Knee surgery  2005    right  . Colonoscopy    . Abdominal hysterectomy  1997  . Excision haglund's deformity with achilles tendon repair Left 05/06/2013    Procedure: LEFT ACHILLES DEBRIDEMENT AND RESECTION / HAGLUND'S EXCISION;  Surgeon: Wylene Simmer, MD;  Location: [place];  Service: Orthopedics;  Laterality: Left;  . Gastrocnemius recession Left 05/06/2013    Procedure: LEFT GASTROC RECESSION;  Surgeon: Wylene Simmer, MD;  Location: [place];  Service: Orthopedics;  Laterality: Left;  . Carpal tunnel release Left 04/07/2014    Procedure: LEFT CARPAL TUNNEL RELEASE;  Surgeon: Daryll Brod, MD;  Location: [place];  Service: Orthopedics;  Laterality: Left;  . Varicose vein surgery    .  stab plebectomy      03/21/2014       Current outpatient prescriptions:  .  aspirin EC 81 MG tablet, Take 81 mg by mouth every morning., Disp: , Rfl:  .  atenolol (TENORMIN) 50 MG tablet, Take 50 mg by mouth every morning. , Disp: , Rfl:  .  atorvastatin (LIPITOR) 10 MG tablet, Take 10 mg by mouth every morning. , Disp: , Rfl:  .  cholecalciferol (VITAMIN D) 1000 UNITS tablet, Take 1,000 Units by mouth every  morning. , Disp: , Rfl:  .  GNP CINNAMON PO, Take 1 tablet by mouth every morning. , Disp: , Rfl:  .  losartan (COZAAR) 100 MG tablet, Take 100 mg by mouth every morning. , Disp: , Rfl:  .  Multiple Vitamin (MULTIVITAMIN) capsule, Take 1 capsule by mouth every morning. , Disp: , Rfl:  .  Omega 3 1200 MG CAPS, Take 1 capsule by mouth every morning. , Disp: , Rfl:  .  oxybutynin (DITROPAN-XL) 10 MG 24 hr tablet, Take 10 mg by mouth every morning. , Disp: , Rfl:  .  polyvinyl alcohol (LIQUIFILM TEARS) 1.4 % ophthalmic solution, Place 1 drop into both eyes 2 (two) times daily., Disp: , Rfl:  .  sitaGLIPtin (JANUVIA) 25 MG tablet, Take 25 mg by mouth every morning. , Disp: , Rfl:  .  vitamin B-12  (CYANOCOBALAMIN) 500 MCG tablet, Take 500 mcg by mouth every morning. , Disp: , Rfl:   Allergies  Allergen Reactions  . Ace Inhibitors Cough  . Crestor [Rosuvastatin] Other (See Comments)    Leg cramps  . Ezetimibe     zetia  . Glucophage [Metformin] Other (See Comments)    headaches  . Metformin And Related   . Nsaids Nausea And Vomiting  . Pioglitazone Other (See Comments)    Weight gain.   Michail Sermon [Enalaprilat] Other (See Comments)    Cough.     Social History  Substance Use Topics  . Smoking status: Never Smoker   . Smokeless tobacco: Never Used  . Alcohol Use: 0.0 oz/week    0 Standard drinks or equivalent per week     Comment: 2 glasses of wine weekly     Family History  Problem Relation Age of Onset  . Hypertension Father   . Diabetes Father   . Hyperlipidemia Mother   . CAD Mother   . Heart attack Mother   . Stroke Mother      Review of Systems  Constitutional: Negative.   HENT: Negative.   Eyes: Negative.   Respiratory: Negative.   Cardiovascular: Negative.   Gastrointestinal: Negative.   Genitourinary: Negative.   Musculoskeletal: Positive for myalgias and joint pain. Negative for back pain, falls and neck pain.  Skin: Negative.   Neurological: Negative.   Endo/Heme/Allergies: Negative.   Psychiatric/Behavioral: Negative.     Objective:  Physical Exam  Constitutional: She is oriented to person, place, and time. She appears well-developed. No distress.  Obese  HENT:  Head: Normocephalic and atraumatic.  Right Ear: External ear normal.  Left Ear: External ear normal.  Nose: Nose normal.  Mouth/Throat: Oropharynx is clear and moist.  Eyes: Conjunctivae and EOM are normal.  Neck: Normal range of motion. Neck supple.  Cardiovascular: Normal rate, regular rhythm, normal heart sounds and intact distal pulses.   No murmur heard. Respiratory: Effort normal. No respiratory distress. She has no wheezes.  GI: Soft. Bowel sounds are normal. She  exhibits no distension. There is no tenderness.  Musculoskeletal:       Right hip: Normal.       Left hip: Normal.       Right knee: She exhibits decreased range of motion and swelling. She exhibits no effusion and no erythema. Tenderness found. Lateral joint line tenderness noted.       Left knee: Normal.  Her right knee shows no effusion. Her range is about 5 to 120. There is marked crepitus on range of motion, tenderness medial greater than lateral with no  instability.   Neurological: She is alert and oriented to person, place, and time. She has normal strength and normal reflexes. No sensory deficit.  Skin: No rash noted. She is not diaphoretic. No erythema.  Psychiatric: She has a normal mood and affect. Her behavior is normal.    Vitals  Weight: 235 lb Height: 66in Body Surface Area: 2.14 m Body Mass Index: 37.93 kg/m  Pulse: 64 (Regular)  BP: 144/88 (Sitting, Left Arm, Standard)  Imaging Review Plain radiographs demonstrate severe degenerative joint disease of the right knee(s). The overall alignment ismild varus. The bone quality appears to be good for age and reported activity level.  Assessment/Plan:  End stage primary osteoarthritis, right knee   The patient history, physical examination, clinical judgment of the provider and imaging studies are consistent with end stage degenerative joint disease of the right knee(s) and total knee arthroplasty is deemed medically necessary. The treatment options including medical management, injection therapy arthroscopy and arthroplasty were discussed at length. The risks and benefits of total knee arthroplasty were presented and reviewed. The risks due to aseptic loosening, infection, stiffness, patella tracking problems, thromboembolic complications and other imponderables were discussed. The patient acknowledged the explanation, agreed to proceed with the plan and consent was signed. Patient is being admitted for inpatient  treatment for surgery, pain control, PT, OT, prophylactic antibiotics, VTE prophylaxis, progressive ambulation and ADL's and discharge planning. The patient is planning to be discharged home with home health services   TXA IV Wants ice circulating machine post op No bedroom/bathroom first floor but has chair lift upstairs PCP: Kathyrn Lass Cardio: Dr. Hope Pigeon, PA-C

## 2014-11-28 ENCOUNTER — Inpatient Hospital Stay (HOSPITAL_COMMUNITY)
Admission: RE | Admit: 2014-11-28 | Discharge: 2014-11-30 | DRG: 470 | Disposition: A | Discharge status: Home Health | Payer: 59 | Source: Ambulatory Visit | Attending: Orthopedic Surgery | Admitting: Orthopedic Surgery

## 2014-11-28 ENCOUNTER — Inpatient Hospital Stay (HOSPITAL_COMMUNITY): Payer: 59 | Admitting: Anesthesiology

## 2014-11-28 ENCOUNTER — Encounter (HOSPITAL_COMMUNITY): Payer: Self-pay | Admitting: *Deleted

## 2014-11-28 ENCOUNTER — Encounter (HOSPITAL_COMMUNITY)
Admission: RE | Disposition: A | Discharge status: Home Health | Payer: Self-pay | Source: Ambulatory Visit | Attending: Orthopedic Surgery

## 2014-11-28 DIAGNOSIS — Z823 Family history of stroke: Secondary | ICD-10-CM

## 2014-11-28 DIAGNOSIS — Z79899 Other long term (current) drug therapy: Secondary | ICD-10-CM | POA: Diagnosis not present

## 2014-11-28 DIAGNOSIS — E785 Hyperlipidemia, unspecified: Secondary | ICD-10-CM | POA: Diagnosis present

## 2014-11-28 DIAGNOSIS — K219 Gastro-esophageal reflux disease without esophagitis: Secondary | ICD-10-CM | POA: Diagnosis present

## 2014-11-28 DIAGNOSIS — E119 Type 2 diabetes mellitus without complications: Secondary | ICD-10-CM | POA: Diagnosis present

## 2014-11-28 DIAGNOSIS — Z6841 Body Mass Index (BMI) 40.0 and over, adult: Secondary | ICD-10-CM | POA: Diagnosis not present

## 2014-11-28 DIAGNOSIS — Z833 Family history of diabetes mellitus: Secondary | ICD-10-CM | POA: Diagnosis not present

## 2014-11-28 DIAGNOSIS — Z8249 Family history of ischemic heart disease and other diseases of the circulatory system: Secondary | ICD-10-CM | POA: Diagnosis not present

## 2014-11-28 DIAGNOSIS — Z01812 Encounter for preprocedural laboratory examination: Secondary | ICD-10-CM

## 2014-11-28 DIAGNOSIS — M25561 Pain in right knee: Secondary | ICD-10-CM | POA: Diagnosis present

## 2014-11-28 DIAGNOSIS — Z7982 Long term (current) use of aspirin: Secondary | ICD-10-CM

## 2014-11-28 DIAGNOSIS — M179 Osteoarthritis of knee, unspecified: Secondary | ICD-10-CM | POA: Diagnosis present

## 2014-11-28 DIAGNOSIS — N189 Chronic kidney disease, unspecified: Secondary | ICD-10-CM | POA: Diagnosis present

## 2014-11-28 DIAGNOSIS — E669 Obesity, unspecified: Secondary | ICD-10-CM | POA: Diagnosis present

## 2014-11-28 DIAGNOSIS — M1711 Unilateral primary osteoarthritis, right knee: Principal | ICD-10-CM | POA: Diagnosis present

## 2014-11-28 DIAGNOSIS — Z90710 Acquired absence of both cervix and uterus: Secondary | ICD-10-CM

## 2014-11-28 DIAGNOSIS — M1710 Unilateral primary osteoarthritis, unspecified knee: Secondary | ICD-10-CM | POA: Diagnosis present

## 2014-11-28 HISTORY — PX: TOTAL KNEE ARTHROPLASTY: SHX125

## 2014-11-28 LAB — GLUCOSE, CAPILLARY
GLUCOSE-CAPILLARY: 169 mg/dL — AB (ref 65–99)
GLUCOSE-CAPILLARY: 140 mg/dL — AB (ref 65–99)
Glucose-Capillary: 206 mg/dL — ABNORMAL HIGH (ref 65–99)
Glucose-Capillary: 242 mg/dL — ABNORMAL HIGH (ref 65–99)

## 2014-11-28 LAB — TYPE AND SCREEN
ABO/RH(D): A POS
Antibody Screen: NEGATIVE

## 2014-11-28 LAB — ABO/RH: ABO/RH(D): A POS

## 2014-11-28 SURGERY — ARTHROPLASTY, KNEE, TOTAL
Anesthesia: Monitor Anesthesia Care | Site: Knee | Laterality: Right

## 2014-11-28 MED ORDER — DEXAMETHASONE SODIUM PHOSPHATE 10 MG/ML IJ SOLN
10.0000 mg | Freq: Once | INTRAMUSCULAR | Status: AC
  Administered 2014-11-29: 10 mg via INTRAVENOUS
  Filled 2014-11-28: qty 1

## 2014-11-28 MED ORDER — ONDANSETRON HCL 4 MG/2ML IJ SOLN: 4.0000 mg | Freq: Four times a day (QID) | INTRAMUSCULAR | Status: DC | PRN

## 2014-11-28 MED ORDER — BUPIVACAINE LIPOSOME 1.3 % IJ SUSP
20.0000 mL | Freq: Once | INTRAMUSCULAR | Status: DC
  Filled 2014-11-28: qty 20

## 2014-11-28 MED ORDER — SODIUM CHLORIDE 0.9 % IJ SOLN: INTRAMUSCULAR | Status: DC | PRN

## 2014-11-28 MED ORDER — ACETAMINOPHEN 10 MG/ML IV SOLN
INTRAVENOUS | Status: AC
  Filled 2014-11-28: qty 100

## 2014-11-28 MED ORDER — MORPHINE SULFATE (PF) 2 MG/ML IV SOLN
1.0000 mg | INTRAVENOUS | Status: DC | PRN
  Administered 2014-11-28 – 2014-11-29 (×3): 1 mg via INTRAVENOUS
  Filled 2014-11-28 (×4): qty 1

## 2014-11-28 MED ORDER — DIPHENHYDRAMINE HCL 12.5 MG/5ML PO ELIX
12.5000 mg | ORAL_SOLUTION | ORAL | Status: DC | PRN
  Administered 2014-11-29: 12.5 mg via ORAL
  Filled 2014-11-28: qty 5

## 2014-11-28 MED ORDER — OXYCODONE HCL 5 MG/5ML PO SOLN
5.0000 mg | Freq: Once | ORAL | Status: DC | PRN
  Filled 2014-11-28: qty 5

## 2014-11-28 MED ORDER — ONDANSETRON HCL 4 MG/2ML IJ SOLN
4.0000 mg | Freq: Four times a day (QID) | INTRAMUSCULAR | Status: DC | PRN
  Administered 2014-11-29: 4 mg via INTRAVENOUS
  Filled 2014-11-28: qty 2

## 2014-11-28 MED ORDER — DEXAMETHASONE SODIUM PHOSPHATE 10 MG/ML IJ SOLN
INTRAMUSCULAR | Status: AC
  Filled 2014-11-28: qty 1

## 2014-11-28 MED ORDER — LOSARTAN POTASSIUM 50 MG PO TABS
100.0000 mg | ORAL_TABLET | Freq: Every morning | ORAL | Status: DC
  Administered 2014-11-29 – 2014-11-30 (×2): 100 mg via ORAL
  Filled 2014-11-28 (×2): qty 2

## 2014-11-28 MED ORDER — SODIUM CHLORIDE 0.9 % IV SOLN
INTRAVENOUS | Status: DC
  Administered 2014-11-28 – 2014-11-30 (×3): via INTRAVENOUS

## 2014-11-28 MED ORDER — BUPIVACAINE HCL 0.5 % IJ SOLN
INTRAMUSCULAR | Status: DC | PRN
  Administered 2014-11-28: 20 mL

## 2014-11-28 MED ORDER — OXYCODONE HCL 5 MG PO TABS
5.0000 mg | ORAL_TABLET | ORAL | Status: DC | PRN
  Administered 2014-11-28: 5 mg via ORAL
  Administered 2014-11-28 – 2014-11-30 (×9): 10 mg via ORAL
  Filled 2014-11-28 (×9): qty 2
  Filled 2014-11-28: qty 1
  Filled 2014-11-28: qty 2

## 2014-11-28 MED ORDER — TRANEXAMIC ACID 1000 MG/10ML IV SOLN
1000.0000 mg | INTRAVENOUS | Status: AC
  Administered 2014-11-28: 1000 mg via INTRAVENOUS
  Filled 2014-11-28: qty 10

## 2014-11-28 MED ORDER — 0.9 % SODIUM CHLORIDE (POUR BTL) OPTIME
TOPICAL | Status: DC | PRN
  Administered 2014-11-28: 1000 mL

## 2014-11-28 MED ORDER — MIDAZOLAM HCL 5 MG/5ML IJ SOLN
INTRAMUSCULAR | Status: DC | PRN
  Administered 2014-11-28: 2 mg via INTRAVENOUS

## 2014-11-28 MED ORDER — PROPOFOL 10 MG/ML IV BOLUS
INTRAVENOUS | Status: AC
  Filled 2014-11-28: qty 20

## 2014-11-28 MED ORDER — FENTANYL CITRATE (PF) 100 MCG/2ML IJ SOLN
INTRAMUSCULAR | Status: DC | PRN
  Administered 2014-11-28: 100 ug via INTRAVENOUS

## 2014-11-28 MED ORDER — BUPIVACAINE HCL 0.25 % IJ SOLN: INTRAMUSCULAR | Status: DC | PRN

## 2014-11-28 MED ORDER — RIVAROXABAN 10 MG PO TABS
10.0000 mg | ORAL_TABLET | Freq: Every day | ORAL | Status: DC
  Administered 2014-11-29 – 2014-11-30 (×2): 10 mg via ORAL
  Filled 2014-11-28 (×3): qty 1

## 2014-11-28 MED ORDER — OXYBUTYNIN CHLORIDE ER 10 MG PO TB24
10.0000 mg | ORAL_TABLET | Freq: Every morning | ORAL | Status: DC
  Administered 2014-11-29 – 2014-11-30 (×2): 10 mg via ORAL
  Filled 2014-11-28 (×2): qty 1

## 2014-11-28 MED ORDER — ACETAMINOPHEN 500 MG PO TABS
1000.0000 mg | ORAL_TABLET | Freq: Four times a day (QID) | ORAL | Status: AC
  Administered 2014-11-28: 1000 mg via ORAL
  Filled 2014-11-28 (×3): qty 2

## 2014-11-28 MED ORDER — LIDOCAINE HCL (CARDIAC) 20 MG/ML IV SOLN
INTRAVENOUS | Status: DC | PRN
  Administered 2014-11-28: 50 mg via INTRAVENOUS

## 2014-11-28 MED ORDER — METOCLOPRAMIDE HCL 5 MG/ML IJ SOLN: 5.0000 mg | Freq: Three times a day (TID) | INTRAMUSCULAR | Status: DC | PRN

## 2014-11-28 MED ORDER — METHOCARBAMOL 500 MG PO TABS
500.0000 mg | ORAL_TABLET | Freq: Four times a day (QID) | ORAL | Status: DC | PRN
  Administered 2014-11-28 – 2014-11-30 (×5): 500 mg via ORAL
  Filled 2014-11-28 (×5): qty 1

## 2014-11-28 MED ORDER — METOCLOPRAMIDE HCL 10 MG PO TABS: 5.0000 mg | ORAL_TABLET | Freq: Three times a day (TID) | ORAL | Status: DC | PRN

## 2014-11-28 MED ORDER — BISACODYL 10 MG RE SUPP: 10.0000 mg | Freq: Every day | RECTAL | Status: DC | PRN

## 2014-11-28 MED ORDER — FENTANYL CITRATE (PF) 100 MCG/2ML IJ SOLN
INTRAMUSCULAR | Status: AC
  Filled 2014-11-28: qty 4

## 2014-11-28 MED ORDER — BUPIVACAINE LIPOSOME 1.3 % IJ SUSP: INTRAMUSCULAR | Status: DC | PRN

## 2014-11-28 MED ORDER — CEFAZOLIN SODIUM-DEXTROSE 2-3 GM-% IV SOLR
2.0000 g | INTRAVENOUS | Status: AC
  Administered 2014-11-28: 2 g via INTRAVENOUS

## 2014-11-28 MED ORDER — FENTANYL CITRATE (PF) 100 MCG/2ML IJ SOLN: 25.0000 ug | INTRAMUSCULAR | Status: DC | PRN

## 2014-11-28 MED ORDER — LACTATED RINGERS IV SOLN
INTRAVENOUS | Status: DC
  Administered 2014-11-28: 1000 mL via INTRAVENOUS
  Administered 2014-11-28: 11:00:00 via INTRAVENOUS

## 2014-11-28 MED ORDER — ONDANSETRON HCL 4 MG/2ML IJ SOLN
INTRAMUSCULAR | Status: AC
  Filled 2014-11-28: qty 2

## 2014-11-28 MED ORDER — OXYCODONE HCL 5 MG PO TABS: 5.0000 mg | ORAL_TABLET | Freq: Once | ORAL | Status: DC | PRN

## 2014-11-28 MED ORDER — SODIUM CHLORIDE 0.9 % IV SOLN: INTRAVENOUS | Status: DC

## 2014-11-28 MED ORDER — TRAMADOL HCL 50 MG PO TABS
50.0000 mg | ORAL_TABLET | Freq: Four times a day (QID) | ORAL | Status: DC | PRN
  Administered 2014-11-29: 100 mg via ORAL
  Filled 2014-11-28 (×2): qty 1

## 2014-11-28 MED ORDER — FLEET ENEMA 7-19 GM/118ML RE ENEM: 1.0000 | ENEMA | Freq: Once | RECTAL | Status: DC | PRN

## 2014-11-28 MED ORDER — DEXAMETHASONE SODIUM PHOSPHATE 10 MG/ML IJ SOLN
10.0000 mg | Freq: Once | INTRAMUSCULAR | Status: AC
  Administered 2014-11-28: 10 mg via INTRAVENOUS

## 2014-11-28 MED ORDER — LIDOCAINE HCL (CARDIAC) 20 MG/ML IV SOLN
INTRAVENOUS | Status: AC
Start: 2014-11-28 — End: 2014-11-28
  Filled 2014-11-28: qty 5

## 2014-11-28 MED ORDER — PHENYLEPHRINE HCL 10 MG/ML IJ SOLN
INTRAMUSCULAR | Status: DC | PRN
  Administered 2014-11-28 (×2): 40 ug via INTRAVENOUS

## 2014-11-28 MED ORDER — CEFAZOLIN SODIUM-DEXTROSE 2-3 GM-% IV SOLR
INTRAVENOUS | Status: AC
  Filled 2014-11-28: qty 50

## 2014-11-28 MED ORDER — 0.9 % SODIUM CHLORIDE (POUR BTL) OPTIME: TOPICAL | Status: DC | PRN

## 2014-11-28 MED ORDER — ACETAMINOPHEN 10 MG/ML IV SOLN
1000.0000 mg | Freq: Once | INTRAVENOUS | Status: AC
  Administered 2014-11-28: 1000 mg via INTRAVENOUS
  Filled 2014-11-28: qty 100

## 2014-11-28 MED ORDER — BUPIVACAINE IN DEXTROSE 0.75-8.25 % IT SOLN
INTRATHECAL | Status: DC | PRN
  Administered 2014-11-28: 1.6 mL via INTRATHECAL

## 2014-11-28 MED ORDER — ONDANSETRON HCL 4 MG PO TABS: 4.0000 mg | ORAL_TABLET | Freq: Four times a day (QID) | ORAL | Status: DC | PRN

## 2014-11-28 MED ORDER — POLYETHYLENE GLYCOL 3350 17 G PO PACK: 17.0000 g | PACK | Freq: Every day | ORAL | Status: DC | PRN

## 2014-11-28 MED ORDER — CEFAZOLIN SODIUM-DEXTROSE 2-3 GM-% IV SOLR
2.0000 g | Freq: Four times a day (QID) | INTRAVENOUS | Status: AC
  Administered 2014-11-28 (×2): 2 g via INTRAVENOUS
  Filled 2014-11-28 (×3): qty 50

## 2014-11-28 MED ORDER — ONDANSETRON HCL 4 MG/2ML IJ SOLN
INTRAMUSCULAR | Status: DC | PRN
  Administered 2014-11-28: 4 mg via INTRAVENOUS

## 2014-11-28 MED ORDER — ATENOLOL 50 MG PO TABS
50.0000 mg | ORAL_TABLET | Freq: Every morning | ORAL | Status: DC
  Administered 2014-11-29 – 2014-11-30 (×2): 50 mg via ORAL
  Filled 2014-11-28 (×2): qty 1

## 2014-11-28 MED ORDER — SODIUM CHLORIDE 0.9 % IR SOLN: Status: DC | PRN

## 2014-11-28 MED ORDER — CHLORHEXIDINE GLUCONATE 4 % EX LIQD: 60.0000 mL | Freq: Once | CUTANEOUS | Status: DC

## 2014-11-28 MED ORDER — DOCUSATE SODIUM 100 MG PO CAPS
100.0000 mg | ORAL_CAPSULE | Freq: Two times a day (BID) | ORAL | Status: DC
  Administered 2014-11-28 – 2014-11-30 (×4): 100 mg via ORAL

## 2014-11-28 MED ORDER — SODIUM CHLORIDE 0.9 % IJ SOLN
INTRAMUSCULAR | Status: DC | PRN
  Administered 2014-11-28: 30 mL

## 2014-11-28 MED ORDER — BUPIVACAINE HCL (PF) 0.25 % IJ SOLN
INTRAMUSCULAR | Status: AC
  Filled 2014-11-28: qty 30

## 2014-11-28 MED ORDER — PHENYLEPHRINE 40 MCG/ML (10ML) SYRINGE FOR IV PUSH (FOR BLOOD PRESSURE SUPPORT)
PREFILLED_SYRINGE | INTRAVENOUS | Status: AC
Start: 2014-11-28 — End: 2014-11-28
  Filled 2014-11-28: qty 10

## 2014-11-28 MED ORDER — PROPOFOL INFUSION 10 MG/ML OPTIME
INTRAVENOUS | Status: DC | PRN
  Administered 2014-11-28: 75 ug/kg/min via INTRAVENOUS

## 2014-11-28 MED ORDER — ATORVASTATIN CALCIUM 10 MG PO TABS
10.0000 mg | ORAL_TABLET | Freq: Every morning | ORAL | Status: DC
  Administered 2014-11-29 – 2014-11-30 (×2): 10 mg via ORAL
  Filled 2014-11-28 (×2): qty 1

## 2014-11-28 MED ORDER — MENTHOL 3 MG MT LOZG: 1.0000 | LOZENGE | OROMUCOSAL | Status: DC | PRN

## 2014-11-28 MED ORDER — MIDAZOLAM HCL 2 MG/2ML IJ SOLN
INTRAMUSCULAR | Status: AC
  Filled 2014-11-28: qty 4

## 2014-11-28 MED ORDER — BUPIVACAINE LIPOSOME 1.3 % IJ SUSP
INTRAMUSCULAR | Status: DC | PRN
  Administered 2014-11-28: 20 mL

## 2014-11-28 MED ORDER — METHOCARBAMOL 1000 MG/10ML IJ SOLN
500.0000 mg | Freq: Four times a day (QID) | INTRAVENOUS | Status: DC | PRN
  Administered 2014-11-28: 500 mg via INTRAVENOUS
  Filled 2014-11-28 (×2): qty 5

## 2014-11-28 MED ORDER — PHENOL 1.4 % MT LIQD
1.0000 | OROMUCOSAL | Status: DC | PRN
  Filled 2014-11-28: qty 177

## 2014-11-28 MED ORDER — SODIUM CHLORIDE 0.9 % IR SOLN
Status: DC | PRN
  Administered 2014-11-28: 1000 mL

## 2014-11-28 MED ORDER — ACETAMINOPHEN 650 MG RE SUPP: 650.0000 mg | Freq: Four times a day (QID) | RECTAL | Status: DC | PRN

## 2014-11-28 MED ORDER — INSULIN ASPART 100 UNIT/ML ~~LOC~~ SOLN
0.0000 [IU] | Freq: Three times a day (TID) | SUBCUTANEOUS | Status: DC
  Administered 2014-11-28 – 2014-11-29 (×3): 5 [IU] via SUBCUTANEOUS
  Administered 2014-11-30: 3 [IU] via SUBCUTANEOUS

## 2014-11-28 MED ORDER — SODIUM CHLORIDE 0.9 % IJ SOLN
INTRAMUSCULAR | Status: AC
  Filled 2014-11-28: qty 50

## 2014-11-28 MED ORDER — ACETAMINOPHEN 325 MG PO TABS: 650.0000 mg | ORAL_TABLET | Freq: Four times a day (QID) | ORAL | Status: DC | PRN

## 2014-11-28 SURGICAL SUPPLY — 63 items
BAG DECANTER FOR FLEXI CONT (MISCELLANEOUS) ×2 IMPLANT
BAG SPEC THK2 15X12 ZIP CLS (MISCELLANEOUS) ×1
BAG ZIPLOCK 12X15 (MISCELLANEOUS) ×2 IMPLANT
BANDAGE ELASTIC 6 VELCRO ST LF (GAUZE/BANDAGES/DRESSINGS) ×2 IMPLANT
BANDAGE ESMARK 6X9 LF (GAUZE/BANDAGES/DRESSINGS) ×1 IMPLANT
BLADE SAG 18X100X1.27 (BLADE) ×2 IMPLANT
BLADE SAW SGTL 11.0X1.19X90.0M (BLADE) ×2 IMPLANT
BNDG CMPR 9X6 STRL LF SNTH (GAUZE/BANDAGES/DRESSINGS) ×1
BNDG ESMARK 6X9 LF (GAUZE/BANDAGES/DRESSINGS) ×2
BOWL SMART MIX CTS (DISPOSABLE) ×2 IMPLANT
CAPT KNEE TOTAL 3 ATTUNE ×1 IMPLANT
CEMENT HV SMART SET (Cement) ×4 IMPLANT
CUFF TOURN SGL QUICK 34 (TOURNIQUET CUFF) ×2
CUFF TRNQT CYL 34X4X40X1 (TOURNIQUET CUFF) ×1 IMPLANT
DECANTER SPIKE VIAL GLASS SM (MISCELLANEOUS) ×2 IMPLANT
DRAPE EXTREMITY T 121X128X90 (DRAPE) ×2 IMPLANT
DRAPE POUCH INSTRU U-SHP 10X18 (DRAPES) ×2 IMPLANT
DRAPE U-SHAPE 47X51 STRL (DRAPES) ×2 IMPLANT
DRSG ADAPTIC 3X8 NADH LF (GAUZE/BANDAGES/DRESSINGS) ×2 IMPLANT
DRSG PAD ABDOMINAL 8X10 ST (GAUZE/BANDAGES/DRESSINGS) ×2 IMPLANT
DURAPREP 26ML APPLICATOR (WOUND CARE) ×2 IMPLANT
ELECT REM PT RETURN 9FT ADLT (ELECTROSURGICAL) ×2
ELECTRODE REM PT RTRN 9FT ADLT (ELECTROSURGICAL) ×1 IMPLANT
EVACUATOR 1/8 PVC DRAIN (DRAIN) ×2 IMPLANT
FACESHIELD WRAPAROUND (MASK) ×10 IMPLANT
GAUZE SPONGE 4X4 12PLY STRL (GAUZE/BANDAGES/DRESSINGS) ×2 IMPLANT
GLOVE BIO SURGEON STRL SZ7.5 (GLOVE) IMPLANT
GLOVE BIO SURGEON STRL SZ8 (GLOVE) ×2 IMPLANT
GLOVE BIOGEL PI IND STRL 6.5 (GLOVE) IMPLANT
GLOVE BIOGEL PI IND STRL 8 (GLOVE) ×1 IMPLANT
GLOVE BIOGEL PI INDICATOR 6.5 (GLOVE)
GLOVE BIOGEL PI INDICATOR 8 (GLOVE) ×1
GLOVE SURG SS PI 6.5 STRL IVOR (GLOVE) IMPLANT
GOWN STRL REUS W/TWL LRG LVL3 (GOWN DISPOSABLE) ×2 IMPLANT
GOWN STRL REUS W/TWL XL LVL3 (GOWN DISPOSABLE) IMPLANT
HANDPIECE INTERPULSE COAX TIP (DISPOSABLE) ×2
IMMOBILIZER KNEE 20 (SOFTGOODS) ×2
IMMOBILIZER KNEE 20 THIGH 36 (SOFTGOODS) ×1 IMPLANT
KIT BASIN OR (CUSTOM PROCEDURE TRAY) ×2 IMPLANT
MANIFOLD NEPTUNE II (INSTRUMENTS) ×2 IMPLANT
NDL SAFETY ECLIPSE 18X1.5 (NEEDLE) ×2 IMPLANT
NEEDLE HYPO 18GX1.5 SHARP (NEEDLE) ×4
NS IRRIG 1000ML POUR BTL (IV SOLUTION) ×2 IMPLANT
PACK TOTAL JOINT (CUSTOM PROCEDURE TRAY) ×2 IMPLANT
PAD ABD 8X10 STRL (GAUZE/BANDAGES/DRESSINGS) ×1 IMPLANT
PADDING CAST COTTON 6X4 STRL (CAST SUPPLIES) ×2 IMPLANT
PEN SKIN MARKING BROAD (MISCELLANEOUS) ×2 IMPLANT
POSITIONER SURGICAL ARM (MISCELLANEOUS) ×2 IMPLANT
SET HNDPC FAN SPRY TIP SCT (DISPOSABLE) ×1 IMPLANT
STRIP CLOSURE SKIN 1/2X4 (GAUZE/BANDAGES/DRESSINGS) ×2 IMPLANT
SUCTION FRAZIER 12FR DISP (SUCTIONS) ×2 IMPLANT
SUT MNCRL AB 4-0 PS2 18 (SUTURE) ×2 IMPLANT
SUT VIC AB 2-0 CT1 27 (SUTURE) ×6
SUT VIC AB 2-0 CT1 TAPERPNT 27 (SUTURE) ×3 IMPLANT
SUT VLOC 180 0 24IN GS25 (SUTURE) ×2 IMPLANT
SYR 20CC LL (SYRINGE) ×2 IMPLANT
SYR 50ML LL SCALE MARK (SYRINGE) ×2 IMPLANT
TOWEL OR 17X26 10 PK STRL BLUE (TOWEL DISPOSABLE) ×2 IMPLANT
TOWEL OR NON WOVEN STRL DISP B (DISPOSABLE) IMPLANT
TRAY FOLEY W/METER SILVER 14FR (SET/KITS/TRAYS/PACK) ×2 IMPLANT
WATER STERILE IRR 1500ML POUR (IV SOLUTION) ×2 IMPLANT
WRAP KNEE MAXI GEL POST OP (GAUZE/BANDAGES/DRESSINGS) ×2 IMPLANT
YANKAUER SUCT BULB TIP 10FT TU (MISCELLANEOUS) ×2 IMPLANT

## 2014-11-28 NOTE — Anesthesia Preprocedure Evaluation (Signed)
Anesthesia Evaluation  Patient identified by MRN, date of birth, ID band Patient awake    Reviewed: Allergy & Precautions, NPO status , Patient's Chart, lab work & pertinent test results  Airway Mallampati: II   Neck ROM: full    Dental   Pulmonary neg pulmonary ROS,  breath sounds clear to auscultation        Cardiovascular hypertension, + Peripheral Vascular Disease Rhythm:regular Rate:Normal     Neuro/Psych    GI/Hepatic GERD-  ,  Endo/Other  diabetes, Type 2Morbid obesity  Renal/GU      Musculoskeletal  (+) Arthritis -,   Abdominal   Peds  Hematology   Anesthesia Other Findings   Reproductive/Obstetrics                             Anesthesia Physical Anesthesia Plan  ASA: II  Anesthesia Plan: MAC and Spinal   Post-op Pain Management:    Induction: Intravenous  Airway Management Planned: Simple Face Mask  Additional Equipment:   Intra-op Plan:   Post-operative Plan:   Informed Consent: I have reviewed the patients History and Physical, chart, labs and discussed the procedure including the risks, benefits and alternatives for the proposed anesthesia with the patient or authorized representative who has indicated his/her understanding and acceptance.     Plan Discussed with: CRNA, Anesthesiologist and Surgeon  Anesthesia Plan Comments:         Anesthesia Quick Evaluation

## 2014-11-28 NOTE — Anesthesia Postprocedure Evaluation (Signed)
  Anesthesia Post-op Note  Patient: Jessica Bowers  Procedure(s) Performed: Procedure(s): TOTAL RIGHT KNEE ARTHROPLASTY (Right)  Patient Location: PACU  Anesthesia Type:Spinal  Level of Consciousness: awake, alert  and oriented  Airway and Oxygen Therapy: Patient Spontanous Breathing  Post-op Pain: none  Post-op Assessment: Post-op Vital signs reviewed, Patient's Cardiovascular Status Stable and Respiratory Function Stable LLE Motor Response: Purposeful movement LLE Sensation: No sensation (absent), Tingling RLE Motor Response: Purposeful movement RLE Sensation: Tingling L Sensory Level: S1-Sole of foot, small toes R Sensory Level: S1-Sole of foot, small toes  Post-op Vital Signs: Reviewed and stable  Last Vitals:  Filed Vitals:   11/28/14 1605  BP: 156/76  Pulse: 51  Temp: 36.5 C  Resp: 16    Complications: No apparent anesthesia complications

## 2014-11-28 NOTE — Plan of Care (Signed)
Problem: Consults Goal: Diagnosis- Total Joint Replacement RTK

## 2014-11-28 NOTE — Interval H&P Note (Signed)
History and Physical Interval Note:  11/28/2014 9:29 AM  Jessica Bowers  has presented today for surgery, with the diagnosis of RIGHT KNEE OA   The various methods of treatment have been discussed with the patient and family. After consideration of risks, benefits and other options for treatment, the patient has consented to  Procedure(s): TOTAL RIGHT KNEE ARTHROPLASTY (Right) as a surgical intervention .  The patient's history has been reviewed, patient examined, no change in status, stable for surgery.  I have reviewed the patient's chart and labs.  Questions were answered to the patient's satisfaction.     Gearlean Alf

## 2014-11-28 NOTE — Transfer of Care (Signed)
Immediate Anesthesia Transfer of Care Note  Patient: Jessica Bowers  Procedure(s) Performed: Procedure(s): TOTAL RIGHT KNEE ARTHROPLASTY (Right)  Patient Location: PACU  Anesthesia Type:Regional and Spinal  Level of Consciousness: awake, alert  and oriented  Airway & Oxygen Therapy: Patient Spontanous Breathing and Patient connected to face mask oxygen  Post-op Assessment: Report given to RN and Post -op Vital signs reviewed and stable  Post vital signs: Reviewed and stable  Last Vitals:  Filed Vitals:   11/28/14 0747  BP: 156/89  Pulse: 66  Temp: 36.4 C  Resp: 16    Complications: No apparent anesthesia complications

## 2014-11-28 NOTE — Anesthesia Procedure Notes (Signed)
Spinal Patient location during procedure: OR End time: 11/28/2014 10:29 AM Staffing Resident/CRNA: Noralyn Pick D Performed by: resident/CRNA  Preanesthetic Checklist Completed: patient identified, site marked, surgical consent, pre-op evaluation, timeout performed, IV checked, risks and benefits discussed and monitors and equipment checked Spinal Block Patient position: sitting Prep: Betadine Patient monitoring: heart rate, continuous pulse ox and blood pressure Approach: midline Location: L3-4 Injection technique: single-shot Needle Needle type: Sprotte  Needle gauge: 24 G Needle length: 9 cm Additional Notes Expiration date of kit checked and confirmed. Patient tolerated procedure well, without complications.

## 2014-11-28 NOTE — Evaluation (Addendum)
Physical Therapy Evaluation Patient Details Name: Jessica Bowers MRN: [id number] DOB: 1950-08-13 Today's Date: 11/28/2014   History of Present Illness  R TKA  Clinical Impression  Pt admitted with above diagnosis. Pt currently with functional limitations due to the deficits listed below (see PT Problem List).  Pt will benefit from skilled PT to increase their independence and safety with mobility to allow discharge to the venue listed below.    Recommend HHPT, pt has RW and 3in1     Follow Up Recommendations Home health PT    Equipment Recommendations  None recommended by PT    Recommendations for Other Services       Precautions / Restrictions Precautions Required Braces or Orthoses: Knee Immobilizer - Right Knee Immobilizer - Right: Discontinue once straight leg raise with < 10 degree lag Restrictions Other Position/Activity Restrictions: WBAT      Mobility  Bed Mobility Overal bed mobility: Needs Assistance Bed Mobility: Supine to Sit     Supine to sit: Min assist     General bed mobility comments: incr time, cues for technique and assist with RLE  Transfers Overall transfer level: Needs assistance Equipment used: Rolling walker (2 wheeled) Transfers: Sit to/from Stand Sit to Stand: Min assist;+2 safety/equipment         General transfer comment: cues for hand placement  Ambulation/Gait Ambulation/Gait assistance: Min assist Ambulation Distance (Feet): 25 Feet Assistive device: Rolling walker (2 wheeled) Gait Pattern/deviations: Step-to pattern;Wide base of support;Trunk flexed;Antalgic     General Gait Details: cues for sequence, posture, step length and RW position  Stairs            Wheelchair Mobility    Modified Rankin (Stroke Patients Only)       Balance                                             Pertinent Vitals/Pain Pain Assessment: 0-10 Pain Score: 7  Pain Location: R knee Pain Descriptors /  Indicators: Aching;Discomfort;Sore Pain Intervention(s): Limited activity within patient's tolerance;Monitored during session;Premedicated before session;Repositioned;Ice applied    Home Living Family/patient expects to be discharged to:: Private residence Living Arrangements: Spouse/significant other   Type of Home: House Home Access: Stairs to enter   Technical brewer of Steps: 2 Home Layout: Two level Home Equipment: Bedside commode;Walker - 2 wheels;Walker - standard;Shower seat      Prior Function Level of Independence: Independent               Hand Dominance        Extremity/Trunk Assessment   Upper Extremity Assessment: Defer to OT evaluation           Lower Extremity Assessment: RLE deficits/detail         Communication   Communication: No difficulties  Cognition Arousal/Alertness: Awake/alert Behavior During Therapy: WFL for tasks assessed/performed Overall Cognitive Status: Within Functional Limits for tasks assessed                      General Comments      Exercises Total Joint Exercises Ankle Circles/Pumps: AROM;Both;5 reps Quad Sets: 5 reps;Both;Strengthening      Assessment/Plan    PT Assessment Patient needs continued PT services  PT Diagnosis Difficulty walking;Acute pain   PT Problem List Decreased strength;Decreased range of motion;Decreased mobility;Decreased knowledge of use of DME;Pain  PT Treatment Interventions  Functional mobility training;Stair training;Gait training;DME instruction;Therapeutic activities;Patient/family education;Therapeutic exercise   PT Goals (Current goals can be found in the Care Plan section) Acute Rehab PT Goals Patient Stated Goal: return to independence PT Goal Formulation: With patient Time For Goal Achievement: 12/02/14 Potential to Achieve Goals: Good    Frequency 7X/week   Barriers to discharge        Co-evaluation               End of Session Equipment Utilized  During Treatment: Gait belt;Right knee immobilizer Activity Tolerance: Patient tolerated treatment well Patient left: with call bell/phone within reach;in chair;with family/visitor present Nurse Communication: Mobility status         Time: 6144-3154 PT Time Calculation (min) (ACUTE ONLY): 31 min   Charges:   PT Evaluation $Initial PT Evaluation Tier I: 1 Procedure PT Treatments $Gait Training: 8-22 mins   PT G CodesKenyon Bowers December 03, 2014, 5:33 PM

## 2014-11-28 NOTE — Op Note (Signed)
Pre-operative diagnosis- Osteoarthritis  Right knee(s)  Post-operative diagnosis- Osteoarthritis Right knee(s)  Procedure-  Right  Total Knee Arthroplasty  Surgeon- Dione Plover. Adaia Matthies, MD  Assistant- Arlee Muslim, PA-C   Anesthesia-  Spinal  EBL-* No blood loss amount entered *   Drains Hemovac  Tourniquet time- 31 minutes @ 619 mm Hg   Complications- None  Condition-PACU - hemodynamically stable.   Brief Clinical Note  Jessica Bowers is a 64 y.o. year old female with end stage OA of her right knee with progressively worsening pain and dysfunction. She has constant pain, with activity and at rest and significant functional deficits with difficulties even with ADLs. She has had extensive non-op management including analgesics, injections of cortisone and viscosupplements, and home exercise program, but remains in significant pain with significant dysfunction.Radiographs show bone on bone arthritis medial and patellofemoral. She presents now for right Total Knee Arthroplasty.    Procedure in detail---   The patient is brought into the operating room and positioned supine on the operating table. After successful administration of  Spinal,   a tourniquet is placed high on the  Right thigh(s) and the lower extremity is prepped and draped in the usual sterile fashion. Time out is performed by the operating team and then the  Right lower extremity is wrapped in Esmarch, knee flexed and the tourniquet inflated to 300 mmHg.       A midline incision is made with a ten blade through the subcutaneous tissue to the level of the extensor mechanism. A fresh blade is used to make a medial parapatellar arthrotomy. Soft tissue over the proximal medial tibia is subperiosteally elevated to the joint line with a knife and into the semimembranosus bursa with a Cobb elevator. Soft tissue over the proximal lateral tibia is elevated with attention being paid to avoiding the patellar tendon on the tibial tubercle.  The patella is everted, knee flexed 90 degrees and the ACL and PCL are removed. Findings are bone on bone medial and patellofemoral with large global osteophytes.        The drill is used to create a starting hole in the distal femur and the canal is thoroughly irrigated with sterile saline to remove the fatty contents. The 5 degree Right  valgus alignment guide is placed into the femoral canal and the distal femoral cutting block is pinned to remove 10 mm off the distal femur. Resection is made with an oscillating saw.      The tibia is subluxed forward and the menisci are removed. The extramedullary alignment guide is placed referencing proximally at the medial aspect of the tibial tubercle and distally along the second metatarsal axis and tibial crest. The block is pinned to remove 18mm off the more deficient medial  side. Resection is made with an oscillating saw. Size 5is the most appropriate size for the tibia and the proximal tibia is prepared with the modular drill and keel punch for that size.      The femoral sizing guide is placed and size 5 is most appropriate. Rotation is marked off the epicondylar axis and confirmed by creating a rectangular flexion gap at 90 degrees. The size 5 cutting block is pinned in this rotation and the anterior, posterior and chamfer cuts are made with the oscillating saw. The intercondylar block is then placed and that cut is made.      Trial size 5 tibial component, trial size 5 posterior stabilized femur and a 6  mm posterior stabilized  rotating platform insert trial is placed. Full extension is achieved with excellent varus/valgus and anterior/posterior balance throughout full range of motion. The patella is everted and thickness measured to be 22  mm. Free hand resection is taken to 12 mm, a 35 template is placed, lug holes are drilled, trial patella is placed, and it tracks normally. Osteophytes are removed off the posterior femur with the trial in place. All trials  are removed and the cut bone surfaces prepared with pulsatile lavage. Cement is mixed and once ready for implantation, the size 5 tibial implant, size  5 posterior stabilized femoral component, and the size 35 patella are cemented in place and the patella is held with the clamp. The trial insert is placed and the knee held in full extension. The Exparel (20 ml mixed with 30 ml saline) and .25% Bupivicaine, are injected into the extensor mechanism, posterior capsule, medial and lateral gutters and subcutaneous tissues.  All extruded cement is removed and once the cement is hard the permanent 6 mm posterior stabilized rotating platform insert is placed into the tibial tray.      The wound is copiously irrigated with saline solution and the extensor mechanism closed over a hemovac drain with #1 V-loc suture. The tourniquet is released for a total tourniquet time of 31  minutes. Flexion against gravity is 140 degrees and the patella tracks normally. Subcutaneous tissue is closed with 2.0 vicryl and subcuticular with running 4.0 Monocryl. The incision is cleaned and dried and steri-strips and a bulky sterile dressing are applied. The limb is placed into a knee immobilizer and the patient is awakened and transported to recovery in stable condition.      Please note that a surgical assistant was a medical necessity for this procedure in order to perform it in a safe and expeditious manner. Surgical assistant was necessary to retract the ligaments and vital neurovascular structures to prevent injury to them and also necessary for proper positioning of the limb to allow for anatomic placement of the prosthesis.   Dione Plover Finesse Fielder, MD    11/28/2014, 11:28 AM

## 2014-11-29 ENCOUNTER — Encounter (HOSPITAL_COMMUNITY): Payer: Self-pay | Admitting: Orthopedic Surgery

## 2014-11-29 LAB — BASIC METABOLIC PANEL
Anion gap: 7 (ref 5–15)
BUN: 15 mg/dL (ref 6–20)
CO2: 26 mmol/L (ref 22–32)
Calcium: 8.8 mg/dL — ABNORMAL LOW (ref 8.9–10.3)
Chloride: 105 mmol/L (ref 101–111)
Creatinine, Ser: 0.87 mg/dL (ref 0.44–1.00)
GFR calc Af Amer: >60 mL/min (ref >60)
GFR calc non Af Amer: >60 mL/min (ref >60)
Glucose, Bld: 210 mg/dL — ABNORMAL HIGH (ref 65–99)
Potassium: 4.4 mmol/L (ref 3.5–5.1)
Sodium: 138 mmol/L (ref 135–145)

## 2014-11-29 LAB — GLUCOSE, CAPILLARY
Glucose-Capillary: 195 mg/dL — ABNORMAL HIGH (ref 65–99)
Glucose-Capillary: 226 mg/dL — ABNORMAL HIGH (ref 65–99)
Glucose-Capillary: 227 mg/dL — ABNORMAL HIGH (ref 65–99)
Glucose-Capillary: 191 mg/dL — ABNORMAL HIGH (ref 65–99)

## 2014-11-29 LAB — CBC
HCT: 36.6 % (ref 36.0–46.0)
Hemoglobin: 12.3 g/dL (ref 12.0–15.0)
MCH: 30.6 pg (ref 26.0–34.0)
MCHC: 33.6 g/dL (ref 30.0–36.0)
MCV: 91 fL (ref 78.0–100.0)
PLATELETS: 184 10*3/uL (ref 150–400)
RBC: 4.02 MIL/uL (ref 3.87–5.11)
RDW: 12.3 % (ref 11.5–15.5)
WBC: 11.7 10*3/uL — AB (ref 4.0–10.5)

## 2014-11-29 NOTE — Progress Notes (Signed)
Utilization review completed.  

## 2014-11-29 NOTE — Progress Notes (Signed)
   Subjective: 1 Day Post-Op Procedure(s) (LRB): TOTAL RIGHT KNEE ARTHROPLASTY (Right) Patient reports pain as mild and moderate.   Patient seen in rounds with Dr. Wynelle Link. Able to get some rest last night. Patient is having problems with pain in the knee, requiring pain medications but better this morning. We will resume therapy today. She walked 25 feet with therapy the day of surgery. Plan is to go Home after hospital stay.  Objective: Vital signs in last 24 hours: Temp:  [97.5 F (36.4 C)-97.9 F (36.6 C)] 97.9 F (36.6 C) (08/23 0515) Pulse Rate:  [42-67] 58 (08/23 0515) Resp:  [9-16] 14 (08/23 0515) BP: (120-156)/(53-76) 127/55 mmHg (08/23 0515) SpO2:  [95 %-100 %] 97 % (08/23 0515) Weight:  [112.038 kg (247 lb)] 112.038 kg (247 lb) (08/22 1400)  Intake/Output from previous day:  Intake/Output Summary (Last 24 hours) at 11/29/14 0912 Last data filed at 11/29/14 0516  Gross per 24 hour  Intake 2807.5 ml  Output   1585 ml  Net 1222.5 ml    Intake/Output this shift: UOP 550 since around [place]  Labs:  Recent Labs  11/29/14 0458  HGB 12.3    Recent Labs  11/29/14 0458  WBC 11.7*  RBC 4.02  HCT 36.6  PLT 184    Recent Labs  11/29/14 0458  NA 138  K 4.4  CL 105  CO2 26  BUN 15  CREATININE 0.87  GLUCOSE 210*  CALCIUM 8.8*   No results for input(s): LABPT, INR in the last 72 hours.  EXAM General - Patient is Alert, Appropriate and Oriented Extremity - Neurovascular intact Sensation intact distally Dorsiflexion/Plantar flexion intact Dressing - dressing C/D/I Motor Function - intact, moving foot and toes well on exam.  Hemovac pulled without difficulty.  Past Medical History  Diagnosis Date  . Obesity   . Hyperlipidemia   . Diabetes   . HTN (hypertension)   . GERD (gastroesophageal reflux disease)   . CKD (chronic kidney disease)   . Patellar tendinitis   . LEG PAIN, LEFT   . Arthritis   . Varicose veins   . Varicose vein     bilat   .  Bronchitis     hx of   . Shaking     hands  . Shingles     hx of   . History of chicken pox   . Measles     hx of   . Mumps     hx of     Assessment/Plan: 1 Day Post-Op Procedure(s) (LRB): TOTAL RIGHT KNEE ARTHROPLASTY (Right) Principal Problem:   OA (osteoarthritis) of knee  Estimated body mass index is 41.1 kg/(m^2) as calculated from the following:   Height as of this encounter: 5\' 5"  (1.651 m).   Weight as of this encounter: 112.038 kg (247 lb). Advance diet Up with therapy Plan for discharge tomorrow Discharge home with home health  DVT Prophylaxis - Xarelto Weight-Bearing as tolerated to right leg D/C O2 and Pulse OX and try on Room Air  Arlee Muslim, PA-C Orthopaedic Surgery 11/29/2014, 9:12 AM

## 2014-11-29 NOTE — Progress Notes (Signed)
   11/29/14 1700  PT Visit Information  Last PT Received On 11/29/14  Reason Eval/Treat Not Completed Pain limiting ability to participate

## 2014-11-29 NOTE — Progress Notes (Signed)
Patient was given decadron IV today, shortly after developed a flushed face and neck. Benadryl IV was given by Tandy Gaw Nurse for possible allergic reaction to decadron. Patient has developed a flushed face once since then.Allergy placed on her chart.

## 2014-11-29 NOTE — Progress Notes (Signed)
Physical Therapy Treatment Patient Details Name: Jessica Bowers MRN: [id number] DOB: 1950/12/24 Today's Date: 11/29/2014    History of Present Illness R TKA    PT Comments    Pt progressing well; plans to use w/c and temporary ramp to get into house so will not need to do stairs tomorrow  Follow Up Recommendations  Home health PT     Equipment Recommendations  None recommended by PT    Recommendations for Other Services       Precautions / Restrictions Precautions Precautions: Knee Required Braces or Orthoses: Knee Immobilizer - Right Knee Immobilizer - Right: Discontinue once straight leg raise with < 10 degree lag Restrictions Weight Bearing Restrictions: No Other Position/Activity Restrictions: WBAT    Mobility  Bed Mobility               General bed mobility comments: in chair.   Transfers Overall transfer level: Needs assistance Equipment used: Rolling walker (2 wheeled) Transfers: Sit to/from Stand Sit to Stand: Min guard         General transfer comment: cues for hand placement.  Ambulation/Gait Ambulation/Gait assistance: Min guard;Supervision Ambulation Distance (Feet): 75 Feet Assistive device: Rolling walker (2 wheeled) Gait Pattern/deviations: Step-to pattern;Antalgic     General Gait Details: cues for sequence, posture, step length and RW position   Stairs            Wheelchair Mobility    Modified Rankin (Stroke Patients Only)       Balance                                    Cognition Arousal/Alertness: Awake/alert Behavior During Therapy: WFL for tasks assessed/performed Overall Cognitive Status: Within Functional Limits for tasks assessed                      Exercises Total Joint Exercises Ankle Circles/Pumps: AROM;Both;10 reps Quad Sets: AROM;Both;10 reps Heel Slides: AAROM;Right;10 reps Hip ABduction/ADduction: AAROM;Right;10 reps Straight Leg Raises: AAROM;Right;10  reps Goniometric ROM: -10 to 40* AAAROM/PROM    General Comments        Pertinent Vitals/Pain Pain Assessment: 0-10 Pain Score: 6  Pain Location: R knee Pain Descriptors / Indicators: Aching;Sore Pain Intervention(s): Limited activity within patient's tolerance;Monitored during session;Premedicated before session;Repositioned;Ice applied    Home Living Family/patient expects to be discharged to:: Private residence Living Arrangements: Spouse/significant other   Type of Home: House Home Access: Stairs to enter   Home Layout: Two level Home Equipment: Environmental consultant - 2 wheels;Walker - standard;Shower seat Additional Comments: pt states she has bars that slide around commode but she wants a 3in1 also.     Prior Function Level of Independence: Independent          PT Goals (current goals can now be found in the care plan section) Acute Rehab PT Goals Patient Stated Goal: return to independence PT Goal Formulation: With patient Time For Goal Achievement: 12/02/14 Potential to Achieve Goals: Good Progress towards PT goals: Progressing toward goals    Frequency  7X/week    PT Plan Current plan remains appropriate    Co-evaluation             End of Session Equipment Utilized During Treatment: Right knee immobilizer Activity Tolerance: Patient tolerated treatment well Patient left: with call bell/phone within reach;in chair;with family/visitor present     Time: 1016-1050 PT Time Calculation (min) (ACUTE ONLY): 34  min  Charges:  $Gait Training: 8-22 mins $Therapeutic Exercise: 8-22 mins                    G Codes:      Netanel Yannuzzi 12/06/2014, 10:58 AM

## 2014-11-29 NOTE — Discharge Instructions (Addendum)
° °Dr. Frank Aluisio °Total Joint Specialist °[facility] °[address] °[place], [place] [address] °[phone] ° °TOTAL KNEE REPLACEMENT POSTOPERATIVE DIRECTIONS ° °Knee Rehabilitation, Guidelines Following Surgery  °Results after knee surgery are often greatly improved when you follow the exercise, range of motion and muscle strengthening exercises prescribed by your doctor. Safety measures are also important to protect the knee from further injury. Any time any of these exercises cause you to have increased pain or swelling in your knee joint, decrease the amount until you are comfortable again and slowly increase them. If you have problems or questions, call your caregiver or physical therapist for advice.  ° °HOME CARE INSTRUCTIONS  °Remove items at home which could result in a fall. This includes throw rugs or furniture in walking pathways.  °· ICE to the affected knee every three hours for 30 minutes at a time and then as needed for pain and swelling.  Continue to use ice on the knee for pain and swelling from surgery. You may notice swelling that will progress down to the foot and ankle.  This is normal after surgery.  Elevate the leg when you are not up walking on it.   °· Continue to use the breathing machine which will help keep your temperature down.  It is common for your temperature to cycle up and down following surgery, especially at night when you are not up moving around and exerting yourself.  The breathing machine keeps your lungs expanded and your temperature down. °· Do not place pillow under knee, focus on keeping the knee straight while resting ° °DIET °You may resume your previous home diet once your are discharged from the hospital. ° °DRESSING / WOUND CARE / SHOWERING °You may shower 3 days after surgery, but keep the wounds dry during showering.  You may use an occlusive plastic wrap (Press'n Seal for example), NO SOAKING/SUBMERGING IN THE BATHTUB.  If the  bandage gets wet, change with a clean dry gauze.  If the incision gets wet, pat the wound dry with a clean towel. °You may start showering once you are discharged home but do not submerge the incision under water. Just pat the incision dry and apply a dry gauze dressing on daily. °Change the surgical dressing daily and reapply a dry dressing each time. ° °ACTIVITY °Walk with your walker as instructed. °Use walker as long as suggested by your caregivers. °Avoid periods of inactivity such as sitting longer than an hour when not asleep. This helps prevent blood clots.  °You may resume a sexual relationship in one month or when given the OK by your doctor.  °You may return to work once you are cleared by your doctor.  °Do not drive a car for 6 weeks or until released by you surgeon.  °Do not drive while taking narcotics. ° °WEIGHT BEARING °Weight bearing as tolerated with assist device (walker, cane, etc) as directed, use it as long as suggested by your surgeon or therapist, typically at least 4-6 weeks. ° °POSTOPERATIVE CONSTIPATION PROTOCOL °Constipation - defined medically as fewer than three stools per week and severe constipation as less than one stool per week. ° °One of the most common issues patients have following surgery is constipation.  Even if you have a regular bowel pattern at home, your normal regimen is likely to be disrupted due to multiple reasons following surgery.  Combination of anesthesia, postoperative narcotics, change in appetite and fluid intake all can affect your bowels.    In order to avoid complications following surgery, here are some recommendations in order to help you during your recovery period. ° °Colace (docusate) - Pick up an over-the-counter form of Colace or another stool softener and take twice a day as long as you are requiring postoperative pain medications.  Take with a full glass of water daily.  If you experience loose stools or diarrhea, hold the colace until you stool forms  back up.  If your symptoms do not get better within 1 week or if they get worse, check with your doctor. ° °Dulcolax (bisacodyl) - Pick up over-the-counter and take as directed by the product packaging as needed to assist with the movement of your bowels.  Take with a full glass of water.  Use this product as needed if not relieved by Colace only.  ° °MiraLax (polyethylene glycol) - Pick up over-the-counter to have on hand.  MiraLax is a solution that will increase the amount of water in your bowels to assist with bowel movements.  Take as directed and can mix with a glass of water, juice, soda, coffee, or tea.  Take if you go more than two days without a movement. °Do not use MiraLax more than once per day. Call your doctor if you are still constipated or irregular after using this medication for 7 days in a row. ° °If you continue to have problems with postoperative constipation, please contact the office for further assistance and recommendations.  If you experience "the worst abdominal pain ever" or develop nausea or vomiting, please contact the office immediatly for further recommendations for treatment. ° °ITCHING ° If you experience itching with your medications, try taking only a single pain pill, or even half a pain pill at a time.  You can also use Benadryl over the counter for itching or also to help with sleep.  ° °TED HOSE STOCKINGS °Wear the elastic stockings on both legs for three weeks following surgery during the day but you may remove then at night for sleeping. ° °MEDICATIONS °See your medication summary on the “After Visit Summary” that the nursing staff will review with you prior to discharge.  You may have some home medications which will be placed on hold until you complete the course of blood thinner medication.  It is important for you to complete the blood thinner medication as prescribed by your surgeon.  Continue your approved medications as instructed at time of  discharge. ° °PRECAUTIONS °If you experience chest pain or shortness of breath - call 911 immediately for transfer to the hospital emergency department.  °If you develop a fever greater that 101 F, purulent drainage from wound, increased redness or drainage from wound, foul odor from the wound/dressing, or calf pain - CONTACT YOUR SURGEON.   °                                                °FOLLOW-UP APPOINTMENTS °Make sure you keep all of your appointments after your operation with your surgeon and caregivers. You should call the office at the above phone number and make an appointment for approximately two weeks after the date of your surgery or on the date instructed by your surgeon outlined in the "After Visit Summary". ° ° °RANGE OF MOTION AND STRENGTHENING EXERCISES  °Rehabilitation of the knee is important following a knee injury or   an operation. After just a few days of immobilization, the muscles of the thigh which control the knee become weakened and shrink (atrophy). Knee exercises are designed to build up the tone and strength of the thigh muscles and to improve knee motion. Often times heat used for twenty to thirty minutes before working out will loosen up your tissues and help with improving the range of motion but do not use heat for the first two weeks following surgery. These exercises can be done on a training (exercise) mat, on the floor, on a table or on a bed. Use what ever works the best and is most comfortable for you Knee exercises include:  °Leg Lifts - While your knee is still immobilized in a splint or cast, you can do straight leg raises. Lift the leg to 60 degrees, hold for 3 sec, and slowly lower the leg. Repeat 10-20 times 2-3 times daily. Perform this exercise against resistance later as your knee gets better.  °Quad and Hamstring Sets - Tighten up the muscle on the front of the thigh (Quad) and hold for 5-10 sec. Repeat this 10-20 times hourly. Hamstring sets are done by pushing the  foot backward against an object and holding for 5-10 sec. Repeat as with quad sets.  °· Leg Slides: Lying on your back, slowly slide your foot toward your buttocks, bending your knee up off the floor (only go as far as is comfortable). Then slowly slide your foot back down until your leg is flat on the floor again. °· Angel Wings: Lying on your back spread your legs to the side as far apart as you can without causing discomfort.  °A rehabilitation program following serious knee injuries can speed recovery and prevent re-injury in the future due to weakened muscles. Contact your doctor or a physical therapist for more information on knee rehabilitation.  ° °IF YOU ARE TRANSFERRED TO A SKILLED REHAB FACILITY °If the patient is transferred to a skilled rehab facility following release from the hospital, a list of the current medications will be sent to the facility for the patient to continue.  When discharged from the skilled rehab facility, please have the facility set up the patient's Home Health Physical Therapy prior to being released. Also, the skilled facility will be responsible for providing the patient with their medications at time of release from the facility to include their pain medication, the muscle relaxants, and their blood thinner medication. If the patient is still at the rehab facility at time of the two week follow up appointment, the skilled rehab facility will also need to assist the patient in arranging follow up appointment in our office and any transportation needs. ° °MAKE SURE YOU:  °Understand these instructions.  °Get help right away if you are not doing well or get worse.  ° ° °Pick up stool softner and laxative for home use following surgery while on pain medications. °Do not submerge incision under water. °Please use good hand washing techniques while changing dressing each day. °May shower starting three days after surgery. °Please use a clean towel to pat the incision dry following  showers. °Continue to use ice for pain and swelling after surgery. °Do not use any lotions or creams on the incision until instructed by your surgeon. ° °Take Xarelto for two and a half more weeks, then discontinue Xarelto. °Once the patient has completed the Xarelto, they may resume the 81 mg Aspirin. ° ° °Information on my medicine - XARELTO® (Rivaroxaban) ° °  This medication education was reviewed with me or my healthcare representative as part of my discharge preparation.   ° °Why was Xarelto® prescribed for you? °Xarelto® was prescribed for you to reduce the risk of blood clots forming after orthopedic surgery. The medical term for these abnormal blood clots is venous thromboembolism (VTE). ° °What do you need to know about xarelto® ? °Take your Xarelto® ONCE DAILY at the same time every day. °You may take it either with or without food. ° °If you have difficulty swallowing the tablet whole, you may crush it and mix in applesauce just prior to taking your dose. ° °Take Xarelto® exactly as prescribed by your doctor and DO NOT stop taking Xarelto® without talking to the doctor who prescribed the medication.  Stopping without other VTE prevention medication to take the place of Xarelto® may increase your risk of developing a clot. ° °After discharge, you should have regular check-up appointments with your healthcare provider that is prescribing your Xarelto®.   ° °What do you do if you miss a dose? °If you miss a dose, take it as soon as you remember on the same day then continue your regularly scheduled once daily regimen the next day. Do not take two doses of Xarelto® on the same day.  ° °Important Safety Information °A possible side effect of Xarelto® is bleeding. You should call your healthcare provider right away if you experience any of the following: °? Bleeding from an injury or your nose that does not stop. °? Unusual colored urine (red or dark brown) or unusual colored stools (red or black). °? Unusual  bruising for unknown reasons. °? A serious fall or if you hit your head (even if there is no bleeding). ° °Some medicines may interact with Xarelto® and might increase your risk of bleeding while on Xarelto®. To help avoid this, consult your healthcare provider or pharmacist prior to using any new prescription or non-prescription medications, including herbals, vitamins, non-steroidal anti-inflammatory drugs (NSAIDs) and supplements. ° °This website has more information on Xarelto®: www.xarelto.com. ° ° °

## 2014-11-29 NOTE — Evaluation (Signed)
Occupational Therapy Evaluation Patient Details Name: Jessica Bowers MRN: [id number] DOB: 1950-08-29 Today's Date: 11/29/2014    History of Present Illness R TKA   Clinical Impression   Pt doing well but pain from a 5/10 at start of session to 7/10 by end of session. Educated on AE and toilet transfers this visit. Will follow to progress ADL independence for d/c home with family.    Follow Up Recommendations  No OT follow up;Supervision/Assistance - 24 hour    Equipment Recommendations  3 in 1 bedside comode    Recommendations for Other Services       Precautions / Restrictions Precautions Required Braces or Orthoses: Knee Immobilizer - Right Knee Immobilizer - Right: Discontinue once straight leg raise with < 10 degree lag Restrictions Weight Bearing Restrictions: No Other Position/Activity Restrictions: WBAT      Mobility Bed Mobility               General bed mobility comments: in chair.   Transfers Overall transfer level: Needs assistance Equipment used: Rolling walker (2 wheeled) Transfers: Sit to/from Stand Sit to Stand: Min guard         General transfer comment: cues for hand placement.    Balance                                            ADL Overall ADL's : Needs assistance/impaired Eating/Feeding: Independent;Sitting   Grooming: Wash/dry hands;Set up;Sitting   Upper Body Bathing: Set up;Sitting   Lower Body Bathing: Moderate assistance;Sit to/from stand   Upper Body Dressing : Set up;Sitting   Lower Body Dressing: Moderate assistance;Sit to/from stand   Toilet Transfer: Ambulation;RW;BSC;Min guard   Toileting- Clothing Manipulation and Hygiene: Minimal assistance;Sit to/from stand         General ADL Comments: Educated on AE options and pt is considering obtaining AE. Educated on sequence for LB dressing also. Pt has bars that slide around commode but would like a 3in1 to have in her other bathroom.       Vision     Perception     Praxis      Pertinent Vitals/Pain Pain Assessment: 0-10 Pain Score: 7  Pain Location: R knee Pain Descriptors / Indicators: Aching Pain Intervention(s): Repositioned;Ice applied     Hand Dominance     Extremity/Trunk Assessment Upper Extremity Assessment Upper Extremity Assessment: Overall WFL for tasks assessed           Communication Communication Communication: No difficulties   Cognition Arousal/Alertness: Awake/alert Behavior During Therapy: WFL for tasks assessed/performed Overall Cognitive Status: Within Functional Limits for tasks assessed                     General Comments       Exercises       Shoulder Instructions      Home Living Family/patient expects to be discharged to:: Private residence Living Arrangements: Spouse/significant other   Type of Home: House Home Access: Stairs to enter [employer] of Steps: 2   Home Layout: Two level Alternate Level Stairs-Number of Steps: will use chair lift to get upstairs   Bathroom Shower/Tub: Hospital doctor Toilet: Handicapped height     Home Equipment: Environmental consultant - 2 wheels;Walker - standard;Shower seat   Additional Comments: pt states she has bars that slide around commode but she wants a 3in1  also.       Prior Functioning/Environment Level of Independence: Independent             OT Diagnosis: Generalized weakness   OT Problem List: Decreased strength;Decreased knowledge of use of DME or AE   OT Treatment/Interventions: Self-care/ADL training;Patient/family education;Therapeutic activities;DME and/or AE instruction    OT Goals(Current goals can be found in the care plan section) Acute Rehab OT Goals Patient Stated Goal: return to independence OT Goal Formulation: With patient Time For Goal Achievement: 12/06/14 Potential to Achieve Goals: Good  OT Frequency: Min 2X/week   Barriers to D/C:            Co-evaluation               End of Session Equipment Utilized During Treatment: Rolling walker;Right knee immobilizer CPM Right Knee CPM Right Knee: Off  Activity Tolerance: Patient tolerated treatment well Patient left: in chair;with call bell/phone within reach   Time: 0855-0926 OT Time Calculation (min): 31 min Charges:  OT General Charges $OT Visit: 1 Procedure OT Evaluation $Initial OT Evaluation Tier I: 1 Procedure OT Treatments $Therapeutic Activity: 8-22 mins G-Codes:    Jules Schick  [phone] 11/29/2014, 9:34 AM

## 2014-11-30 LAB — BASIC METABOLIC PANEL
Anion gap: 4 — ABNORMAL LOW (ref 5–15)
BUN: 19 mg/dL (ref 6–20)
CHLORIDE: 107 mmol/L (ref 101–111)
CO2: 26 mmol/L (ref 22–32)
Calcium: 8.9 mg/dL (ref 8.9–10.3)
Creatinine, Ser: 0.76 mg/dL (ref 0.44–1.00)
GFR calc Af Amer: >60 mL/min (ref >60)
GFR calc non Af Amer: >60 mL/min (ref >60)
GLUCOSE: 179 mg/dL — AB (ref 65–99)
POTASSIUM: 4.2 mmol/L (ref 3.5–5.1)
Sodium: 137 mmol/L (ref 135–145)

## 2014-11-30 LAB — CBC
HEMATOCRIT: 34.6 % — AB (ref 36.0–46.0)
HEMOGLOBIN: 11.1 g/dL — AB (ref 12.0–15.0)
MCH: 29.3 pg (ref 26.0–34.0)
MCHC: 32.1 g/dL (ref 30.0–36.0)
MCV: 91.3 fL (ref 78.0–100.0)
Platelets: 185 10*3/uL (ref 150–400)
RBC: 3.79 MIL/uL — AB (ref 3.87–5.11)
RDW: 12.7 % (ref 11.5–15.5)
WBC: 15 10*3/uL — ABNORMAL HIGH (ref 4.0–10.5)

## 2014-11-30 LAB — GLUCOSE, CAPILLARY
GLUCOSE-CAPILLARY: 151 mg/dL — AB (ref 65–99)
Glucose-Capillary: 185 mg/dL — ABNORMAL HIGH (ref 65–99)

## 2014-11-30 MED ORDER — TRAMADOL HCL 50 MG PO TABS: 50.0000 mg | ORAL_TABLET | Freq: Four times a day (QID) | ORAL | Status: DC | PRN

## 2014-11-30 MED ORDER — RIVAROXABAN 10 MG PO TABS: 10.0000 mg | ORAL_TABLET | Freq: Every day | ORAL | Status: DC

## 2014-11-30 MED ORDER — METHOCARBAMOL 500 MG PO TABS: 500.0000 mg | ORAL_TABLET | Freq: Four times a day (QID) | ORAL | Status: DC | PRN

## 2014-11-30 MED ORDER — OXYCODONE HCL 5 MG PO TABS: 5.0000 mg | ORAL_TABLET | ORAL | Status: DC | PRN

## 2014-11-30 NOTE — Care Management Note (Signed)
Case Management Note  Patient Details  Name: Jessica Bowers MRN: [id number] Date of Birth: 28-Nov-1950  Subjective/Objective:                   TOTAL RIGHT KNEE ARTHROPLASTY (Right) Action/Plan:  Discharge planning Expected Discharge Date:  11/30/14               Expected Discharge Plan:  [place]  In-House Referral:     Discharge planning Services  CM Consult  Post Acute Care Choice:  Home Health Choice offered to:  Patient  DME Arranged:  3-N-1 DME Agency:  [place]:  PT [facility] Agency:  [place]  Status of Service:  Completed, signed off  Medicare Important Message Given:    Date Medicare IM Given:    Medicare IM give by:    Date Additional Medicare IM Given:    Additional Medicare Important Message give by:     If discussed at [place] of Stay Meetings, dates discussed:    Additional Comments: CM met with pt in room to offer choice of home health agency.  Pt chooses [facility] to render HHPT.  Address and contact information verified by pt.  Referral given to [facility] rep, Tim (on unit).  CM called [facility] DME rep, Lecretia to please deliver the 3n1 to room so pt can discharge.  No other Cm needs were communicated. Dellie Catholic, RN 11/30/2014, 10:11 AM

## 2014-11-30 NOTE — Discharge Summary (Signed)
Physician Discharge Summary   Patient ID: Jessica Bowers MRN: [id number] DOB/AGE: 1951-01-20 64 y.o.  Admit date: 11/28/2014 Discharge date: 11/30/2014  Primary Diagnosis:  Osteoarthritis Right knee(s)  Admission Diagnoses:  Past Medical History  Diagnosis Date  . Obesity   . Hyperlipidemia   . Diabetes   . HTN (hypertension)   . GERD (gastroesophageal reflux disease)   . CKD (chronic kidney disease)   . Patellar tendinitis   . LEG PAIN, LEFT   . Arthritis   . Varicose veins   . Varicose vein     bilat   . Bronchitis     hx of   . Shaking     hands  . Shingles     hx of   . History of chicken pox   . Measles     hx of   . Mumps     hx of    Discharge Diagnoses:   Principal Problem:   OA (osteoarthritis) of knee  Estimated body mass index is 41.1 kg/(m^2) as calculated from the following:   Height as of this encounter: 5' 5"  (1.651 m).   Weight as of this encounter: 112.038 kg (247 lb).  Procedure:  Procedure(s) (LRB): TOTAL RIGHT KNEE ARTHROPLASTY (Right)   Consults: None  HPI: Jessica Bowers is a 64 y.o. year old female with end stage OA of her right knee with progressively worsening pain and dysfunction. She has constant pain, with activity and at rest and significant functional deficits with difficulties even with ADLs. She has had extensive non-op management including analgesics, injections of cortisone and viscosupplements, and home exercise program, but remains in significant pain with significant dysfunction.Radiographs show bone on bone arthritis medial and patellofemoral. She presents now for right Total Knee Arthroplasty.   Laboratory Data: Admission on 11/28/2014  Component Date Value Ref Range Status  . ABO/RH(D) 11/28/2014 A POS   Final  . Antibody Screen 11/28/2014 NEG   Final  . Sample Expiration 11/28/2014 12/01/2014   Final  . Glucose-Capillary 11/28/2014 169* 65 - 99 mg/dL Final  . ABO/RH(D) 11/28/2014 A POS   Final  .  Glucose-Capillary 11/28/2014 140* 65 - 99 mg/dL Final  . Comment 1 11/28/2014 Notify RN   Final  . Comment 2 11/28/2014 Document in Chart   Final  . Glucose-Capillary 11/28/2014 206* 65 - 99 mg/dL Final  . WBC 11/29/2014 11.7* 4.0 - 10.5 K/uL Final  . RBC 11/29/2014 4.02  3.87 - 5.11 MIL/uL Final  . Hemoglobin 11/29/2014 12.3  12.0 - 15.0 g/dL Final  . HCT 11/29/2014 36.6  36.0 - 46.0 % Final  . MCV 11/29/2014 91.0  78.0 - 100.0 fL Final  . MCH 11/29/2014 30.6  26.0 - 34.0 pg Final  . MCHC 11/29/2014 33.6  30.0 - 36.0 g/dL Final  . RDW 11/29/2014 12.3  11.5 - 15.5 % Final  . Platelets 11/29/2014 184  150 - 400 K/uL Final  . Sodium 11/29/2014 138  135 - 145 mmol/L Final  . Potassium 11/29/2014 4.4  3.5 - 5.1 mmol/L Final  . Chloride 11/29/2014 105  101 - 111 mmol/L Final  . CO2 11/29/2014 26  22 - 32 mmol/L Final  . Glucose, Bld 11/29/2014 210* 65 - 99 mg/dL Final  . BUN 11/29/2014 15  6 - 20 mg/dL Final  . Creatinine, Ser 11/29/2014 0.87  0.44 - 1.00 mg/dL Final  . Calcium 11/29/2014 8.8* 8.9 - 10.3 mg/dL Final  . GFR calc non Af Amer 11/29/2014 >  60  >60 mL/min Final  . GFR calc Af Amer 11/29/2014 >60  >60 mL/min Final   Comment: (NOTE) The eGFR has been calculated using the CKD EPI equation. This calculation has not been validated in all clinical situations. eGFR's persistently <60 mL/min signify possible Chronic Kidney Disease.   . Anion gap 11/29/2014 7  5 - 15 Final  . Glucose-Capillary 11/28/2014 242* 65 - 99 mg/dL Final  . Comment 1 11/28/2014 Notify RN   Final  . Glucose-Capillary 11/29/2014 195* 65 - 99 mg/dL Final  . Comment 1 11/29/2014 Notify RN   Final  . Comment 2 11/29/2014 Document in Chart   Final  . Glucose-Capillary 11/29/2014 226* 65 - 99 mg/dL Final  . Comment 1 11/29/2014 Notify RN   Final  . Comment 2 11/29/2014 Document in Chart   Final  . WBC 11/30/2014 15.0* 4.0 - 10.5 K/uL Final  . RBC 11/30/2014 3.79* 3.87 - 5.11 MIL/uL Final  . Hemoglobin 11/30/2014  11.1* 12.0 - 15.0 g/dL Final  . HCT 11/30/2014 34.6* 36.0 - 46.0 % Final  . MCV 11/30/2014 91.3  78.0 - 100.0 fL Final  . MCH 11/30/2014 29.3  26.0 - 34.0 pg Final  . MCHC 11/30/2014 32.1  30.0 - 36.0 g/dL Final  . RDW 11/30/2014 12.7  11.5 - 15.5 % Final  . Platelets 11/30/2014 185  150 - 400 K/uL Final  . Sodium 11/30/2014 137  135 - 145 mmol/L Final  . Potassium 11/30/2014 4.2  3.5 - 5.1 mmol/L Final  . Chloride 11/30/2014 107  101 - 111 mmol/L Final  . CO2 11/30/2014 26  22 - 32 mmol/L Final  . Glucose, Bld 11/30/2014 179* 65 - 99 mg/dL Final  . BUN 11/30/2014 19  6 - 20 mg/dL Final  . Creatinine, Ser 11/30/2014 0.76  0.44 - 1.00 mg/dL Final  . Calcium 11/30/2014 8.9  8.9 - 10.3 mg/dL Final  . GFR calc non Af Amer 11/30/2014 >60  >60 mL/min Final  . GFR calc Af Amer 11/30/2014 >60  >60 mL/min Final   Comment: (NOTE) The eGFR has been calculated using the CKD EPI equation. This calculation has not been validated in all clinical situations. eGFR's persistently <60 mL/min signify possible Chronic Kidney Disease.   . Anion gap 11/30/2014 4* 5 - 15 Final  . Glucose-Capillary 11/29/2014 227* 65 - 99 mg/dL Final  . Glucose-Capillary 11/29/2014 191* 65 - 99 mg/dL Final  . Comment 1 11/29/2014 Notify RN   Final  . Glucose-Capillary 11/30/2014 151* 65 - 99 mg/dL Final  Hospital Outpatient Visit on 11/15/2014  Component Date Value Ref Range Status  . aPTT 11/15/2014 33  24 - 37 seconds Final  . Prothrombin Time 11/15/2014 14.4  11.6 - 15.2 seconds Final  . INR 11/15/2014 1.10  0.00 - 1.49 Final  . Color, Urine 11/15/2014 YELLOW  YELLOW Final  . APPearance 11/15/2014 CLEAR  CLEAR Final  . Specific Gravity, Urine 11/15/2014 1.012  1.005 - 1.030 Final  . pH 11/15/2014 7.0  5.0 - 8.0 Final  . Glucose, UA 11/15/2014 NEGATIVE  NEGATIVE mg/dL Final  . Hgb urine dipstick 11/15/2014 NEGATIVE  NEGATIVE Final  . Bilirubin Urine 11/15/2014 NEGATIVE  NEGATIVE Final  . Ketones, ur 11/15/2014  NEGATIVE  NEGATIVE mg/dL Final  . Protein, ur 11/15/2014 NEGATIVE  NEGATIVE mg/dL Final  . Urobilinogen, UA 11/15/2014 0.2  0.0 - 1.0 mg/dL Final  . Nitrite 11/15/2014 NEGATIVE  NEGATIVE Final  . Leukocytes, UA 11/15/2014 NEGATIVE  NEGATIVE Final   MICROSCOPIC NOT DONE ON URINES WITH NEGATIVE PROTEIN, BLOOD, LEUKOCYTES, NITRITE, OR GLUCOSE <1000 mg/dL.  Marland Kitchen MRSA, PCR 11/15/2014 NEGATIVE  NEGATIVE Final  . Staphylococcus aureus 11/15/2014 NEGATIVE  NEGATIVE Final   Comment:        The Xpert SA Assay (FDA approved for NASAL specimens in patients over 49 years of age), is one component of a comprehensive surveillance program.  Test performance has been validated by [facility] for patients greater than or equal to 26 year old. It is not intended to diagnose infection nor to guide or monitor treatment.   . Sodium 11/15/2014 140  135 - 145 mmol/L Final  . Potassium 11/15/2014 4.3  3.5 - 5.1 mmol/L Final  . Chloride 11/15/2014 104  101 - 111 mmol/L Final  . CO2 11/15/2014 25  22 - 32 mmol/L Final  . Glucose, Bld 11/15/2014 136* 65 - 99 mg/dL Final  . BUN 11/15/2014 16  6 - 20 mg/dL Final  . Creatinine, Ser 11/15/2014 0.81  0.44 - 1.00 mg/dL Final  . Calcium 11/15/2014 9.9  8.9 - 10.3 mg/dL Final  . Total Protein 11/15/2014 7.4  6.5 - 8.1 g/dL Final  . Albumin 11/15/2014 4.2  3.5 - 5.0 g/dL Final  . AST 11/15/2014 33  15 - 41 U/L Final  . ALT 11/15/2014 37  14 - 54 U/L Final  . Alkaline Phosphatase 11/15/2014 90  38 - 126 U/L Final  . Total Bilirubin 11/15/2014 1.1  0.3 - 1.2 mg/dL Final  . GFR calc non Af Amer 11/15/2014 >60  >60 mL/min Final  . GFR calc Af Amer 11/15/2014 >60  >60 mL/min Final   Comment: (NOTE) The eGFR has been calculated using the CKD EPI equation. This calculation has not been validated in all clinical situations. eGFR's persistently <60 mL/min signify possible Chronic Kidney Disease.   . Anion gap 11/15/2014 11  5 - 15 Final     X-Rays:No results  found.  EKG: Orders placed or performed in visit on 08/29/14  . EKG 12-Lead     Hospital Course: NAFEESA DILS is a 64 y.o. who was admitted to [facility]. They were brought to the operating room on 11/28/2014 and underwent Procedure(s): TOTAL RIGHT KNEE ARTHROPLASTY.  Patient tolerated the procedure well and was later transferred to the recovery room and then to the orthopaedic floor for postoperative care.  They were given PO and IV analgesics for pain control following their surgery.  They were given 24 hours of postoperative antibiotics of  Anti-infectives    Start     Dose/Rate Route Frequency Ordered Stop   11/28/14 1630  ceFAZolin (ANCEF) IVPB 2 g/50 mL premix     2 g 100 mL/hr over 30 Minutes Intravenous Every 6 hours 11/28/14 1407 11/28/14 2324   11/28/14 0741  ceFAZolin (ANCEF) IVPB 2 g/50 mL premix     2 g 100 mL/hr over 30 Minutes Intravenous On call to O.R. 11/28/14 4132 11/28/14 1030     and started on DVT prophylaxis in the form of Xarelto.   PT and OT were ordered for total joint protocol.  Discharge planning consulted to help with postop disposition and equipment needs.  Patient had a decent night on the evening of surgery.  They started to get up OOB with therapy on day one. Hemovac drain was pulled without difficulty.  Continued to work with therapy into day two.  Dressing was changed on day two and the incision  was healing well. Patient was seen in rounds and was ready to go home.  Discharge home with home health Diet - Cardiac diet, Diabetic diet and Renal diet Follow up - in 2 weeks Activity - WBAT Disposition - Home Condition Upon Discharge - Good D/C Meds - See DC Summary DVT Prophylaxis - Xarelto  Discharge Instructions    Call MD / Call 911    Complete by:  As directed   If you experience chest pain or shortness of breath, CALL 911 and be transported to the hospital emergency room.  If you develope a fever above 101 F, pus (white drainage) or  increased drainage or redness at the wound, or calf pain, call your surgeon's office.     Change dressing    Complete by:  As directed   Change dressing daily with sterile 4 x 4 inch gauze dressing and apply TED hose. Do not submerge the incision under water.     Constipation Prevention    Complete by:  As directed   Drink plenty of fluids.  Prune juice may be helpful.  You may use a stool softener, such as Colace (over the counter) 100 mg twice a day.  Use MiraLax (over the counter) for constipation as needed.     Diet - low sodium heart healthy    Complete by:  As directed      Diet Carb Modified    Complete by:  As directed      Discharge instructions    Complete by:  As directed   Pick up stool softner and laxative for home use following surgery while on pain medications. Do not submerge incision under water. Please use good hand washing techniques while changing dressing each day. May shower starting three days after surgery. Please use a clean towel to pat the incision dry following showers. Continue to use ice for pain and swelling after surgery. Do not use any lotions or creams on the incision until instructed by your surgeon.  Take Xarelto for two and a half more weeks, then discontinue Xarelto. Once the patient has completed the Xarelto, they may resume the 81 mg Aspirin.  Postoperative Constipation Protocol  Constipation - defined medically as fewer than three stools per week and severe constipation as less than one stool per week.  One of the most common issues patients have following surgery is constipation.  Even if you have a regular bowel pattern at home, your normal regimen is likely to be disrupted due to multiple reasons following surgery.  Combination of anesthesia, postoperative narcotics, change in appetite and fluid intake all can affect your bowels.  In order to avoid complications following surgery, here are some recommendations in order to help you during your  recovery period.  Colace (docusate) - Pick up an over-the-counter form of Colace or another stool softener and take twice a day as long as you are requiring postoperative pain medications.  Take with a full glass of water daily.  If you experience loose stools or diarrhea, hold the colace until you stool forms back up.  If your symptoms do not get better within 1 week or if they get worse, check with your doctor.  Dulcolax (bisacodyl) - Pick up over-the-counter and take as directed by the product packaging as needed to assist with the movement of your bowels.  Take with a full glass of water.  Use this product as needed if not relieved by Colace only.   MiraLax (polyethylene glycol) - Pick  up over-the-counter to have on hand.  MiraLax is a solution that will increase the amount of water in your bowels to assist with bowel movements.  Take as directed and can mix with a glass of water, juice, soda, coffee, or tea.  Take if you go more than two days without a movement. Do not use MiraLax more than once per day. Call your doctor if you are still constipated or irregular after using this medication for 7 days in a row.  If you continue to have problems with postoperative constipation, please contact the office for further assistance and recommendations.  If you experience "the worst abdominal pain ever" or develop nausea or vomiting, please contact the office immediatly for further recommendations for treatment.     Do not put a pillow under the knee. Place it under the heel.    Complete by:  As directed      Do not sit on low chairs, stoools or toilet seats, as it may be difficult to get up from low surfaces    Complete by:  As directed      Driving restrictions    Complete by:  As directed   No driving until released by the physician.     Increase activity slowly as tolerated    Complete by:  As directed      Lifting restrictions    Complete by:  As directed   No lifting until released by the  physician.     Patient may shower    Complete by:  As directed   You may shower without a dressing once there is no drainage.  Do not wash over the wound.  If drainage remains, do not shower until drainage stops.     TED hose    Complete by:  As directed   Use stockings (TED hose) for 3 weeks on both leg(s).  You may remove them at night for sleeping.     Weight bearing as tolerated    Complete by:  As directed   Laterality:  right  Extremity:  Lower            Medication List    STOP taking these medications        aspirin EC 81 MG tablet     cholecalciferol 1000 UNITS tablet  Commonly known as:  VITAMIN D     GNP CINNAMON PO     multivitamin capsule     Omega 3 1200 MG Caps     vitamin B-12 500 MCG tablet  Commonly known as:  CYANOCOBALAMIN      TAKE these medications        atenolol 50 MG tablet  Commonly known as:  TENORMIN  Take 50 mg by mouth every morning.     atorvastatin 10 MG tablet  Commonly known as:  LIPITOR  Take 10 mg by mouth every morning.     losartan 100 MG tablet  Commonly known as:  COZAAR  Take 100 mg by mouth every morning.     methocarbamol 500 MG tablet  Commonly known as:  ROBAXIN  Take 1 tablet (500 mg total) by mouth every 6 (six) hours as needed for muscle spasms.     oxybutynin 10 MG 24 hr tablet  Commonly known as:  DITROPAN-XL  Take 10 mg by mouth every morning.     oxyCODONE 5 MG immediate release tablet  Commonly known as:  Oxy IR/ROXICODONE  Take 1-2 tablets (5-10 mg total) by mouth  every 3 (three) hours as needed for moderate pain, severe pain or breakthrough pain.     polyvinyl alcohol 1.4 % ophthalmic solution  Commonly known as:  LIQUIFILM TEARS  Place 1 drop into both eyes 2 (two) times daily.     rivaroxaban 10 MG Tabs tablet  Commonly known as:  XARELTO  Take 1 tablet (10 mg total) by mouth daily with breakfast. Take Xarelto for two and a half more weeks, then discontinue Xarelto. Once the patient has  completed the Xarelto, they may resume the 81 mg Aspirin.     sitaGLIPtin 25 MG tablet  Commonly known as:  JANUVIA  Take 25 mg by mouth every morning.     traMADol 50 MG tablet  Commonly known as:  ULTRAM  Take 1-2 tablets (50-100 mg total) by mouth every 6 (six) hours as needed (mild pain).           Follow-up Information    Follow up with Gearlean Alf, MD On 12/13/2014.   Specialty:  Orthopedic Surgery   Why:  Call office at [phone] to setup appointment on Tuesday 12/13/2014 with Dr. Denman George information:   [address] [place] [address] [phone]       Signed: Arlee Muslim, PA-C Orthopaedic Surgery 11/30/2014, 8:05 AM

## 2014-11-30 NOTE — Progress Notes (Signed)
   Subjective: 2 Days Post-Op Procedure(s) (LRB): TOTAL RIGHT KNEE ARTHROPLASTY (Right) Patient reports pain as mild.   Patient seen in rounds with Dr. Wynelle Link. Patient is well, but has had some minor complaints of pain in the knee, requiring pain medications Patient is ready to go home  Objective: Vital signs in last 24 hours: Temp:  [97.9 F (36.6 C)-98.6 F (37 C)] 98.6 F (37 C) (08/24 0507) Pulse Rate:  [50-58] 58 (08/24 0507) Resp:  [16] 16 (08/24 0507) BP: (141-145)/(50-62) 142/62 mmHg (08/24 0507) SpO2:  [93 %-100 %] 100 % (08/24 0507)  Intake/Output from previous day:  Intake/Output Summary (Last 24 hours) at 11/30/14 0759 Last data filed at 11/30/14 0725  Gross per 24 hour  Intake 1801.17 ml  Output   2750 ml  Net -948.83 ml    Intake/Output this shift: Total I/O In: -  Out: 700 [Urine:700]  Labs:  Recent Labs  11/29/14 0458 11/30/14 0420  HGB 12.3 11.1*    Recent Labs  11/29/14 0458 11/30/14 0420  WBC 11.7* 15.0*  RBC 4.02 3.79*  HCT 36.6 34.6*  PLT 184 185    Recent Labs  11/29/14 0458 11/30/14 0420  NA 138 137  K 4.4 4.2  CL 105 107  CO2 26 26  BUN 15 19  CREATININE 0.87 0.76  GLUCOSE 210* 179*  CALCIUM 8.8* 8.9   No results for input(s): LABPT, INR in the last 72 hours.  EXAM: General - Patient is Alert, Appropriate and Oriented Extremity - Neurovascular intact Sensation intact distally Dorsiflexion/Plantar flexion intact Incision - clean, dry, no drainage Motor Function - intact, moving foot and toes well on exam.   Assessment/Plan: 2 Days Post-Op Procedure(s) (LRB): TOTAL RIGHT KNEE ARTHROPLASTY (Right) Procedure(s) (LRB): TOTAL RIGHT KNEE ARTHROPLASTY (Right) Past Medical History  Diagnosis Date  . Obesity   . Hyperlipidemia   . Diabetes   . HTN (hypertension)   . GERD (gastroesophageal reflux disease)   . CKD (chronic kidney disease)   . Patellar tendinitis   . LEG PAIN, LEFT   . Arthritis   . Varicose veins    . Varicose vein     bilat   . Bronchitis     hx of   . Shaking     hands  . Shingles     hx of   . History of chicken pox   . Measles     hx of   . Mumps     hx of    Principal Problem:   OA (osteoarthritis) of knee  Estimated body mass index is 41.1 kg/(m^2) as calculated from the following:   Height as of this encounter: 5\' 5"  (1.651 m).   Weight as of this encounter: 112.038 kg (247 lb). Up with therapy Discharge home with home health Diet - Cardiac diet, Diabetic diet and Renal diet Follow up - in 2 weeks Activity - WBAT Disposition - Home Condition Upon Discharge - Good D/C Meds - See DC Summary DVT Prophylaxis - Xarelto  Arlee Muslim, PA-C Orthopaedic Surgery 11/30/2014, 7:59 AM

## 2014-11-30 NOTE — Progress Notes (Signed)
Physical Therapy Treatment Patient Details Name: Jessica Bowers MRN: [id number] DOB: 04-16-1950 Today's Date: 11/30/2014    History of Present Illness R TKA    PT Comments    Progressing with mobility. Discussed ramp use and chair lift use. All education completed. Ready to d/c from PT standpoint.   Follow Up Recommendations  Home health PT     Equipment Recommendations  None recommended by PT    Recommendations for Other Services       Precautions / Restrictions Precautions Precautions: Knee Required Braces or Orthoses: Knee Immobilizer - Right Knee Immobilizer - Right: Discontinue once straight leg raise with < 10 degree lag Restrictions Weight Bearing Restrictions: No Other Position/Activity Restrictions: WBAT    Mobility  Bed Mobility               General bed mobility comments: in chair.   Transfers Overall transfer level: Needs assistance Equipment used: Rolling walker (2 wheeled) Transfers: Sit to/from Stand Sit to Stand: Min assist         General transfer comment: assist to rise and position R LE. VCs safety, technique, hand/LE placement  Ambulation/Gait Ambulation/Gait assistance: Min guard Ambulation Distance (Feet): 60 Feet Assistive device: Rolling walker (2 wheeled) Gait Pattern/deviations: Step-to pattern;Antalgic     General Gait Details: close guard for safety. VCs safety, technique, sequence. slow gait speed.   Stairs            Wheelchair Mobility    Modified Rankin (Stroke Patients Only)       Balance                                    Cognition Arousal/Alertness: Awake/alert Behavior During Therapy: WFL for tasks assessed/performed Overall Cognitive Status: Within Functional Limits for tasks assessed                      Exercises Total Joint Exercises Ankle Circles/Pumps: AROM;Both;10 reps Quad Sets: AROM;Both;10 reps Hip ABduction/ADduction: AAROM;Right;10 reps Straight Leg  Raises: AAROM;Right;10 reps Knee Flexion: AAROM;Right;Seated (7 reps) Goniometric ROM: ~10-55 degrees    General Comments        Pertinent Vitals/Pain Pain Assessment: 0-10 Pain Score: 7  Pain Location: R knee with activity Pain Descriptors / Indicators: Aching;Sore Pain Intervention(s): Monitored during session;Ice applied;Repositioned    Home Living                      Prior Function            PT Goals (current goals can now be found in the care plan section) Progress towards PT goals: Progressing toward goals    Frequency  7X/week    PT Plan Current plan remains appropriate    Co-evaluation             End of Session Equipment Utilized During Treatment: Right knee immobilizer Activity Tolerance: Patient tolerated treatment well Patient left: in chair;with call bell/phone within reach;with family/visitor present     Time: 1121-1150 PT Time Calculation (min) (ACUTE ONLY): 29 min  Charges:  $Gait Training: 8-22 mins $Therapeutic Exercise: 8-22 mins                    G Codes:      Weston Anna, MPT Pager: [phone]

## 2014-11-30 NOTE — Progress Notes (Addendum)
Occupational Therapy Treatment Patient Details Name: Jessica Bowers MRN: [id number] DOB: 05/28/1950 Today's Date: 11/30/2014    History of present illness R TKA   OT comments  Pt with 6/10 at start of session. Pt with 8/10 pain during activity but down to 6/10 again with rest. Nursing made aware of pain and brought pain meds during session. Educated further on toilet transfer safety and walker use. Also discussed shower transfers with stepping over ledge versus using the tub and sitting on chair and pivoting Les around into tub. Recommended pt sponge bathe a few days to gain a little more strength and allow pain to be controlled. Pt verbalized understanding. Pt states family will also be able to assist at d/c.    Follow Up Recommendations  No OT follow up    Equipment Recommendations  3 in 1 bedside comode    Recommendations for Other Services      Precautions / Restrictions Precautions Precautions: Knee Required Braces or Orthoses: Knee Immobilizer - Right Knee Immobilizer - Right: Discontinue once straight leg raise with < 10 degree lag Restrictions Weight Bearing Restrictions: No Other Position/Activity Restrictions: WBAT       Mobility Bed Mobility               General bed mobility comments: in chair.   Transfers Overall transfer level: Needs assistance Equipment used: Rolling walker (2 wheeled) Transfers: Sit to/from Stand Sit to Stand: Min assist         General transfer comment: cues for hand placement and min assist to extend R LE out in front.     Balance                                   ADL                           Toilet Transfer: Minimal assistance;Ambulation;BSC;RW   Toileting- Clothing Manipulation and Hygiene: Minimal assistance;Sit to/from stand         General ADL Comments: Pt with 8/10 pain with movement and due for pain meds so pt called for meds during session and  nursing brought. Pt wanted to go  ahead and get up to 3in1 to void while OT here. Pt needed min assist to help extend R LE out in front prior to sitting and light balance assist. She also required cues for hand placement with transitions. Friend present and helping hands on with care. Pt also needed min assist for balance with pulling up clohting/hygiene. Cautioned pt and friend to make sure someone is right with her when she gets up right now and  also for ADL where she is standing and letting go of walker (ie pulling up pants etc). Discussed at length shower transfers. Pt in too much pain to attempt stepping over a ledge right now. Demonstrated shower transfer technique and issued handout. Pt states she also has a tub with a chair that she used with her mom to help her pivot Les around into tub and she also did this herself when she hurt her ankle. Explained that sitting and pivoting is an option as long as the chair meets the tub edge or she can step over shower ledge using walker to step back as demonstrated. Emphasized that pt should wait until pain is controlled when she is allowed to shower to make transfer easier. Also discussed use  of KI to step in shower and doff once seated. Reviewed sequence for LB dressing and demonstrated all AE. Pt states she may obtain AE kit.       Vision                     Perception     Praxis      Cognition   Behavior During Therapy: WFL for tasks assessed/performed Overall Cognitive Status: Within Functional Limits for tasks assessed                       Extremity/Trunk Assessment               Exercises     Shoulder Instructions       General Comments      Pertinent Vitals/ Pain       Pain Assessment: 0-10 Pain Score: 8  Pain Location: with movement. down to 6 with rest. Pain Descriptors / Indicators: Aching Pain Intervention(s): Patient requesting pain meds-RN notified;Repositioned;Ice applied  Home Living                                           Prior Functioning/Environment              Frequency Min 2X/week     Progress Toward Goals  OT Goals(current goals can now be found in the care plan section)  Progress towards OT goals:  (limited by pain this session)     Plan Discharge plan remains appropriate    Co-evaluation                 End of Session Equipment Utilized During Treatment: Rolling walker;Right knee immobilizer   Activity Tolerance Patient limited by pain   Patient Left in chair;with call bell/phone within reach;with family/visitor present   Nurse Communication          Time: 4128-7867 OT Time Calculation (min): 37 min  Charges: OT General Charges $OT Visit: 1 Procedure OT Treatments $Self Care/Home Management : 8-22 mins $Therapeutic Activity: 8-22 mins  Jules Schick  [phone] 11/30/2014, 11:15 AM

## 2014-12-06 ENCOUNTER — Encounter (INDEPENDENT_AMBULATORY_CARE_PROVIDER_SITE_OTHER): Payer: 59

## 2014-12-06 DIAGNOSIS — I868 Varicose veins of other specified sites: Secondary | ICD-10-CM

## 2014-12-08 ENCOUNTER — Ambulatory Visit (INDEPENDENT_AMBULATORY_CARE_PROVIDER_SITE_OTHER): Payer: 59 | Admitting: Internal Medicine

## 2014-12-08 ENCOUNTER — Observation Stay (HOSPITAL_COMMUNITY)
Admission: AD | Admit: 2014-12-08 | Discharge: 2014-12-09 | Disposition: A | Discharge status: Home / Self Care | Payer: 59 | Source: Ambulatory Visit | Attending: Cardiology | Admitting: Cardiology

## 2014-12-08 ENCOUNTER — Encounter: Payer: Self-pay | Admitting: Cardiology

## 2014-12-08 ENCOUNTER — Telehealth: Payer: Self-pay | Admitting: Cardiology

## 2014-12-08 ENCOUNTER — Encounter (HOSPITAL_COMMUNITY): Payer: Self-pay | Admitting: *Deleted

## 2014-12-08 VITALS — BP 140/73 | HR 73 | Ht 65.0 in | Wt 243.0 lb

## 2014-12-08 DIAGNOSIS — Z79899 Other long term (current) drug therapy: Secondary | ICD-10-CM | POA: Insufficient documentation

## 2014-12-08 DIAGNOSIS — R079 Chest pain, unspecified: Secondary | ICD-10-CM

## 2014-12-08 DIAGNOSIS — I10 Essential (primary) hypertension: Secondary | ICD-10-CM | POA: Insufficient documentation

## 2014-12-08 DIAGNOSIS — E119 Type 2 diabetes mellitus without complications: Secondary | ICD-10-CM | POA: Diagnosis not present

## 2014-12-08 DIAGNOSIS — R0789 Other chest pain: Secondary | ICD-10-CM

## 2014-12-08 LAB — TROPONIN I: Troponin I: <0.03 ng/mL (ref <0.031)

## 2014-12-08 LAB — BASIC METABOLIC PANEL
Anion gap: 9 (ref 5–15)
BUN: 11 mg/dL (ref 6–20)
CO2: 26 mmol/L (ref 22–32)
CREATININE: 0.8 mg/dL (ref 0.44–1.00)
Calcium: 9.3 mg/dL (ref 8.9–10.3)
Chloride: 98 mmol/L — ABNORMAL LOW (ref 101–111)
Glucose, Bld: 144 mg/dL — ABNORMAL HIGH (ref 65–99)
POTASSIUM: 3.6 mmol/L (ref 3.5–5.1)
SODIUM: 133 mmol/L — AB (ref 135–145)

## 2014-12-08 LAB — GLUCOSE, CAPILLARY: Glucose-Capillary: 123 mg/dL — ABNORMAL HIGH (ref 65–99)

## 2014-12-08 LAB — CBC
HEMATOCRIT: 33.8 % — AB (ref 36.0–46.0)
Hemoglobin: 11.2 g/dL — ABNORMAL LOW (ref 12.0–15.0)
MCH: 30.4 pg (ref 26.0–34.0)
MCHC: 33.1 g/dL (ref 30.0–36.0)
MCV: 91.6 fL (ref 78.0–100.0)
PLATELETS: 308 10*3/uL (ref 150–400)
RBC: 3.69 MIL/uL — AB (ref 3.87–5.11)
RDW: 13.1 % (ref 11.5–15.5)
WBC: 9.4 10*3/uL (ref 4.0–10.5)

## 2014-12-08 MED ORDER — RIVAROXABAN 10 MG PO TABS
10.0000 mg | ORAL_TABLET | Freq: Every day | ORAL | Status: DC
  Administered 2014-12-09: 10 mg via ORAL
  Filled 2014-12-08 (×2): qty 1

## 2014-12-08 MED ORDER — INSULIN ASPART 100 UNIT/ML ~~LOC~~ SOLN
0.0000 [IU] | Freq: Three times a day (TID) | SUBCUTANEOUS | Status: DC
  Administered 2014-12-09 (×2): 1 [IU] via SUBCUTANEOUS

## 2014-12-08 MED ORDER — ATORVASTATIN CALCIUM 10 MG PO TABS
10.0000 mg | ORAL_TABLET | Freq: Every morning | ORAL | Status: DC
  Administered 2014-12-09: 10 mg via ORAL
  Filled 2014-12-08: qty 1

## 2014-12-08 MED ORDER — ATENOLOL 25 MG PO TABS
50.0000 mg | ORAL_TABLET | Freq: Every morning | ORAL | Status: DC
  Administered 2014-12-09: 50 mg via ORAL
  Filled 2014-12-08: qty 2

## 2014-12-08 MED ORDER — LINAGLIPTIN 5 MG PO TABS
5.0000 mg | ORAL_TABLET | Freq: Every day | ORAL | Status: DC
  Filled 2014-12-08: qty 1

## 2014-12-08 MED ORDER — LOSARTAN POTASSIUM 50 MG PO TABS
100.0000 mg | ORAL_TABLET | Freq: Every day | ORAL | Status: DC
  Administered 2014-12-09: 100 mg via ORAL
  Filled 2014-12-08: qty 2

## 2014-12-08 MED ORDER — OXYCODONE HCL 5 MG PO TABS
5.0000 mg | ORAL_TABLET | ORAL | Status: DC | PRN
  Administered 2014-12-08: 5 mg via ORAL
  Administered 2014-12-08 (×2): 10 mg via ORAL
  Administered 2014-12-09: 5 mg via ORAL
  Administered 2014-12-09: 10 mg via ORAL
  Administered 2014-12-09 (×2): 5 mg via ORAL
  Filled 2014-12-08: qty 2
  Filled 2014-12-08: qty 1
  Filled 2014-12-08 (×2): qty 2
  Filled 2014-12-08 (×2): qty 1
  Filled 2014-12-08: qty 2

## 2014-12-08 MED ORDER — INSULIN ASPART 100 UNIT/ML ~~LOC~~ SOLN: 0.0000 [IU] | Freq: Every day | SUBCUTANEOUS | Status: DC

## 2014-12-08 MED ORDER — METHOCARBAMOL 500 MG PO TABS
500.0000 mg | ORAL_TABLET | Freq: Four times a day (QID) | ORAL | Status: DC | PRN
  Administered 2014-12-08: 500 mg via ORAL
  Filled 2014-12-08: qty 1

## 2014-12-08 MED ORDER — LOSARTAN POTASSIUM 50 MG PO TABS: 100.0000 mg | ORAL_TABLET | Freq: Every morning | ORAL | Status: DC

## 2014-12-08 NOTE — Telephone Encounter (Signed)
Reviewed the patient's symptoms with Ellen Henri, PA- ok to bring patient in for an office visit today for evaluation. The patient is agreeable and will come at 1:30 pm today.

## 2014-12-08 NOTE — Telephone Encounter (Signed)
Pt c/o of Chest Pain twice yesterday (first in AM- relieved w/ TUMS): STAT if CP now or developed within 24 hours  1. Are you having CP right now? no  2. Are you experiencing any other symptoms (ex. SOB, nausea, vomiting, sweating)? Some sweating/nausea last night (~5pm)   3. How long have you been experiencing CP? Yesterday afternoon (8/31)  4. Is your CP continuous or coming and going? Come/go- 2 waves of chest pain yesterday (8/31)   5. Have you taken Nitroglycerin? No  ?

## 2014-12-08 NOTE — Telephone Encounter (Signed)
I spoke with the patient. She reports that she had a right knee replacement on 11/28/14. She is home and [facility] is providing home health to her. She reports that the nurse left her house about 1:30 pm yesterday and just after that, she started having chest discomfort and took some TUMS. Symptoms resolved after about 45 minutes. Her husband brought her dinner from [facility] about 5/5:30 pm last night- 1/2 salad/ sandwich. She reports that she did not get very far along in her meal and her chest discomfort started back again.  She reports that her husband told her she looked ashy/ gray/ and she was nauseated. She started to feel pre-syncopal. She took more TUMS and she felt some better about 45 minutes later. Her BP was 122/52. She has been symptom free since about 6:30/ 7:00 pm last night. She does recall that when she was in recovery from her surgery that she was bradycardic and had a low BP. She has a remote history of acid reflux, which she had before she lost 40 lbs. She denies leg pain, as she called [facility] last night and they asked her this question. I advised her I would review with the provider in the office and call her back with recommendations, she is agreeable.

## 2014-12-08 NOTE — Patient Instructions (Signed)
Medication Instructions:  Your physician recommends that you continue on your current medications as directed. Please refer to the Current Medication list given to you today.   Labwork: nONE  Testing/Procedures: NONE  Follow-Up:   Any Other Special Instructions Will Be Listed Below (If Applicable).

## 2014-12-08 NOTE — H&P (Signed)
12/08/2014 Jessica Bowers  1950/11/15  [id number]  Primary Physician Tawanna Solo, MD Primary Cardiologist: Marlou Porch   Reason for Visit/CC: Chest pain  HPI: The patient is a 64 year old, moderately obese female, followed by Dr. Marlou Porch, with a history of insulin dependent diabetes and hypertension. 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 In September 2014, she underwent a nuclear stress test for evaluation of chest pain. This demonstrated a mild area of reversible ischemia in the apex/distal anterior septal wall. Ejection fraction was normal. It was felt her defect was likely due to breast attenuation. Overall, her stress test was felt to be a low risk and medical management was recommended. She has been maintained on beta blocker, ARB and statin therapy. She was seen by Dr. Marlou Porch 08/2014 for pre-surgical evaluation and was cleared for knee surgery.   She recently underwent orthopedic surgery by Dr. Wynelle Link. She is day 11 status post right total knee arthroplasty. She has been on daily Xarelto for DVT/PE prophylaxis and reports full compliance with this. She had been recovering well from her surgery until yesterday when she developed resting chest discomfort. Symptoms started approximately 30 minutes after completing a home physical therapy session. She noted severe substernal chest pain radiating to her back between her shoulder blades. The pain was sharp in nature but not pleuritic. She took TUMS at home with mild relief. It lasted roughly 30 minutes to one hour. She denied any symptoms of dyspnea, lightheadedness or syncope/near-syncope at that time. Less than one hour later, she developed recurrent chest pain while attempting to eat a salad. This time she noted associated nausea, malaise and profuse diaphoresis. Symptoms resolved roughly 35-40 minutes after taking additional TUMS.  She has had no recurrent chest pain today however given her symptoms last night, she decided to come in for  evaluation. she is currently chest pain-free. She continues to deny any dyspnea. Her EKG shows normal sinus rhythm and is unremarkable.     Current Outpatient Prescriptions  Medication Sig Dispense Refill  . atenolol (TENORMIN) 50 MG tablet Take 50 mg by mouth every morning.     Marland Kitchen atorvastatin (LIPITOR) 10 MG tablet Take 10 mg by mouth every morning.     Marland Kitchen losartan (COZAAR) 100 MG tablet Take 100 mg by mouth every morning.     . methocarbamol (ROBAXIN) 500 MG tablet Take 1 tablet (500 mg total) by mouth every 6 (six) hours as needed for muscle spasms. 80 tablet 0  . oxybutynin (DITROPAN-XL) 10 MG 24 hr tablet Take 10 mg by mouth every morning.     Marland Kitchen oxyCODONE (OXY IR/ROXICODONE) 5 MG immediate release tablet Take 1-2 tablets (5-10 mg total) by mouth every 3 (three) hours as needed for moderate pain, severe pain or breakthrough pain. 80 tablet 0  . polyvinyl alcohol (LIQUIFILM TEARS) 1.4 % ophthalmic solution Place 1 drop into both eyes 2 (two) times daily.    . rivaroxaban (XARELTO) 10 MG TABS tablet Take 1 tablet (10 mg total) by mouth daily with breakfast. Take Xarelto for two and a half more weeks, then discontinue Xarelto. Once the patient has completed the Xarelto, they may resume the 81 mg Aspirin. 19 tablet 0  . sitaGLIPtin (JANUVIA) 25 MG tablet Take 25 mg by mouth every morning.     . traMADol (ULTRAM) 50 MG tablet Take 1-2 tablets (50-100 mg total) by mouth every 6 (six) hours as needed (mild pain). 60 tablet 1   No current facility-administered medications for this visit.  Allergies  Allergen Reactions  . Ace Inhibitors Cough  . Crestor [Rosuvastatin] Other (See Comments)    Leg cramps  . Ezetimibe     zetia  . Glucophage [Metformin] Other (See Comments)    headaches  . Metformin And Related   . Nsaids Nausea And Vomiting  . Pioglitazone Other (See Comments)    Weight gain.    Michail Sermon [Enalaprilat] Other (See Comments)    Cough.   . Decadron [Dexamethasone] Rash    Rash, Hot flashes, nausea    Social History   Social History  . Marital Status: Married    Spouse Name: N/A  . Number of Children: N/A  . Years of Education: N/A   Occupational History  . Not on file.   Social History Main Topics  . Smoking status: Never Smoker   . Smokeless tobacco: Never Used  . Alcohol Use: 0.0 oz/week    0 Standard drinks or equivalent per week     Comment: 2 glasses of wine weekly   . Drug Use: No  . Sexual Activity: Not on file   Other Topics Concern  . Not on file   Social History Narrative     Review of Systems: General: negative for chills, fever, night sweats or weight changes.  Cardiovascular: negative for chest pain, dyspnea on exertion, edema, orthopnea, palpitations, paroxysmal nocturnal dyspnea or shortness of breath Dermatological: negative for rash Respiratory: negative for cough or wheezing Urologic: negative for hematuria Abdominal: negative for nausea, vomiting, diarrhea, bright red blood per rectum, melena, or hematemesis Neurologic: negative for visual changes, syncope, or dizziness All other systems reviewed and are otherwise negative except as noted above.    Blood pressure 140/73, pulse 73, height 5\' 5"  (1.651 m), weight 243 lb (110.224 kg).  General appearance: alert, cooperative and no distress Neck: no carotid bruit and no JVD Lungs: clear to auscultation bilaterally Heart: regular rate and rhythm, S1, S2 normal, no murmur, click, rub or gallop Extremities: + right knee edema, + bilateral compression stockings Pulses: 2+ and symmetric Skin: warm and dry Neurologic: Grossly normal  EKG NSR. Slight ST elevation in lead I compared to prior but no reciprocal ST abnormalites  ASSESSMENT AND PLAN:   1. Right Knee OA: Day 11 s/p TKA. Continue Xarelto for  PE/DVT prophylaxis.   2. Chest Pain: PE seems less likely given the fact she has been fully compliant with Xarelto. Her EKG is unremarkable however her story is concerning for possible cardiac etiology. She does have multiple risk factors including obesity, hypertension, hyperlipidemia and insulin-dependent diabetes. The patient was also seen and examined by Dr. Caryl Comes who has recommended observation at [facility] to rule out acute coronary syndrome. We will admit to telemetry and will cycle cardiac enzymes 3.  3. HTN: BP controlled. Continue atenolol and losartan.  4. HLD: Continue Lipitor.  5. Diabetes: Continue home insulin regimen.  PLAN Admit to obs for rule-out.   SIMMONS, BRITTAINY PA-C 12/08/2014 2:23 PM  Pt with history concerning for angina with prolonged chest discomfort and some diaphoresis. It may well not be cardiac; however, given her diabetes I think rule out is appropriate. We will admit her to hospital for observation with serial troponins. A decision in the morning regarding Myoview scanning would be appropriate if her enzymes are negative. Her ECG is unremarkable  As noted above the fact that she has been on Rivaroxaban makes DVT/PE much less likely

## 2014-12-08 NOTE — Progress Notes (Signed)
12/08/2014 Jessica Bowers   Oct 28, 1950  [id number]  Primary Physician Tawanna Solo, MD Primary Cardiologist: Marlou Porch   Reason for Visit/CC: Chest pain  HPI:  The patient is a 64 year old, moderately obese female, followed by Dr. Marlou Porch, with a history of insulin dependent diabetes and hypertension. 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 In September 2014, she underwent a nuclear stress test for evaluation of chest pain. This demonstrated a mild area of reversible ischemia in the apex/distal anterior septal wall. Ejection fraction was normal. It was felt her defect was likely due to breast attenuation. Overall, her stress test was felt to be a low risk and medical management was recommended. She has been maintained on beta blocker, ARB and statin therapy. She was seen by Dr. Marlou Porch 08/2014 for pre-surgical evaluation and was cleared for knee surgery.   She recently underwent orthopedic surgery by Dr. Wynelle Link. She is day 11 status post right total knee arthroplasty. She has been on daily Xarelto for DVT/PE prophylaxis and reports full compliance with this. She had been recovering well from her surgery until yesterday when she developed resting chest discomfort. Symptoms started approximately 30 minutes after completing a home physical therapy session. She noted severe substernal chest pain radiating to her back between her shoulder blades. The pain was sharp in nature but not pleuritic. She took TUMS at home with mild relief. It lasted roughly 30 minutes to one hour. She denied any symptoms of dyspnea, lightheadedness or syncope/near-syncope at that time. Less than one hour later, she developed recurrent chest pain while attempting to eat a salad. This time she noted associated nausea, malaise and profuse diaphoresis. Symptoms resolved roughly 35-40 minutes after taking additional TUMS.  She has had no recurrent chest pain today however given her symptoms last night, she decided to come in for  evaluation. she is currently chest pain-free. She continues to deny any dyspnea. Her EKG shows normal sinus rhythm and is unremarkable.     Current Outpatient Prescriptions  Medication Sig Dispense Refill  . atenolol (TENORMIN) 50 MG tablet Take 50 mg by mouth every morning.     Marland Kitchen atorvastatin (LIPITOR) 10 MG tablet Take 10 mg by mouth every morning.     Marland Kitchen losartan (COZAAR) 100 MG tablet Take 100 mg by mouth every morning.     . methocarbamol (ROBAXIN) 500 MG tablet Take 1 tablet (500 mg total) by mouth every 6 (six) hours as needed for muscle spasms. 80 tablet 0  . oxybutynin (DITROPAN-XL) 10 MG 24 hr tablet Take 10 mg by mouth every morning.     Marland Kitchen oxyCODONE (OXY IR/ROXICODONE) 5 MG immediate release tablet Take 1-2 tablets (5-10 mg total) by mouth every 3 (three) hours as needed for moderate pain, severe pain or breakthrough pain. 80 tablet 0  . polyvinyl alcohol (LIQUIFILM TEARS) 1.4 % ophthalmic solution Place 1 drop into both eyes 2 (two) times daily.    . rivaroxaban (XARELTO) 10 MG TABS tablet Take 1 tablet (10 mg total) by mouth daily with breakfast. Take Xarelto for two and a half more weeks, then discontinue Xarelto. Once the patient has completed the Xarelto, they may resume the 81 mg Aspirin. 19 tablet 0  . sitaGLIPtin (JANUVIA) 25 MG tablet Take 25 mg by mouth every morning.     . traMADol (ULTRAM) 50 MG tablet Take 1-2 tablets (50-100 mg total) by mouth every 6 (six) hours as needed (mild pain). 60 tablet 1   No current facility-administered medications for this  visit.    Allergies  Allergen Reactions  . Ace Inhibitors Cough  . Crestor [Rosuvastatin] Other (See Comments)    Leg cramps  . Ezetimibe     zetia  . Glucophage [Metformin] Other (See Comments)    headaches  . Metformin And Related   . Nsaids Nausea And Vomiting  . Pioglitazone Other (See Comments)    Weight gain.   Michail Sermon [Enalaprilat] Other (See Comments)    Cough.   . Decadron [Dexamethasone] Rash     Rash, Hot flashes, nausea    Social History   Social History  . Marital Status: Married    Spouse Name: N/A  . Number of Children: N/A  . Years of Education: N/A   Occupational History  . Not on file.   Social History Main Topics  . Smoking status: Never Smoker   . Smokeless tobacco: Never Used  . Alcohol Use: 0.0 oz/week    0 Standard drinks or equivalent per week     Comment: 2 glasses of wine weekly   . Drug Use: No  . Sexual Activity: Not on file   Other Topics Concern  . Not on file   Social History Narrative     Review of Systems: General: negative for chills, fever, night sweats or weight changes.  Cardiovascular: negative for chest pain, dyspnea on exertion, edema, orthopnea, palpitations, paroxysmal nocturnal dyspnea or shortness of breath Dermatological: negative for rash Respiratory: negative for cough or wheezing Urologic: negative for hematuria Abdominal: negative for nausea, vomiting, diarrhea, bright red blood per rectum, melena, or hematemesis Neurologic: negative for visual changes, syncope, or dizziness All other systems reviewed and are otherwise negative except as noted above.    Blood pressure 140/73, pulse 73, height 5\' 5"  (1.651 m), weight 243 lb (110.224 kg).  General appearance: alert, cooperative and no distress Neck: no carotid bruit and no JVD Lungs: clear to auscultation bilaterally Heart: regular rate and rhythm, S1, S2 normal, no murmur, click, rub or gallop Extremities: + right knee edema, + bilateral compression stockings Pulses: 2+ and symmetric Skin: warm and dry Neurologic: Grossly normal  EKG NSR. Slight ST elevation in lead I compared to prior but no reciprocal ST abnormalites  ASSESSMENT AND PLAN:   1. Right Knee OA: Day 11 s/p TKA. Continue Xarelto for PE/DVT prophylaxis.   2.  Chest Pain: PE seems less likely given the fact she has been fully compliant with Xarelto. Her EKG is unremarkable however her story is  concerning for possible cardiac etiology. She does have multiple risk factors including obesity, hypertension, hyperlipidemia and insulin-dependent diabetes. The patient was also seen and examined by Dr. Caryl Comes who has recommended observation at [facility] to rule out acute coronary syndrome. We will admit to telemetry and will cycle cardiac enzymes 3.  3. HTN: BP controlled. Continue atenolol and losartan.  4. HLD: Continue Lipitor.  5. Diabetes: Continue home insulin regimen.  PLAN  Admit to obs for rule-out.   Lyda Jester PA-C 12/08/2014 2:23 PM

## 2014-12-09 ENCOUNTER — Telehealth: Payer: Self-pay | Admitting: Cardiology

## 2014-12-09 ENCOUNTER — Other Ambulatory Visit: Payer: Self-pay | Admitting: Physician Assistant

## 2014-12-09 DIAGNOSIS — R072 Precordial pain: Secondary | ICD-10-CM | POA: Diagnosis not present

## 2014-12-09 DIAGNOSIS — R0789 Other chest pain: Secondary | ICD-10-CM

## 2014-12-09 DIAGNOSIS — R079 Chest pain, unspecified: Secondary | ICD-10-CM | POA: Diagnosis not present

## 2014-12-09 LAB — CBC
HCT: 30.7 % — ABNORMAL LOW (ref 36.0–46.0)
Hemoglobin: 10.1 g/dL — ABNORMAL LOW (ref 12.0–15.0)
MCH: 30.2 pg (ref 26.0–34.0)
MCHC: 32.9 g/dL (ref 30.0–36.0)
MCV: 91.9 fL (ref 78.0–100.0)
PLATELETS: 288 10*3/uL (ref 150–400)
RBC: 3.34 MIL/uL — AB (ref 3.87–5.11)
RDW: 13.4 % (ref 11.5–15.5)
WBC: 9.2 10*3/uL (ref 4.0–10.5)

## 2014-12-09 LAB — BASIC METABOLIC PANEL WITH GFR
Anion gap: 7 (ref 5–15)
BUN: 12 mg/dL (ref 6–20)
CO2: 25 mmol/L (ref 22–32)
Calcium: 8.9 mg/dL (ref 8.9–10.3)
Chloride: 104 mmol/L (ref 101–111)
Creatinine, Ser: 0.76 mg/dL (ref 0.44–1.00)
GFR calc Af Amer: >60 mL/min
GFR calc non Af Amer: >60 mL/min
Glucose, Bld: 142 mg/dL — ABNORMAL HIGH (ref 65–99)
Potassium: 3.8 mmol/L (ref 3.5–5.1)
Sodium: 136 mmol/L (ref 135–145)

## 2014-12-09 LAB — GLUCOSE, CAPILLARY
Glucose-Capillary: 135 mg/dL — ABNORMAL HIGH (ref 65–99)
Glucose-Capillary: 140 mg/dL — ABNORMAL HIGH (ref 65–99)

## 2014-12-09 LAB — TROPONIN I: Troponin I: <0.03 ng/mL

## 2014-12-09 MED ORDER — DOCUSATE SODIUM 100 MG PO CAPS: 100.0000 mg | ORAL_CAPSULE | Freq: Every day | ORAL | Status: DC | PRN

## 2014-12-09 NOTE — Progress Notes (Signed)
SUBJECTIVE:  No further pain.  No SOB.     PHYSICAL EXAM Filed Vitals:   12/08/14 1700 12/08/14 2011 12/09/14 0436 12/09/14 0739  BP: 140/73 143/67 140/68 130/65  Pulse: 77 73 86 75  Temp: 98 F (36.7 C) 97.8 F (36.6 C) 98.6 F (37 C) 98 F (36.7 C)  TempSrc: Oral Oral Oral Oral  Resp: 23 20 18 18   Height:      Weight:      SpO2: 97% 100% 100% 95%   General:  No distress Lungs:  Clear Heart:  RRR Abdomen:  Positive bowel sounds, no rebound no guarding Extremities:  No edema, right knee surgery.  LABS: Lab Results  Component Value Date   TROPONINI <0.03 12/09/2014   Results for orders placed or performed during the hospital encounter of 12/08/14 (from the past 24 hour(s))  Basic metabolic panel     Status: Abnormal   Collection Time: 12/08/14  4:23 PM  Result Value Ref Range   Sodium 133 (L) 135 - 145 mmol/L   Potassium 3.6 3.5 - 5.1 mmol/L   Chloride 98 (L) 101 - 111 mmol/L   CO2 26 22 - 32 mmol/L   Glucose, Bld 144 (H) 65 - 99 mg/dL   BUN 11 6 - 20 mg/dL   Creatinine, Ser 0.80 0.44 - 1.00 mg/dL   Calcium 9.3 8.9 - 10.3 mg/dL   GFR calc non Af Amer >60 >60 mL/min   GFR calc Af Amer >60 >60 mL/min   Anion gap 9 5 - 15  CBC     Status: Abnormal   Collection Time: 12/08/14  4:23 PM  Result Value Ref Range   WBC 9.4 4.0 - 10.5 K/uL   RBC 3.69 (L) 3.87 - 5.11 MIL/uL   Hemoglobin 11.2 (L) 12.0 - 15.0 g/dL   HCT 33.8 (L) 36.0 - 46.0 %   MCV 91.6 78.0 - 100.0 fL   MCH 30.4 26.0 - 34.0 pg   MCHC 33.1 30.0 - 36.0 g/dL   RDW 13.1 11.5 - 15.5 %   Platelets 308 150 - 400 K/uL  Troponin I     Status: None   Collection Time: 12/08/14  4:23 PM  Result Value Ref Range   Troponin I <0.03 <0.031 ng/mL  Troponin I     Status: None   Collection Time: 12/08/14  9:50 PM  Result Value Ref Range   Troponin I <0.03 <0.031 ng/mL  Glucose, capillary     Status: Abnormal   Collection Time: 12/08/14 11:40 PM  Result Value Ref Range   Glucose-Capillary 123 (H) 65 - 99  mg/dL  Troponin I     Status: None   Collection Time: 12/09/14  3:50 AM  Result Value Ref Range   Troponin I <0.03 <0.031 ng/mL  Basic metabolic panel     Status: Abnormal   Collection Time: 12/09/14  3:50 AM  Result Value Ref Range   Sodium 136 135 - 145 mmol/L   Potassium 3.8 3.5 - 5.1 mmol/L   Chloride 104 101 - 111 mmol/L   CO2 25 22 - 32 mmol/L   Glucose, Bld 142 (H) 65 - 99 mg/dL   BUN 12 6 - 20 mg/dL   Creatinine, Ser 0.76 0.44 - 1.00 mg/dL   Calcium 8.9 8.9 - 10.3 mg/dL   GFR calc non Af Amer >60 >60 mL/min   GFR calc Af Amer >60 >60 mL/min   Anion gap 7 5 - 15  CBC  Status: Abnormal   Collection Time: 12/09/14  3:50 AM  Result Value Ref Range   WBC 9.2 4.0 - 10.5 K/uL   RBC 3.34 (L) 3.87 - 5.11 MIL/uL   Hemoglobin 10.1 (L) 12.0 - 15.0 g/dL   HCT 30.7 (L) 36.0 - 46.0 %   MCV 91.9 78.0 - 100.0 fL   MCH 30.2 26.0 - 34.0 pg   MCHC 32.9 30.0 - 36.0 g/dL   RDW 13.4 11.5 - 15.5 %   Platelets 288 150 - 400 K/uL  Glucose, capillary     Status: Abnormal   Collection Time: 12/09/14  7:38 AM  Result Value Ref Range   Glucose-Capillary 135 (H) 65 - 99 mg/dL    Intake/Output Summary (Last 24 hours) at 12/09/14 1048 Last data filed at 12/09/14 0742  Gross per 24 hour  Intake      0 ml  Output      0 ml  Net      0 ml     ASSESSMENT AND PLAN: Chest pain:  Atypical with a heart score of 2.  Plan out patient stress perfusion study or dobutamine echocardiogram.     Minus Breeding 12/09/2014 10:48 AM

## 2014-12-09 NOTE — Progress Notes (Signed)
Pt discharged to home, condition stable, follow up appointments and discharge instructions explained verbally and with printed handout, pt and spouse verbalized understanding, left facility via [place] accompanied by spouse. Edward Qualia RN

## 2014-12-09 NOTE — Discharge Summary (Signed)
Discharge Summary   Patient ID: Jessica Bowers,  MRN: [id number], DOB/AGE: 62-Jul-1952 62 y.o.  Admit date: 12/08/2014 Discharge date: 12/09/2014  Primary Care Provider: Tawanna Solo Primary Cardiologist: Dr. Marlou Porch  Discharge Diagnoses Active Problems:   Chest pain   Allergies Allergies  Allergen Reactions  . Ace Inhibitors Cough  . Crestor [Rosuvastatin] Other (See Comments)    Leg cramps  . Ezetimibe     zetia  . Glucophage [Metformin] Other (See Comments)    headaches  . Metformin And Related   . Nsaids Nausea And Vomiting  . Pioglitazone Other (See Comments)    Weight gain.   Michail Sermon [Enalaprilat] Other (See Comments)    Cough.   . Decadron [Dexamethasone] Rash    Rash, Hot flashes, nausea    Procedures: None  History of Present Illness The patient is a 64 year old, moderately obese female, followed by Dr. Marlou Porch, with a history of insulin dependent diabetes and hypertension. 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 In September 2014, she underwent a nuclear stress test for evaluation of chest pain. This demonstrated a mild area of reversible ischemia in the apex/distal anterior septal wall. Ejection fraction was normal. It was felt her defect was likely due to breast attenuation. Overall, her stress test was felt to be a low risk and medical management was recommended. She has been maintained on beta blocker, ARB and statin therapy. She was seen by Dr. Marlou Porch 08/2014 for pre-surgical evaluation and was cleared for knee surgery.   She recently underwent orthopedic surgery (11/17/14) by Dr. Wynelle Link s/p right total knee arthroplasty. She has been on daily Xarelto for DVT/PE prophylaxis and reports full compliance with this. She had been recovering well from her surgery until 12/07/14, when she developed resting chest discomfort. Symptoms started approximately 30 minutes after completing a home physical therapy session. She noted severe substernal chest pain radiating  to her back between her shoulder blades. The pain was sharp in nature but not pleuritic. She took TUMS at home with mild relief. It lasted roughly 30 minutes to one hour. She denied any symptoms of dyspnea, lightheadedness or syncope/near-syncope at that time. Less than one hour later, she developed recurrent chest pain while attempting to eat a salad. This time she noted associated nausea, malaise and profuse diaphoresis. Symptoms resolved roughly 35-40 minutes after taking additional TUMS.  She has had no recurrent chest pain, however given her symptoms overnight, she decided to come 12/08/14  in for evaluation. She was chest pain-free in ED. She continues to denies  any dyspnea. Her EKG shows normal sinus rhythm and is unremarkable.   Hospital Course  Pt with history concerning for angina with prolonged chest discomfort and some diaphoresis, she was admitted for observation with plan to make decision in the morning regarding Myoview next morning if her enzymes were negative. Overnight patient denied any CP or SOB. Trop X 3 negative. Lytes normal. She threatened to leave hospital, as no one seen her early AM. At length discussion between the patient and Dr. Percival Spanish. Plan made to get out patient stress perfusion study or dobutamine echocardiogram. Patient schedule for outpatient Myoview and then f/u with APP. She was discharged stably. No change in medication during admission.   Discharge Vitals Blood pressure 130/65, pulse 75, temperature 98 F (36.7 C), temperature source Oral, resp. rate 18, height 5\' 5"  (1.651 m), weight 241 lb 12.8 oz (109.68 kg), SpO2 95 %.  Filed Weights   12/08/14 1552  Weight: 241 lb  12.8 oz (109.68 kg)    CBC  Recent Labs  12/08/14 1623 12/09/14 0350  WBC 9.4 9.2  HGB 11.2* 10.1*  HCT 33.8* 30.7*  MCV 91.6 91.9  PLT 308 778   Basic Metabolic Panel  Recent Labs  12/08/14 1623 12/09/14 0350  NA 133* 136  K 3.6 3.8  CL 98* 104  CO2 26 25  GLUCOSE 144*  142*  BUN 11 12  CREATININE 0.80 0.76  CALCIUM 9.3 8.9   Cardiac Enzymes  Recent Labs  12/08/14 1623 12/08/14 2150 12/09/14 0350  TROPONINI <0.03 <0.03 <0.03    Disposition  Pt is being discharged home today in good condition.  Follow-up Plans & Appointments  Follow-up Information    Follow up with [facility] On 12/19/2014.   Specialty:  Cardiology   Why:  @11 :30 for Myoview    Contact information:   [address] [place] [phone]      Follow up with Rosaria Ferries, PA-C On 12/21/2014.   Specialties:  Cardiology, Radiology   Why:  @10 :00 for cardiology follow up   Contact information:   [place] [address] [phone]       Discharge Instructions    Diet - low sodium heart healthy    Complete by:  As directed      Increase activity slowly    Complete by:  As directed           F/u Labs/Studies: None  Discharge Medications    Medication List    TAKE these medications        atenolol 50 MG tablet  Commonly known as:  TENORMIN  Take 50 mg by mouth every morning.     atorvastatin 10 MG tablet  Commonly known as:  LIPITOR  Take 10 mg by mouth every morning.     docusate sodium 100 MG capsule  Commonly known as:  COLACE  Take 100 mg by mouth daily.     losartan 100 MG tablet  Commonly known as:  COZAAR  Take 100 mg by mouth every morning.     methocarbamol 500 MG tablet  Commonly known as:  ROBAXIN  Take 1 tablet (500 mg total) by mouth every 6 (six) hours as needed for muscle spasms.     oxybutynin 10 MG 24 hr tablet  Commonly known as:  DITROPAN-XL  Take 10 mg by mouth every morning.     oxyCODONE 5 MG immediate release tablet  Commonly known as:  Oxy IR/ROXICODONE  Take 1-2 tablets (5-10 mg total) by mouth every 3 (three) hours as needed for moderate pain, severe pain or breakthrough pain.     polyvinyl alcohol 1.4 % ophthalmic solution    Commonly known as:  LIQUIFILM TEARS  Place 1 drop into both eyes 2 (two) times daily.     rivaroxaban 10 MG Tabs tablet  Commonly known as:  XARELTO  Take 1 tablet (10 mg total) by mouth daily with breakfast. Take Xarelto for two and a half more weeks, then discontinue Xarelto. Once the patient has completed the Xarelto, they may resume the 81 mg Aspirin.     sitaGLIPtin 25 MG tablet  Commonly known as:  JANUVIA  Take 25 mg by mouth every morning.     traMADol 50 MG tablet  Commonly known as:  ULTRAM  Take 1-2 tablets (50-100 mg total) by mouth every 6 (six) hours as needed (mild  pain).        Duration of Discharge Encounter   Greater than 30 minutes including physician time.  Signed, Shantia Sanford PA-C 12/09/2014, 11:40 AM

## 2014-12-09 NOTE — Progress Notes (Signed)
Pt reports not being seen by physician this am, nurse has page PA X2 since start of shift, no new orders at present, will continue to monitor and update patient, Edward Qualia RN

## 2014-12-09 NOTE — Telephone Encounter (Signed)
New message     Pt is in hospital 3 [place] room 19 Pt states she cannot get answers from nurse or anyone at hospital regarding her care Pt states that she has asked for the doctor/pa that is on call for hospital to visit her and she has not seen anyone

## 2014-12-09 NOTE — Telephone Encounter (Signed)
Pt has been discharges and has been schedule for an outpt myoview and f/u appt.

## 2014-12-14 ENCOUNTER — Telehealth (HOSPITAL_COMMUNITY): Payer: Self-pay | Admitting: *Deleted

## 2014-12-14 NOTE — Telephone Encounter (Signed)
Left message on voicemail in reference to upcoming appointment scheduled for 12/19/14. Phone number given for a call back so details instructions can be given. Hubbard Robinson, RN

## 2014-12-19 ENCOUNTER — Ambulatory Visit (HOSPITAL_COMMUNITY): Payer: 59

## 2014-12-20 ENCOUNTER — Ambulatory Visit: Payer: 59 | Attending: Orthopedic Surgery

## 2014-12-20 ENCOUNTER — Ambulatory Visit (HOSPITAL_COMMUNITY): Payer: 59

## 2014-12-20 DIAGNOSIS — R269 Unspecified abnormalities of gait and mobility: Secondary | ICD-10-CM | POA: Diagnosis present

## 2014-12-20 DIAGNOSIS — M25561 Pain in right knee: Secondary | ICD-10-CM | POA: Insufficient documentation

## 2014-12-20 DIAGNOSIS — R29898 Other symptoms and signs involving the musculoskeletal system: Secondary | ICD-10-CM | POA: Insufficient documentation

## 2014-12-20 DIAGNOSIS — M2548 Effusion, other site: Secondary | ICD-10-CM | POA: Diagnosis present

## 2014-12-20 DIAGNOSIS — M25661 Stiffness of right knee, not elsewhere classified: Secondary | ICD-10-CM | POA: Diagnosis not present

## 2014-12-20 DIAGNOSIS — M25469 Effusion, unspecified knee: Secondary | ICD-10-CM

## 2014-12-20 NOTE — Therapy (Signed)
[facility] [address], [place], [place], [address] Phone: [phone]   Fax:  [phone]  Physical Therapy Evaluation  Patient Details  Name: Jessica Bowers MRN: [id number] Date of Birth: 08/17/1950 Referring Provider:  Gaynelle Arabian, MD  Encounter Date: 12/20/2014      PT End of Session - 12/20/14 1012    Visit Number 1   Date for PT Re-Evaluation 02/14/15   PT Start Time 0931   PT Stop Time 1025   PT Time Calculation (min) 54 min   Activity Tolerance Patient tolerated treatment well   Behavior During Therapy [facility] for tasks assessed/performed      Past Medical History  Diagnosis Date  . Obesity   . Hyperlipidemia   . Diabetes   . HTN (hypertension)   . GERD (gastroesophageal reflux disease)   . CKD (chronic kidney disease)   . Patellar tendinitis   . LEG PAIN, LEFT   . Arthritis   . Varicose veins   . Varicose vein     bilat   . Bronchitis     hx of   . Shaking     hands  . Shingles     hx of   . History of chicken pox   . Measles     hx of   . Mumps     hx of     Past Surgical History  Procedure Laterality Date  . Tonsillectomy    . Lasik    . Knee surgery  2005    right  . Colonoscopy    . Abdominal hysterectomy  1997  . Excision haglund's deformity with achilles tendon repair Left 05/06/2013    Procedure: LEFT ACHILLES DEBRIDEMENT AND RESECTION / HAGLUND'S EXCISION;  Surgeon: Wylene Simmer, MD;  Location: [place];  Service: Orthopedics;  Laterality: Left;  . Gastrocnemius recession Left 05/06/2013    Procedure: LEFT GASTROC RECESSION;  Surgeon: Wylene Simmer, MD;  Location: [place];  Service: Orthopedics;  Laterality: Left;  . Carpal tunnel release Left 04/07/2014    Procedure: LEFT CARPAL TUNNEL RELEASE;  Surgeon: Daryll Brod, MD;  Location: [place];  Service: Orthopedics;  Laterality: Left;  . Varicose vein surgery    .  stab  plebectomy      03/21/2014   . Total knee arthroplasty Right 11/28/2014    Procedure: TOTAL RIGHT KNEE ARTHROPLASTY;  Surgeon: Gaynelle Arabian, MD;  Location: [facility];  Service: Orthopedics;  Laterality: Right;    There were no vitals filed for this visit.  Visit Diagnosis:  Knee stiffness, right - Plan: PT plan of care cert/re-cert  Knee pain, acute, right - Plan: PT plan of care cert/re-cert  Swelling of joint of lower leg - Plan: PT plan of care cert/re-cert  Weakness of right leg - Plan: PT plan of care cert/re-cert  Abnormality of gait - Plan: PT plan of care cert/re-cert      Subjective Assessment - 12/20/14 0939    Subjective Pt presents to PT s/p Rt total knee replacement 11/28/14 due to OA.  Pt had home health PT for 3 weeks.     Pertinent History Lt Achilles tendon repair 04/22/14   Limitations Sitting;Standing;Walking   How long can you stand comfortably? 15 minutes   How long can you walk comfortably? 10-15 minutes with use of cane   Patient Stated Goals wean from cane, increase ROM of knee, improve strength and endurance   Currently  in Pain? Yes   Pain Score 4    Pain Location Knee   Pain Orientation Right   Pain Descriptors / Indicators Aching   Pain Type Surgical pain   Pain Onset 1 to 4 weeks ago   Pain Frequency Constant   Aggravating Factors  standing, walking, sitting   Pain Relieving Factors medication, ice, rest/non weightbearing            [facility] PT Assessment - 12/20/14 0001    Assessment   Medical Diagnosis s/p Total knee replacement (C78.938)   Onset Date/Surgical Date 11/28/14   Next MD Visit 01/03/15   Prior Therapy home health   Precautions   Precautions None   Restrictions   Weight Bearing Restrictions No   Balance Screen   Has the patient fallen in the past 6 months No   Has the patient had a decrease in activity level because of a fear of falling?  No   Is the patient reluctant to leave their home because of a fear of falling?  No   Home  Environment   Living Environment Private residence   Living Arrangements Spouse/significant other   Type of [place] to enter   Home Layout Two level   Alternate Level Stairs-Number of Steps 14   Alternate [place] - single point;Walker - 2 wheels;Shower seat;Transport chair   Prior Function   Level of Independence Independent   Vocation Full time employment   [employer] of work until 02/20/15.  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   Leisure going to gym: 3x/wk prior to surgery, bike at home   Cognition   Overall Cognitive Status Within Functional Limits for tasks assessed   Observation/Other Assessments   Focus on Therapeutic Outcomes (FOTO)  63% limitation   Posture/Postural Control   Posture/Postural Control Postural limitations   Posture Comments ER at bil hips   ROM / Strength   AROM / PROM / Strength AROM;PROM;Strength   AROM   Overall AROM  Deficits   Overall AROM Comments Lt knee AROM is full   AROM Assessment Site Knee   Right/Left Knee Right   Right Knee Extension 16   Right Knee Flexion 76   PROM   Overall PROM  Deficits   PROM Assessment Site Knee   Right/Left Knee Right   Right Knee Extension 14   Right Knee Flexion 81   Strength   Overall Strength Deficits   Strength Assessment Site Knee;Hip   Right/Left Hip Right;Left   Right Hip Flexion 4-/5   Right Hip ABduction 4-/5   Left Hip Flexion 4+/5   Left Hip Extension 4+/5   Left Hip ABduction 4+/5   Right/Left Knee Right;Left   Right Knee Flexion 4-/5   Right Knee Extension 4-/5   Left Knee Flexion 4+/5   Left Knee Extension 4+/5   Palpation   Patella mobility Reduced patellar mobility on the Rt in all directions.   Palpation comment well healing incision with steristrips present.  Mid patellar edema: Lt 19 inches, Rt 19.75 inches   Transfers   Transfers Sit to Stand   Sit to Stand 6: Modified independent  (Device/Increase time);With armrests   Ambulation/Gait   Ambulation/Gait Yes   Ambulation/Gait Assistance 6: Modified independent (Device/Increase time)   Ambulation Distance (Feet) 100 Feet   Assistive device Straight cane   Gait Pattern Step-through pattern;Decreased stance time - right;Decreased hip/knee flexion -  right;Decreased trunk rotation   Stairs Yes   Stairs Assistance 6: Modified independent (Device/Increase time)   Stair Management Technique Step to pattern                   [facility] Adult PT Treatment/Exercise - 12/20/14 0001    Exercises   Exercises Knee/Hip   Knee/Hip Exercises: Aerobic   Stationary Bike for ROMx 6 minutes   Modalities   Modalities Vasopneumatic   Vasopneumatic   Number Minutes Vasopneumatic  15 minutes   Vasopnuematic Location  Knee   Vasopneumatic Pressure Medium   Vasopneumatic Temperature  3 snowflakes                  PT Short Term Goals - 12/20/14 1015    PT SHORT TERM GOAL #1   Title be independent in initial HEP   Time 4   Period Weeks   Status New   PT SHORT TERM GOAL #2   Title demonstrate Rt knee AROM extension to lacking < or = to 10 degrees to normalize gait pattern   Time 4   Period Weeks   Status New   PT SHORT TERM GOAL #3   Title increase Rt LE strength to stand and walk for 20 minutes without limitation   Time 4   Period Weeks   Status New   PT SHORT TERM GOAL #4   Title ascend steps with step-over-step gait pattern   Time 4   Period Weeks   Status New   PT SHORT TERM GOAL #5   Title demonstrate Rt knee AROM flexion to > or = to 100 degrees to sit and negotiate steps without substitution   Time 4   Period Weeks   Status New           PT Long Term Goals - 12/20/14 0931    PT LONG TERM GOAL #1   Title be independent in advanced HEP   Time 8   Period Weeks   Status New   PT LONG TERM GOAL #2   Title reduce FOTO to < or = to 45% limitation   Time 8   Period Weeks   Status New   PT  LONG TERM GOAL #3   Title wean from cane > or = to 50% of the time and demonstrate symmetry on level surface   Time 8   Period Weeks   Status New   PT LONG TERM GOAL #4   Title report < or = to 3/10 Rt knee pain with ambulating in the community    Time 8   Period Weeks   Status New   PT LONG TERM GOAL #5   Title ascend and descend steps with step-over-step gait > 60% of the time   Time 8   Period Weeks   Status New   Additional Long Term Goals   Additional Long Term Goals Yes   PT LONG TERM GOAL #6   Title demonstrate 4+/5 Rt hip and knee strength to improve endurance for community ambulation and return to work   Time 8   Period Weeks   Status New   PT LONG TERM GOAL #7   Title demonstrate Rt knee flexion to > or = to 110 degrees to allow for squatting and steps without limitation   Time 8   Period Weeks   Status New               Plan - 12/20/14 1012  Clinical Impression Statement Pt presnts to PT ~3 weeks s/p Rt TKA.  Pt demonstrates 14-80 degrees of Rt knee PROM.  Pt with gait abnormality, edema and Rt LE weakness.  FOTO score is 63% limitation.  Pt will beneift from skilled PT for edema management, ROM, strength and gait training to allow for return to prior level of function.     Pt will benefit from skilled therapeutic intervention in order to improve on the following deficits Abnormal gait;Decreased range of motion;Difficulty walking;Pain;Increased edema;Decreased strength;Decreased scar mobility;Decreased activity tolerance;Decreased endurance   Rehab Potential Excellent   PT Frequency 3x / week   PT Duration 8 weeks   PT Treatment/Interventions ADLs/Self Care Home Management;Cryotherapy;Electrical Stimulation;Moist Heat;Therapeutic exercise;Therapeutic activities;Functional mobility training;Stair training;Gait training;Ultrasound;Neuromuscular re-education;Patient/family education;Manual techniques;Vasopneumatic Device;Taping;Passive range of motion;Scar  mobilization   PT Next Visit Plan Rt knee ROM, hip and knee strength, gait training, stair training   Consulted and Agree with Plan of Care Patient         Problem List Patient Active Problem List   Diagnosis Date Noted  . Chest pain 12/08/2014  . OA (osteoarthritis) of knee 11/28/2014  . Varicose veins of leg with complications 27/10/8673  . Varicose veins of lower extremities with other complications 44/92/0100  . Hyperlipidemia   . Diabetes   . HTN (hypertension)   . GERD (gastroesophageal reflux disease)   . CKD (chronic kidney disease)   . PATELLAR TENDINITIS 07/13/2009  . LEG PAIN, LEFT 07/13/2009    Paw Karstens, PT 12/20/2014, 10:33 AM  [facility] [address] [place], [place], [address] Phone: [phone]   Fax:  [phone]

## 2014-12-21 ENCOUNTER — Encounter: Payer: Self-pay | Admitting: Physician Assistant

## 2014-12-21 ENCOUNTER — Ambulatory Visit (INDEPENDENT_AMBULATORY_CARE_PROVIDER_SITE_OTHER): Payer: 59 | Admitting: Physician Assistant

## 2014-12-21 VITALS — BP 120/80 | HR 94 | Ht 65.0 in | Wt 240.0 lb

## 2014-12-21 DIAGNOSIS — Z87898 Personal history of other specified conditions: Secondary | ICD-10-CM

## 2014-12-21 NOTE — Progress Notes (Signed)
Cardiology Office Note   Date:  12/21/2014   ID:  Jessica Bowers, DOB 1950/04/18, MRN [id number]  PCP:  Tawanna Solo, MD  Cardiologist:  Dr. Daria Pastures, PA-C   Chief Complaint  Patient presents with  . Chest Pain    History of Present Illness: Jessica Bowers is a 64 y.o. female with a history of IDDM, HTN, HL, morbid obesity, and Myoview in 2014 with possible ischemia felt due to breast attenuation, EF normal. She was hospitalized for chest pain and discharged on 12/09/2014 with plans for an outpatient Myoview. The Myoview has not been performed.  Jessica Bowers presents for follow-up.   She expresses anger that she was left nothing by mouth until the afternoon without any study ordered and that she was given subcutaneous insulin and she is not on this at home.  Since discharge from the hospital, her symptoms have resolved. She has had no additional episodes of chest pain. She denies new dyspnea on exertion. She is still recovering from her knee surgery, but has completed rehabilitation at home and will start going to rehabilitation as an outpatient soon. She is looking forward to increasing mobility and strength.    She has not had any chest pain or shortness of breath with the rehabilitation. I discussed the most strenuous thing she is currently doing. She denies orthopnea or PND. She has not had any new lower extremity edema, although she does get some swelling during the day.   Past Medical History  Diagnosis Date  . Obesity   . Hyperlipidemia   . Diabetes   . HTN (hypertension)   . GERD (gastroesophageal reflux disease)   . CKD (chronic kidney disease)   . Patellar tendinitis   . LEG PAIN, LEFT   . Arthritis   . Varicose veins   . Varicose vein     bilat   . Bronchitis     hx of   . Shaking     hands  . Shingles     hx of   . History of chicken pox   . Measles     hx of   . Mumps     hx of     Past Surgical History  Procedure  Laterality Date  . Tonsillectomy    . Lasik    . Knee surgery  2005    right  . Colonoscopy    . Abdominal hysterectomy  1997  . Excision haglund's deformity with achilles tendon repair Left 05/06/2013    Procedure: LEFT ACHILLES DEBRIDEMENT AND RESECTION / HAGLUND'S EXCISION;  Surgeon: Wylene Simmer, MD;  Location: [place];  Service: Orthopedics;  Laterality: Left;  . Gastrocnemius recession Left 05/06/2013    Procedure: LEFT GASTROC RECESSION;  Surgeon: Wylene Simmer, MD;  Location: [place];  Service: Orthopedics;  Laterality: Left;  . Carpal tunnel release Left 04/07/2014    Procedure: LEFT CARPAL TUNNEL RELEASE;  Surgeon: Daryll Brod, MD;  Location: [place];  Service: Orthopedics;  Laterality: Left;  . Varicose vein surgery    .  stab plebectomy      03/21/2014   . Total knee arthroplasty Right 11/28/2014    Procedure: TOTAL RIGHT KNEE ARTHROPLASTY;  Surgeon: Gaynelle Arabian, MD;  Location: [facility];  Service: Orthopedics;  Laterality: Right;    Current Outpatient Prescriptions  Medication Sig Dispense Refill  . amoxicillin (AMOXIL) 500 MG capsule Take 500 mg by mouth 2 (two) times  daily. FOR DENTAL APPT.  0  . aspirin 81 MG tablet Take 81 mg by mouth daily.    Marland Kitchen atenolol (TENORMIN) 50 MG tablet Take 50 mg by mouth every morning.     Marland Kitchen atorvastatin (LIPITOR) 10 MG tablet Take 10 mg by mouth every morning.     Marland Kitchen losartan (COZAAR) 100 MG tablet Take 100 mg by mouth every morning.     Marland Kitchen omega-3 acid ethyl esters (LOVAZA) 1 G capsule Take by mouth 2 (two) times daily.    Marland Kitchen oxybutynin (DITROPAN-XL) 10 MG 24 hr tablet Take 10 mg by mouth every morning.     Marland Kitchen oxyCODONE (OXY IR/ROXICODONE) 5 MG immediate release tablet Take 1-2 tablets (5-10 mg total) by mouth every 3 (three) hours as needed for moderate pain, severe pain or breakthrough pain. 80 tablet 0  . polyvinyl alcohol (LIQUIFILM TEARS) 1.4 % ophthalmic solution Place 1 drop into both eyes 2  (two) times daily.    . sitaGLIPtin (JANUVIA) 25 MG tablet Take 25 mg by mouth every morning.     . traMADol (ULTRAM) 50 MG tablet Take 1-2 tablets (50-100 mg total) by mouth every 6 (six) hours as needed (mild pain). 60 tablet 1  . vitamin B-12 (CYANOCOBALAMIN) 100 MCG tablet Take 100 mcg by mouth daily.     No current facility-administered medications for this visit.    Allergies:   Ace inhibitors; Crestor; Ezetimibe; Glucophage; Metformin and related; Nsaids; Pioglitazone; Vasotec; and Decadron    Social History:  The patient  reports that she has never smoked. She has never used smokeless tobacco. She reports that she drinks alcohol. She reports that she does not use illicit drugs.   Family History:  The patient's family history includes CAD in her mother; Diabetes in her father; Heart attack in her mother; Hyperlipidemia in her mother; Hypertension in her father; Stroke in her mother.    ROS:  Please see the history of present illness. All other systems are reviewed and negative.    PHYSICAL EXAM: VS:  BP 120/80 mmHg  Pulse 94  Ht 5\' 5"  (1.651 m)  Wt 240 lb (108.863 kg)  BMI 39.94 kg/m2  SpO2 97% , BMI Body mass index is 39.94 kg/(m^2). GEN: Well nourished, well developed, in no acute distress HEENT: normal Neck: no JVD, carotid bruits, or masses Cardiac: RRR; no murmurs, rubs, or gallops,no edema  Respiratory:  clear to auscultation bilaterally, normal work of breathing GI: soft, nontender, nondistended, + BS MS: no deformity or atrophy; trace lower extremity edema, right lower extremity does not have full mobility and strength yet. Skin: warm and dry, no rash Neuro: Other than right lower extremity, Strength and sensation are intact Psych: euthymic mood, full affect   EKG:  EKG is not ordered today.  Recent Labs: 11/15/2014: ALT 37 12/09/2014: BUN 12; Creatinine, Ser 0.76; Hemoglobin 10.1*; Platelets 288; Potassium 3.8; Sodium 136    Lipid Panel    Component Value  Date/Time   CHOL 142 08/25/2013 0950   TRIG 54.0 08/25/2013 0950   HDL 69.50 08/25/2013 0950   CHOLHDL 2 08/25/2013 0950   VLDL 10.8 08/25/2013 0950   LDLCALC 62 08/25/2013 0950     Wt Readings from Last 3 Encounters:  12/21/14 240 lb (108.863 kg)  12/08/14 241 lb 12.8 oz (109.68 kg)  12/08/14 243 lb (110.224 kg)     Other studies Reviewed: Additional studies/ records that were reviewed today include: Hospital records, previous ECG's and office notes.  ASSESSMENT  AND PLAN:  1.  History of precordial chest pain: Her symptoms have resolved. Currently, she wishes to focus on rehabilitation for her right lower extremity and does not feel that further evaluation of her chest pain as needed. She is compliant with her medications including beta blocker and statin.  She is encouraged to continue increasing her activity, and let us know if she has additional episodes of chest pain.  The issues with her being held with nothing by mouth and no test order were discussed, and I apologized for this. I explained the reason for the sliding scale insulin and the patient understood.  Current medicines are reviewed at length with the patient today.  The patient does not have concerns regarding medicines.  The following changes have been made:  no change  Labs/ tests ordered today include:  No orders of the defined types were placed in this encounter.    Disposition:   FU with Dr. Marlou Porch   Signed, Lenoard Aden  12/21/2014 5:04 PM    [facility] [address], [place], [place]  [address] Phone: [phone]; Fax: [phone]

## 2014-12-21 NOTE — Patient Instructions (Addendum)
Medication Instructions:  Your physician recommends that you continue on your current medications as directed. Please refer to the Current Medication list given to you today.    Labwork: None ordered   Testing/Procedures: None ordered  Follow-Up: Your physician recommends that you schedule a follow-up appointment in: 3 MONTHS WITH DR. Marlou Porch     Any Other Special Instructions Will Be Listed Below (If Applicable).  If you decide that you would like to have the stress test done, just give our office a call [phone].

## 2014-12-22 ENCOUNTER — Encounter: Payer: Self-pay | Admitting: Physical Therapy

## 2014-12-22 ENCOUNTER — Ambulatory Visit: Payer: 59 | Admitting: Physical Therapy

## 2014-12-22 DIAGNOSIS — M25661 Stiffness of right knee, not elsewhere classified: Secondary | ICD-10-CM

## 2014-12-22 DIAGNOSIS — R269 Unspecified abnormalities of gait and mobility: Secondary | ICD-10-CM

## 2014-12-22 DIAGNOSIS — R29898 Other symptoms and signs involving the musculoskeletal system: Secondary | ICD-10-CM

## 2014-12-22 DIAGNOSIS — M25469 Effusion, unspecified knee: Secondary | ICD-10-CM

## 2014-12-22 DIAGNOSIS — M25561 Pain in right knee: Secondary | ICD-10-CM

## 2014-12-22 NOTE — Therapy (Signed)
[facility] [address], [place], [place], [address] Phone: [phone]   Fax:  [phone]  Physical Therapy Treatment  Patient Details  Name: Jessica Bowers MRN: [id number] Date of Birth: 1950/12/28 Referring Provider:  Gaynelle Arabian, MD  Encounter Date: 12/22/2014      PT End of Session - 12/22/14 1649    Visit Number 2   Date for PT Re-Evaluation 02/14/15   PT Start Time 1559   PT Stop Time 8916   PT Time Calculation (min) 59 min   Activity Tolerance Patient tolerated treatment well   Behavior During Therapy [facility] for tasks assessed/performed      Past Medical History  Diagnosis Date  . Obesity   . Hyperlipidemia   . Diabetes   . HTN (hypertension)   . GERD (gastroesophageal reflux disease)   . CKD (chronic kidney disease)   . Patellar tendinitis   . LEG PAIN, LEFT   . Arthritis   . Varicose veins   . Varicose vein     bilat   . Bronchitis     hx of   . Shaking     hands  . Shingles     hx of   . History of chicken pox   . Measles     hx of   . Mumps     hx of     Past Surgical History  Procedure Laterality Date  . Tonsillectomy    . Lasik    . Knee surgery  2005    right  . Colonoscopy    . Abdominal hysterectomy  1997  . Excision haglund's deformity with achilles tendon repair Left 05/06/2013    Procedure: LEFT ACHILLES DEBRIDEMENT AND RESECTION / HAGLUND'S EXCISION;  Surgeon: Wylene Simmer, MD;  Location: [place];  Service: Orthopedics;  Laterality: Left;  . Gastrocnemius recession Left 05/06/2013    Procedure: LEFT GASTROC RECESSION;  Surgeon: Wylene Simmer, MD;  Location: [place];  Service: Orthopedics;  Laterality: Left;  . Carpal tunnel release Left 04/07/2014    Procedure: LEFT CARPAL TUNNEL RELEASE;  Surgeon: Daryll Brod, MD;  Location: [place];  Service: Orthopedics;  Laterality: Left;  . Varicose vein surgery    .  stab  plebectomy      03/21/2014   . Total knee arthroplasty Right 11/28/2014    Procedure: TOTAL RIGHT KNEE ARTHROPLASTY;  Surgeon: Gaynelle Arabian, MD;  Location: [facility];  Service: Orthopedics;  Laterality: Right;    There were no vitals filed for this visit.  Visit Diagnosis:  Knee stiffness, right  Knee pain, acute, right  Swelling of joint of lower leg  Weakness of right leg  Abnormality of gait      Subjective Assessment - 12/22/14 1645    Subjective Pt reports stiffness in Rt knee, after Rt TKA on 11/28/14. Reports using ice to help sleep in the night   Currently in Pain? Yes   Pain Score 4    Pain Location Knee   Pain Orientation Right   Pain Descriptors / Indicators Aching;Tightness   Pain Type Surgical pain   Pain Onset 1 to 4 weeks ago   Pain Frequency Constant   Multiple Pain Sites Yes   Pain Score 8   Pain Location Back   Pain Orientation Lower   Pain Descriptors / Indicators Aching;Discomfort   Pain Type Acute pain   Pain Frequency Rarely  [facility] Adult PT Treatment/Exercise - 12/22/14 0001    Exercises   Exercises Knee/Hip   Knee/Hip Exercises: Stretches   Quad Stretch 3 reps;20 seconds  on stairs   Other Knee/Hip Stretches sitting flex  with 5 sec hold x 10   Knee/Hip Exercises: Aerobic   Stationary Bike for ROMx 16 minutes   Knee/Hip Exercises: Standing   Rebounder 3 directions 21min x 3  with both UE support   Knee/Hip Exercises: Seated   Long Arc Quad AROM;Strengthening;20 reps;Weights  2#   Modalities   Modalities Vasopneumatic   Vasopneumatic   Number Minutes Vasopneumatic  15 minutes   Vasopnuematic Location  Knee   Vasopneumatic Pressure Medium   Vasopneumatic Temperature  3 snowflakes   Manual Therapy   Manual Therapy Soft tissue mobilization  to Rt knee retrograde massage to decr edema                  PT Short Term Goals - 12/20/14 1015    PT SHORT TERM GOAL #1   Title be independent in  initial HEP   Time 4   Period Weeks   Status New   PT SHORT TERM GOAL #2   Title demonstrate Rt knee AROM extension to lacking < or = to 10 degrees to normalize gait pattern   Time 4   Period Weeks   Status New   PT SHORT TERM GOAL #3   Title increase Rt LE strength to stand and walk for 20 minutes without limitation   Time 4   Period Weeks   Status New   PT SHORT TERM GOAL #4   Title ascend steps with step-over-step gait pattern   Time 4   Period Weeks   Status New   PT SHORT TERM GOAL #5   Title demonstrate Rt knee AROM flexion to > or = to 100 degrees to sit and negotiate steps without substitution   Time 4   Period Weeks   Status New           PT Long Term Goals - 12/20/14 0931    PT LONG TERM GOAL #1   Title be independent in advanced HEP   Time 8   Period Weeks   Status New   PT LONG TERM GOAL #2   Title reduce FOTO to < or = to 45% limitation   Time 8   Period Weeks   Status New   PT LONG TERM GOAL #3   Title wean from cane > or = to 50% of the time and demonstrate symmetry on level surface   Time 8   Period Weeks   Status New   PT LONG TERM GOAL #4   Title report < or = to 3/10 Rt knee pain with ambulating in the community    Time 8   Period Weeks   Status New   PT LONG TERM GOAL #5   Title ascend and descend steps with step-over-step gait > 60% of the time   Time 8   Period Weeks   Status New   Additional Long Term Goals   Additional Long Term Goals Yes   PT LONG TERM GOAL #6   Title demonstrate 4+/5 Rt hip and knee strength to improve endurance for community ambulation and return to work   Time 8   Period Weeks   Status New   PT LONG TERM GOAL #7   Title demonstrate Rt knee flexion to > or = to 110 degrees  to allow for squatting and steps without limitation   Time 8   Period Weeks   Status New               Plan - 12/22/14 1650    Clinical Impression Statement Pt presents to PT ~3weeks s/p Rt TKA. Pt had only evaluation no major  changes reported, but able to perform activities with PT well, but limited by swelling and decreased ROM right knee. Pt will continue to benefit from PT for edema managment, ROM, strength and gait training   Pt will benefit from skilled therapeutic intervention in order to improve on the following deficits Abnormal gait;Decreased range of motion;Difficulty walking;Pain;Increased edema;Decreased strength;Decreased scar mobility;Decreased activity tolerance;Decreased endurance   Rehab Potential Excellent   PT Frequency 3x / week   PT Duration 8 weeks   PT Treatment/Interventions ADLs/Self Care Home Management;Cryotherapy;Electrical Stimulation;Moist Heat;Therapeutic exercise;Therapeutic activities;Functional mobility training;Stair training;Gait training;Ultrasound;Neuromuscular re-education;Patient/family education;Manual techniques;Vasopneumatic Device;Taping;Passive range of motion;Scar mobilization   PT Next Visit Plan Continue Rt knee ROM, hip and knee strength, gait training, stair training   Consulted and Agree with Plan of Care Patient        Problem List Patient Active Problem List   Diagnosis Date Noted  . Chest pain 12/08/2014  . OA (osteoarthritis) of knee 11/28/2014  . Varicose veins of leg with complications 40/98/1191  . Varicose veins of lower extremities with other complications 47/82/9562  . Hyperlipidemia   . Diabetes   . HTN (hypertension)   . GERD (gastroesophageal reflux disease)   . CKD (chronic kidney disease)   . PATELLAR TENDINITIS 07/13/2009  . LEG PAIN, LEFT 07/13/2009    NAUMANN-HOUEGNIFIO,Shigeo Baugh PTA 12/22/2014, 4:53 PM  [facility] [address], [place], [place], [address] Phone: [phone]   Fax:  [phone]

## 2014-12-26 ENCOUNTER — Ambulatory Visit: Payer: 59 | Admitting: Physical Therapy

## 2014-12-26 ENCOUNTER — Encounter: Payer: Self-pay | Admitting: Physical Therapy

## 2014-12-26 DIAGNOSIS — M25661 Stiffness of right knee, not elsewhere classified: Secondary | ICD-10-CM

## 2014-12-26 DIAGNOSIS — R269 Unspecified abnormalities of gait and mobility: Secondary | ICD-10-CM

## 2014-12-26 DIAGNOSIS — M25469 Effusion, unspecified knee: Secondary | ICD-10-CM

## 2014-12-26 DIAGNOSIS — R29898 Other symptoms and signs involving the musculoskeletal system: Secondary | ICD-10-CM

## 2014-12-26 DIAGNOSIS — M25561 Pain in right knee: Secondary | ICD-10-CM

## 2014-12-26 NOTE — Therapy (Signed)
[facility] [address], [place], [place], [address] Phone: [phone]   Fax:  [phone]  Physical Therapy Treatment  Patient Details  Name: Jessica Bowers MRN: [id number] Date of Birth: 1950-12-21 Referring Provider:  Gaynelle Arabian, MD  Encounter Date: 12/26/2014      PT End of Session - 12/26/14 1245    Visit Number 3   Date for PT Re-Evaluation 02/14/15   PT Start Time 1226   PT Stop Time 1320   PT Time Calculation (min) 54 min   Activity Tolerance Patient tolerated treatment well   Behavior During Therapy [facility] for tasks assessed/performed      Past Medical History  Diagnosis Date  . Obesity   . Hyperlipidemia   . Diabetes   . HTN (hypertension)   . GERD (gastroesophageal reflux disease)   . CKD (chronic kidney disease)   . Patellar tendinitis   . LEG PAIN, LEFT   . Arthritis   . Varicose veins   . Varicose vein     bilat   . Bronchitis     hx of   . Shaking     hands  . Shingles     hx of   . History of chicken pox   . Measles     hx of   . Mumps     hx of     Past Surgical History  Procedure Laterality Date  . Tonsillectomy    . Lasik    . Knee surgery  2005    right  . Colonoscopy    . Abdominal hysterectomy  1997  . Excision haglund's deformity with achilles tendon repair Left 05/06/2013    Procedure: LEFT ACHILLES DEBRIDEMENT AND RESECTION / HAGLUND'S EXCISION;  Surgeon: Wylene Simmer, MD;  Location: [place];  Service: Orthopedics;  Laterality: Left;  . Gastrocnemius recession Left 05/06/2013    Procedure: LEFT GASTROC RECESSION;  Surgeon: Wylene Simmer, MD;  Location: [place];  Service: Orthopedics;  Laterality: Left;  . Carpal tunnel release Left 04/07/2014    Procedure: LEFT CARPAL TUNNEL RELEASE;  Surgeon: Daryll Brod, MD;  Location: [place];  Service: Orthopedics;  Laterality: Left;  . Varicose vein surgery    .  stab  plebectomy      03/21/2014   . Total knee arthroplasty Right 11/28/2014    Procedure: TOTAL RIGHT KNEE ARTHROPLASTY;  Surgeon: Gaynelle Arabian, MD;  [facility];  Service: Orthopedics;  Laterality: Right;    There were no vitals filed for this visit.  Visit Diagnosis:  Knee stiffness, right  Knee pain, acute, right  Swelling of joint of lower leg  Weakness of right leg  Abnormality of gait      Subjective Assessment - 12/26/14 1229    Subjective Knee is stiff this AM. I have not done anything yet today.   Currently in Pain? No/denies  Just stiff   Aggravating Factors  Standing, sitting, walking too long.    Pain Relieving Factors Meds, ice, rest   Multiple Pain Sites No            [facility] PT Assessment - 12/26/14 0001    AROM   Right Knee Extension 20  Supine    Right Knee Flexion 95                     [facility] Adult PT Treatment/Exercise - 12/26/14 0001    Knee/Hip Exercises: Stretches  Quad Stretch Right;3 reps;20 seconds  rocking first.   Knee/Hip Exercises: Aerobic   Stationary Bike rocking x 6 min   Nustep L1 x 5 min   Knee/Hip Exercises: Standing   Rebounder 3 directions 58min x 3  with both UE support   Knee/Hip Exercises: Seated   Long Arc Quad AROM;Strengthening;20 reps;Weights  3#   Knee/Hip Exercises: Supine   Other Supine Knee/Hip Exercises KNee flexion AROM 20x   Vasopneumatic   Number Minutes Vasopneumatic  15 minutes   Vasopnuematic Location  Knee   Vasopneumatic Pressure Medium   Vasopneumatic Temperature  3 snowflakes   Manual Therapy   Manual Therapy Soft tissue mobilization  to Rt knee retrograde massage to decr edema                  PT Short Term Goals - 12/26/14 1306    PT SHORT TERM GOAL #1   Title be independent in initial HEP   Time 4   Period Weeks   Status New   PT SHORT TERM GOAL #2   Title demonstrate Rt knee AROM extension to lacking < or = to 10 degrees to normalize gait pattern   Time 4    Period Weeks   Status On-going  20 degress   PT SHORT TERM GOAL #3   Title increase Rt LE strength to stand and walk for 20 minutes without limitation   Time 4   Period Weeks   Status Achieved  30 min   PT SHORT TERM GOAL #4   Title ascend steps with step-over-step gait pattern   Time 4   Period Weeks   Status On-going           PT Long Term Goals - 12/20/14 0931    PT LONG TERM GOAL #1   Title be independent in advanced HEP   Time 8   Period Weeks   Status New   PT LONG TERM GOAL #2   Title reduce FOTO to < or = to 45% limitation   Time 8   Period Weeks   Status New   PT LONG TERM GOAL #3   Title wean from cane > or = to 50% of the time and demonstrate symmetry on level surface   Time 8   Period Weeks   Status New   PT LONG TERM GOAL #4   Title report < or = to 3/10 Rt knee pain with ambulating in the community    Time 8   Period Weeks   Status New   PT LONG TERM GOAL #5   Title ascend and descend steps with step-over-step gait > 60% of the time   Time 8   Period Weeks   Status New   Additional Long Term Goals   Additional Long Term Goals Yes   PT LONG TERM GOAL #6   Title demonstrate 4+/5 Rt hip and knee strength to improve endurance for community ambulation and return to work   Time 8   Period Weeks   Status New   PT LONG TERM GOAL #7   Title demonstrate Rt knee flexion to > or = to 110 degrees to allow for squatting and steps without limitation   Time 8   Period Weeks   Status New               Problem List Patient Active Problem List   Diagnosis Date Noted  . Chest pain 12/08/2014  . OA (osteoarthritis) of knee  11/28/2014  . Varicose veins of leg with complications 32/99/2426  . Varicose veins of lower extremities with other complications 83/41/9622  . Hyperlipidemia   . Diabetes   . HTN (hypertension)   . GERD (gastroesophageal reflux disease)   . CKD (chronic kidney disease)   . PATELLAR TENDINITIS 07/13/2009  . LEG PAIN, LEFT  07/13/2009    COCHRAN,JENNIFER, PTA 12/26/2014, 1:08 PM  [facility] [address], [place], [place], [address] Phone: [phone]   Fax:  [phone]

## 2014-12-28 ENCOUNTER — Encounter: Payer: Self-pay | Admitting: Physical Therapy

## 2014-12-28 ENCOUNTER — Ambulatory Visit: Payer: 59 | Admitting: Physical Therapy

## 2014-12-28 DIAGNOSIS — M25561 Pain in right knee: Secondary | ICD-10-CM

## 2014-12-28 DIAGNOSIS — R269 Unspecified abnormalities of gait and mobility: Secondary | ICD-10-CM

## 2014-12-28 DIAGNOSIS — R29898 Other symptoms and signs involving the musculoskeletal system: Secondary | ICD-10-CM

## 2014-12-28 DIAGNOSIS — M25661 Stiffness of right knee, not elsewhere classified: Secondary | ICD-10-CM

## 2014-12-28 DIAGNOSIS — M25469 Effusion, unspecified knee: Secondary | ICD-10-CM

## 2014-12-28 NOTE — Therapy (Signed)
[facility] [address], [place], [place], [address] Phone: [phone]   Fax:  [phone]  Physical Therapy Treatment  Patient Details  Name: Jessica Bowers MRN: [id number] Date of Birth: 03-15-1951 Referring Provider:  Gaynelle Arabian, MD  Encounter Date: 12/28/2014      PT End of Session - 12/28/14 1302    Visit Number 4   Date for PT Re-Evaluation 02/14/15   PT Start Time 9935   PT Stop Time 1322   PT Time Calculation (min) 51 min   Activity Tolerance Patient tolerated treatment well   Behavior During Therapy [facility] for tasks assessed/performed      Past Medical History  Diagnosis Date  . Obesity   . Hyperlipidemia   . Diabetes   . HTN (hypertension)   . GERD (gastroesophageal reflux disease)   . CKD (chronic kidney disease)   . Patellar tendinitis   . LEG PAIN, LEFT   . Arthritis   . Varicose veins   . Varicose vein     bilat   . Bronchitis     hx of   . Shaking     hands  . Shingles     hx of   . History of chicken pox   . Measles     hx of   . Mumps     hx of     Past Surgical History  Procedure Laterality Date  . Tonsillectomy    . Lasik    . Knee surgery  2005    right  . Colonoscopy    . Abdominal hysterectomy  1997  . Excision haglund's deformity with achilles tendon repair Left 05/06/2013    Procedure: LEFT ACHILLES DEBRIDEMENT AND RESECTION / HAGLUND'S EXCISION;  Surgeon: Wylene Simmer, MD;  Location: [place];  Service: Orthopedics;  Laterality: Left;  . Gastrocnemius recession Left 05/06/2013    Procedure: LEFT GASTROC RECESSION;  Surgeon: Wylene Simmer, MD;  Location: [place];  Service: Orthopedics;  Laterality: Left;  . Carpal tunnel release Left 04/07/2014    Procedure: LEFT CARPAL TUNNEL RELEASE;  Surgeon: Daryll Brod, MD;  Location: [place];  Service: Orthopedics;  Laterality: Left;  . Varicose vein surgery    .  stab  plebectomy      03/21/2014   . Total knee arthroplasty Right 11/28/2014    Procedure: TOTAL RIGHT KNEE ARTHROPLASTY;  Surgeon: Gaynelle Arabian, MD;  [facility];  Service: Orthopedics;  Laterality: Right;    There were no vitals filed for this visit.  Visit Diagnosis:  Knee stiffness, right  Knee pain, acute, right  Swelling of joint of lower leg  Weakness of right leg  Abnormality of gait      Subjective Assessment - 12/28/14 1233    Subjective I am feeling good this morning. No pain or a feeling of stiffness.    Currently in Pain? No/denies   Multiple Pain Sites No            [facility] PT Assessment - 12/28/14 0001    AROM   Right Knee Flexion 97                     [facility] Adult PT Treatment/Exercise - 12/28/14 0001    Knee/Hip Exercises: Stretches   Active Hamstring Stretch Right;3 reps;20 seconds   Knee/Hip Exercises: Aerobic   Stationary Bike rocking x 6 min   Nustep L2 x 6 min  Knee/Hip Exercises: Machines for Strengthening   Total Gym Leg Press St#7 bil 60# 2x 10    Knee/Hip Exercises: Standing   Forward Step Up Right;2 sets;Hand Hold: 2;Step Height: 6"   Rebounder 3 directions 37min x 3  with both UE support   Knee/Hip Exercises: Seated   Long Arc Quad Strengthening;Right;2 sets;10 reps   Long Arc Quad Weight 2 lbs.   Knee/Hip Exercises: Supine   Short Arc Quad Sets Strengthening;Right;2 sets;10 reps   Short Arc Quad Sets Limitations 2#   Vasopneumatic   Number Minutes Vasopneumatic  15 minutes   Vasopnuematic Location  Knee   Vasopneumatic Pressure Medium   Vasopneumatic Temperature  3 snowflakes                  PT Short Term Goals - 12/26/14 1306    PT SHORT TERM GOAL #1   Title be independent in initial HEP   Time 4   Period Weeks   Status New   PT SHORT TERM GOAL #2   Title demonstrate Rt knee AROM extension to lacking < or = to 10 degrees to normalize gait pattern   Time 4   Period Weeks   Status On-going  20  degress   PT SHORT TERM GOAL #3   Title increase Rt LE strength to stand and walk for 20 minutes without limitation   Time 4   Period Weeks   Status Achieved  30 min   PT SHORT TERM GOAL #4   Title ascend steps with step-over-step gait pattern   Time 4   Period Weeks   Status On-going           PT Long Term Goals - 12/20/14 0931    PT LONG TERM GOAL #1   Title be independent in advanced HEP   Time 8   Period Weeks   Status New   PT LONG TERM GOAL #2   Title reduce FOTO to < or = to 45% limitation   Time 8   Period Weeks   Status New   PT LONG TERM GOAL #3   Title wean from cane > or = to 50% of the time and demonstrate symmetry on level surface   Time 8   Period Weeks   Status New   PT LONG TERM GOAL #4   Title report < or = to 3/10 Rt knee pain with ambulating in the community    Time 8   Period Weeks   Status New   PT LONG TERM GOAL #5   Title ascend and descend steps with step-over-step gait > 60% of the time   Time 8   Period Weeks   Status New   Additional Long Term Goals   Additional Long Term Goals Yes   PT LONG TERM GOAL #6   Title demonstrate 4+/5 Rt hip and knee strength to improve endurance for community ambulation and return to work   Time 8   Period Weeks   Status New   PT LONG TERM GOAL #7   Title demonstrate Rt knee flexion to > or = to 110 degrees to allow for squatting and steps without limitation   Time 8   Period Weeks   Status New               Plan - 12/28/14 1303    Clinical Impression Statement Added step ups, leg press, and resistance to LAQ/SAQ today without increasing pain. ROM improved since eval but  about the same this week. To MD next week. Still ambulates community distances with cane.    Pt will benefit from skilled therapeutic intervention in order to improve on the following deficits Abnormal gait;Decreased range of motion;Difficulty walking;Pain;Increased edema;Decreased strength;Decreased scar mobility;Decreased  activity tolerance;Decreased endurance   Rehab Potential Excellent   PT Frequency 3x / week   PT Duration 8 weeks   PT Treatment/Interventions ADLs/Self Care Home Management;Cryotherapy;Electrical Stimulation;Moist Heat;Therapeutic exercise;Therapeutic activities;Functional mobility training;Stair training;Gait training;Ultrasound;Neuromuscular re-education;Patient/family education;Manual techniques;Vasopneumatic Device;Taping;Passive range of motion;Scar mobilization   PT Next Visit Plan Continue Rt knee ROM, hip and knee strength, gait training, stair training   Consulted and Agree with Plan of Care Patient        Problem List Patient Active Problem List   Diagnosis Date Noted  . Chest pain 12/08/2014  . OA (osteoarthritis) of knee 11/28/2014  . Varicose veins of leg with complications 60/73/7106  . Varicose veins of lower extremities with other complications 26/94/8546  . Hyperlipidemia   . Diabetes   . HTN (hypertension)   . GERD (gastroesophageal reflux disease)   . CKD (chronic kidney disease)   . PATELLAR TENDINITIS 07/13/2009  . LEG PAIN, LEFT 07/13/2009    COCHRAN,JENNIFER, PTA 12/28/2014, 1:14 PM  [facility] [address] [place], [place], [address] Phone: [phone]   Fax:  [phone]

## 2014-12-30 ENCOUNTER — Ambulatory Visit: Payer: 59 | Admitting: Physical Therapy

## 2014-12-30 ENCOUNTER — Encounter: Payer: Self-pay | Admitting: Physical Therapy

## 2014-12-30 DIAGNOSIS — M25661 Stiffness of right knee, not elsewhere classified: Secondary | ICD-10-CM

## 2014-12-30 DIAGNOSIS — R29898 Other symptoms and signs involving the musculoskeletal system: Secondary | ICD-10-CM

## 2014-12-30 DIAGNOSIS — M25469 Effusion, unspecified knee: Secondary | ICD-10-CM

## 2014-12-30 DIAGNOSIS — R269 Unspecified abnormalities of gait and mobility: Secondary | ICD-10-CM

## 2014-12-30 DIAGNOSIS — M25561 Pain in right knee: Secondary | ICD-10-CM

## 2014-12-30 NOTE — Therapy (Signed)
[facility] [address] [place], [place], [address] Phone: [phone]   Fax:  [phone]  Physical Therapy Treatment  Patient Details  Name: Jessica Bowers MRN: [id number] Date of Birth: Oct 26, 1950 Referring Provider:  Gaynelle Arabian, MD  Encounter Date: 12/30/2014      PT End of Session - 12/30/14 1101    Visit Number 5   Date for PT Re-Evaluation 02/14/15   PT Start Time 1013   PT Stop Time 1115   PT Time Calculation (min) 62 min   Activity Tolerance Patient tolerated treatment well   Behavior During Therapy [facility] for tasks assessed/performed      Past Medical History  Diagnosis Date  . Obesity   . Hyperlipidemia   . Diabetes   . HTN (hypertension)   . GERD (gastroesophageal reflux disease)   . CKD (chronic kidney disease)   . Patellar tendinitis   . LEG PAIN, LEFT   . Arthritis   . Varicose veins   . Varicose vein     bilat   . Bronchitis     hx of   . Shaking     hands  . Shingles     hx of   . History of chicken pox   . Measles     hx of   . Mumps     hx of     Past Surgical History  Procedure Laterality Date  . Tonsillectomy    . Lasik    . Knee surgery  2005    right  . Colonoscopy    . Abdominal hysterectomy  1997  . Excision haglund's deformity with achilles tendon repair Left 05/06/2013    Procedure: LEFT ACHILLES DEBRIDEMENT AND RESECTION / HAGLUND'S EXCISION;  Surgeon: Wylene Simmer, MD;  Location: [place];  Service: Orthopedics;  Laterality: Left;  . Gastrocnemius recession Left 05/06/2013    Procedure: LEFT GASTROC RECESSION;  Surgeon: Wylene Simmer, MD;  Location: [place];  Service: Orthopedics;  Laterality: Left;  . Carpal tunnel release Left 04/07/2014    Procedure: LEFT CARPAL TUNNEL RELEASE;  Surgeon: Daryll Brod, MD;  Location: [place];  Service: Orthopedics;  Laterality: Left;  . Varicose vein surgery    .  stab  plebectomy      03/21/2014   . Total knee arthroplasty Right 11/28/2014    Procedure: TOTAL RIGHT KNEE ARTHROPLASTY;  Surgeon: Gaynelle Arabian, MD;  [facility];  Service: Orthopedics;  Laterality: Right;    There were no vitals filed for this visit.  Visit Diagnosis:  Knee stiffness, right  Knee pain, acute, right  Swelling of joint of lower leg  Weakness of right leg  Abnormality of gait      Subjective Assessment - 12/30/14 1019    Subjective Had a real achy/painful day yesterday. Possibly weather related? Today better.   Currently in Pain? Yes   Pain Score 3    Pain Location Knee   Pain Orientation Right   Pain Descriptors / Indicators Sore  Stiif   Aggravating Factors  Not sure yesterday   Pain Relieving Factors ice   Multiple Pain Sites No            [facility] PT Assessment - 12/30/14 0001    AROM   Right Knee Flexion 98                     [facility] Adult PT Treatment/Exercise - 12/30/14  0001    Knee/Hip Exercises: Aerobic   Stationary Bike rocking x 6 min   Nustep L3 x 10 min   Knee/Hip Exercises: Machines for Strengthening   Total Gym Leg Press St #7 60# 3x 10    Knee/Hip Exercises: Standing   Lateral Step Up Right;1 set;10 reps;Hand Hold: 2;Step Height: 6"   Forward Step Up Right;2 sets;10 reps;Hand Hold: 2;Step Height: 6"   Rocker Board 3 minutes   Rebounder 3 directions 81min x 3  with both UE support   Knee/Hip Exercises: Seated   Long Merchant navy officer   [employer] Weight 2 lbs.   Vasopneumatic   Number Minutes Vasopneumatic  15 minutes   Vasopnuematic Location  Knee   Vasopneumatic Pressure Medium   Vasopneumatic Temperature  3 snowflakes   Manual Therapy   Manual Therapy --  Quad and proximal scar                  PT Short Term Goals - 12/26/14 1306    PT SHORT TERM GOAL #1   Title be independent in initial HEP   Time 4   Period Weeks   Status New   PT SHORT TERM GOAL #2   Title demonstrate  Rt knee AROM extension to lacking < or = to 10 degrees to normalize gait pattern   Time 4   Period Weeks   Status On-going  20 degress   PT SHORT TERM GOAL #3   Title increase Rt LE strength to stand and walk for 20 minutes without limitation   Time 4   Period Weeks   Status Achieved  30 min   PT SHORT TERM GOAL #4   Title ascend steps with step-over-step gait pattern   Time 4   Period Weeks   Status On-going           PT Long Term Goals - 12/20/14 0931    PT LONG TERM GOAL #1   Title be independent in advanced HEP   Time 8   Period Weeks   Status New   PT LONG TERM GOAL #2   Title reduce FOTO to < or = to 45% limitation   Time 8   Period Weeks   Status New   PT LONG TERM GOAL #3   Title wean from cane > or = to 50% of the time and demonstrate symmetry on level surface   Time 8   Period Weeks   Status New   PT LONG TERM GOAL #4   Title report < or = to 3/10 Rt knee pain with ambulating in the community    Time 8   Period Weeks   Status New   PT LONG TERM GOAL #5   Title ascend and descend steps with step-over-step gait > 60% of the time   Time 8   Period Weeks   Status New   Additional Long Term Goals   Additional Long Term Goals Yes   PT LONG TERM GOAL #6   Title demonstrate 4+/5 Rt hip and knee strength to improve endurance for community ambulation and return to work   Time 8   Period Weeks   Status New   PT LONG TERM GOAL #7   Title demonstrate Rt knee flexion to > or = to 110 degrees to allow for squatting and steps without limitation   Time 8   Period Weeks   Status New  Plan - 12/30/14 1102    Clinical Impression Statement Had some extra pain mid week whcih made doing her HEP difficulty. Continue to do well with her exercises, knee flexion continues to improve slowly.   Pt will benefit from skilled therapeutic intervention in order to improve on the following deficits Abnormal gait;Decreased range of motion;Difficulty  walking;Pain;Increased edema;Decreased strength;Decreased scar mobility;Decreased activity tolerance;Decreased endurance   Rehab Potential Excellent   PT Frequency 3x / week   PT Duration 8 weeks   PT Treatment/Interventions ADLs/Self Care Home Management;Cryotherapy;Electrical Stimulation;Moist Heat;Therapeutic exercise;Therapeutic activities;Functional mobility training;Stair training;Gait training;Ultrasound;Neuromuscular re-education;Patient/family education;Manual techniques;Vasopneumatic Device;Taping;Passive range of motion;Scar mobilization   PT Next Visit Plan Continue Rt knee ROM, hip and knee strength, gait training, stair training   Consulted and Agree with Plan of Care Patient        Problem List Patient Active Problem List   Diagnosis Date Noted  . Chest pain 12/08/2014  . OA (osteoarthritis) of knee 11/28/2014  . Varicose veins of leg with complications 82/80/0349  . Varicose veins of lower extremities with other complications 17/91/5056  . Hyperlipidemia   . Diabetes   . HTN (hypertension)   . GERD (gastroesophageal reflux disease)   . CKD (chronic kidney disease)   . PATELLAR TENDINITIS 07/13/2009  . LEG PAIN, LEFT 07/13/2009    COCHRAN,JENNIFER, PTA 12/30/2014, 11:04 AM  [facility] [address] [place], [place], [address] Phone: [phone]   Fax:  [phone]

## 2015-01-02 ENCOUNTER — Ambulatory Visit: Payer: 59 | Admitting: Physical Therapy

## 2015-01-02 ENCOUNTER — Encounter: Payer: Self-pay | Admitting: Physical Therapy

## 2015-01-02 DIAGNOSIS — R269 Unspecified abnormalities of gait and mobility: Secondary | ICD-10-CM

## 2015-01-02 DIAGNOSIS — M25469 Effusion, unspecified knee: Secondary | ICD-10-CM

## 2015-01-02 DIAGNOSIS — M25661 Stiffness of right knee, not elsewhere classified: Secondary | ICD-10-CM

## 2015-01-02 DIAGNOSIS — M25561 Pain in right knee: Secondary | ICD-10-CM

## 2015-01-02 DIAGNOSIS — R29898 Other symptoms and signs involving the musculoskeletal system: Secondary | ICD-10-CM

## 2015-01-02 NOTE — Therapy (Signed)
[facility] [address], [place], [place], [address] Phone: [phone]   Fax:  [phone]  Physical Therapy Treatment  Patient Details  Name: Jessica Bowers MRN: [id number] Date of Birth: 14-Oct-1950 Referring Provider:  Gaynelle Arabian, MD  Encounter Date: 01/02/2015      PT End of Session - 01/02/15 1313    Visit Number 6   Date for PT Re-Evaluation 02/14/15   PT Start Time 1230   PT Stop Time 1330   PT Time Calculation (min) 60 min   Activity Tolerance Patient tolerated treatment well   Behavior During Therapy [facility] for tasks assessed/performed      Past Medical History  Diagnosis Date  . Obesity   . Hyperlipidemia   . Diabetes   . HTN (hypertension)   . GERD (gastroesophageal reflux disease)   . CKD (chronic kidney disease)   . Patellar tendinitis   . LEG PAIN, LEFT   . Arthritis   . Varicose veins   . Varicose vein     bilat   . Bronchitis     hx of   . Shaking     hands  . Shingles     hx of   . History of chicken pox   . Measles     hx of   . Mumps     hx of     Past Surgical History  Procedure Laterality Date  . Tonsillectomy    . Lasik    . Knee surgery  2005    right  . Colonoscopy    . Abdominal hysterectomy  1997  . Excision haglund's deformity with achilles tendon repair Left 05/06/2013    Procedure: LEFT ACHILLES DEBRIDEMENT AND RESECTION / HAGLUND'S EXCISION;  Surgeon: Wylene Simmer, MD;  Location: [place];  Service: Orthopedics;  Laterality: Left;  . Gastrocnemius recession Left 05/06/2013    Procedure: LEFT GASTROC RECESSION;  Surgeon: Wylene Simmer, MD;  Location: [place];  Service: Orthopedics;  Laterality: Left;  . Carpal tunnel release Left 04/07/2014    Procedure: LEFT CARPAL TUNNEL RELEASE;  Surgeon: Daryll Brod, MD;  Location: [place];  Service: Orthopedics;  Laterality: Left;  . Varicose vein surgery    .  stab  plebectomy      03/21/2014   . Total knee arthroplasty Right 11/28/2014    Procedure: TOTAL RIGHT KNEE ARTHROPLASTY;  Surgeon: Gaynelle Arabian, MD;  [facility];  Service: Orthopedics;  Laterality: Right;    There were no vitals filed for this visit.  Visit Diagnosis:  Knee stiffness, right  Weakness of right leg  Knee pain, acute, right  Swelling of joint of lower leg  Abnormality of gait      Subjective Assessment - 01/02/15 1231    Subjective Better over the weekend. Last night had to take half a pain pill to sleep.    Pain Score 3    Pain Location Knee   Pain Orientation Right   Pain Descriptors / Indicators Sore   Aggravating Factors  MAybe weather she thinks?   Pain Relieving Factors Ice, meds   Multiple Pain Sites No            [facility] PT Assessment - 01/02/15 0001    AROM   Right Knee Extension 12   Right Knee Flexion 100                     [facility]  Adult PT Treatment/Exercise - 01/02/15 0001    Knee/Hip Exercises: Stretches   Active Hamstring Stretch Right;2 reps   Other Knee/Hip Stretches ITband with strap in supine 2x 20 sec   Knee/Hip Exercises: Aerobic   Stationary Bike rocking x 6 min   Nustep L4 x 10 min   Knee/Hip Exercises: Machines for Strengthening   Total Gym Leg Press St #7 70# 2x10 bil, Single leg 25#  2x10    Knee/Hip Exercises: Standing   Lateral Step Up Right;1 set;20 reps;Hand Hold: 2;Step Height: 6"   Forward Step Up Right;2 sets;10 reps;Hand Hold: 2;Step Height: 6"   Rocker Board 3 minutes   Rebounder 3 directions 66min x 3  with both UE support   Knee/Hip Exercises: Seated   Long [place] engineer Strengthening;Right;3 sets   [employer] Weight 2 lbs.   Vasopneumatic   Number Minutes Vasopneumatic  15 minutes   Vasopnuematic Location  Knee   Vasopneumatic Pressure Medium   Vasopneumatic Temperature  3 snowflakes                  PT Short Term Goals - 01/02/15 1258    PT SHORT TERM GOAL #1   Title be  independent in initial HEP   Time 4   Period Weeks   Status Achieved   PT SHORT TERM GOAL #3   Title increase Rt LE strength to stand and walk for 20 minutes without limitation   Period Weeks   Status Achieved   PT SHORT TERM GOAL #4   Title ascend steps with step-over-step gait pattern   Time 4   Period Weeks   Status On-going  Progressing towards step over step normally. Can do it, but very slowly with rails.            PT Long Term Goals - 12/20/14 0931    PT LONG TERM GOAL #1   Title be independent in advanced HEP   Time 8   Period Weeks   Status New   PT LONG TERM GOAL #2   Title reduce FOTO to < or = to 45% limitation   Time 8   Period Weeks   Status New   PT LONG TERM GOAL #3   Title wean from cane > or = to 50% of the time and demonstrate symmetry on level surface   Time 8   Period Weeks   Status New   PT LONG TERM GOAL #4   Title report < or = to 3/10 Rt knee pain with ambulating in the community    Time 8   Period Weeks   Status New   PT LONG TERM GOAL #5   Title ascend and descend steps with step-over-step gait > 60% of the time   Time 8   Period Weeks   Status New   Additional Long Term Goals   Additional Long Term Goals Yes   PT LONG TERM GOAL #6   Title demonstrate 4+/5 Rt hip and knee strength to improve endurance for community ambulation and return to work   Time 8   Period Weeks   Status New   PT LONG TERM GOAL #7   Title demonstrate Rt knee flexion to > or = to 110 degrees to allow for squatting and steps without limitation   Time 8   Period Weeks   Status New               Plan - 01/02/15 1314  Clinical Impression Statement AROM continues to improve. Pt does not need to use her can in her home now, weaning from community distances. Sleeping can be difficult finding a comfortable position. Pt continues to tolerate more exercises and resistance without increasingher pain.    Pt will benefit from skilled therapeutic intervention  in order to improve on the following deficits Abnormal gait;Decreased range of motion;Difficulty walking;Pain;Increased edema;Decreased strength;Decreased scar mobility;Decreased activity tolerance;Decreased endurance   Rehab Potential Excellent   PT Frequency 3x / week   PT Duration 8 weeks   PT Treatment/Interventions ADLs/Self Care Home Management;Cryotherapy;Electrical Stimulation;Moist Heat;Therapeutic exercise;Therapeutic activities;Functional mobility training;Stair training;Gait training;Ultrasound;Neuromuscular re-education;Patient/family education;Manual techniques;Vasopneumatic Device;Taping;Passive range of motion;Scar mobilization   PT Next Visit Plan Continue Rt knee ROM, hip and knee strength, gait training, stair training   Consulted and Agree with Plan of Care Patient        Problem List Patient Active Problem List   Diagnosis Date Noted  . Chest pain 12/08/2014  . OA (osteoarthritis) of knee 11/28/2014  . Varicose veins of leg with complications 83/66/2947  . Varicose veins of lower extremities with other complications 65/46/5035  . Hyperlipidemia   . Diabetes   . HTN (hypertension)   . GERD (gastroesophageal reflux disease)   . CKD (chronic kidney disease)   . PATELLAR TENDINITIS 07/13/2009  . LEG PAIN, LEFT 07/13/2009    COCHRAN,JENNIFER , PTA 01/02/2015, 1:18 PM  [facility] [address], [place], [place], [address] Phone: [phone]   Fax:  [phone]

## 2015-01-04 ENCOUNTER — Encounter: Payer: Self-pay | Admitting: Physical Therapy

## 2015-01-04 ENCOUNTER — Ambulatory Visit: Payer: 59 | Admitting: Physical Therapy

## 2015-01-04 DIAGNOSIS — M25469 Effusion, unspecified knee: Secondary | ICD-10-CM

## 2015-01-04 DIAGNOSIS — R29898 Other symptoms and signs involving the musculoskeletal system: Secondary | ICD-10-CM

## 2015-01-04 DIAGNOSIS — M25561 Pain in right knee: Secondary | ICD-10-CM

## 2015-01-04 DIAGNOSIS — M25661 Stiffness of right knee, not elsewhere classified: Secondary | ICD-10-CM | POA: Diagnosis not present

## 2015-01-04 NOTE — Therapy (Signed)
[facility] [address] [place], [place], [address] Phone: [phone]   Fax:  [phone]  Physical Therapy Treatment  Patient Details  Name: Jessica Bowers MRN: [id number] Date of Birth: 12-08-1950 Referring Provider:  Gaynelle Arabian, MD  Encounter Date: 01/04/2015      PT End of Session - 01/04/15 1420    Visit Number 7   Date for PT Re-Evaluation 02/14/15   PT Start Time 3419   PT Stop Time 1503   PT Time Calculation (min) 60 min   Activity Tolerance Patient tolerated treatment well   Behavior During Therapy [facility] for tasks assessed/performed      Past Medical History  Diagnosis Date  . Obesity   . Hyperlipidemia   . Diabetes   . HTN (hypertension)   . GERD (gastroesophageal reflux disease)   . CKD (chronic kidney disease)   . Patellar tendinitis   . LEG PAIN, LEFT   . Arthritis   . Varicose veins   . Varicose vein     bilat   . Bronchitis     hx of   . Shaking     hands  . Shingles     hx of   . History of chicken pox   . Measles     hx of   . Mumps     hx of     Past Surgical History  Procedure Laterality Date  . Tonsillectomy    . Lasik    . Knee surgery  2005    right  . Colonoscopy    . Abdominal hysterectomy  1997  . Excision haglund's deformity with achilles tendon repair Left 05/06/2013    Procedure: LEFT ACHILLES DEBRIDEMENT AND RESECTION / HAGLUND'S EXCISION;  Surgeon: Wylene Simmer, MD;  Location: [place];  Service: Orthopedics;  Laterality: Left;  . Gastrocnemius recession Left 05/06/2013    Procedure: LEFT GASTROC RECESSION;  Surgeon: Wylene Simmer, MD;  Location: [place];  Service: Orthopedics;  Laterality: Left;  . Carpal tunnel release Left 04/07/2014    Procedure: LEFT CARPAL TUNNEL RELEASE;  Surgeon: Daryll Brod, MD;  Location: [place];  Service: Orthopedics;  Laterality: Left;  . Varicose vein surgery    .  stab  plebectomy      03/21/2014   . Total knee arthroplasty Right 11/28/2014    Procedure: TOTAL RIGHT KNEE ARTHROPLASTY;  Surgeon: Gaynelle Arabian, MD;  [facility];  Service: Orthopedics;  Laterality: Right;    There were no vitals filed for this visit.  Visit Diagnosis:  Knee stiffness, right  Weakness of right leg  Swelling of joint of lower leg  Knee pain, acute, right      Subjective Assessment - 01/04/15 1409    Subjective Saw MD, pleased, cleared to drive, keep going to PT.    Currently in Pain? No/denies   Multiple Pain Sites No                         [facility] Adult PT Treatment/Exercise - 01/04/15 0001    Knee/Hip Exercises: Aerobic   Stationary Bike Backwards revolution today  but tough, 6 min   Nustep L4 x 10 min   Knee/Hip Exercises: Machines for Strengthening   Total Gym Leg Press St # 7 75# bil 3x10, single  25# 20x   This weight was very challenging.   Knee/Hip Exercises: Standing   Lateral Step Up  Right;1 set;20 reps;Hand Hold: 2;Step Height: 6"   Forward Step Up Right;2 sets;10 reps;Hand Hold: 2;Step Height: 6"   Rocker Board 3 minutes   Rebounder 3 directions 49min x 3  with both UE support   Knee/Hip Exercises: Seated   Long Arc Quad Strengthening;Right;3 sets;10 reps   Long Arc Quad Weight 3 lbs.   Vasopneumatic   Number Minutes Vasopneumatic  15 minutes   Vasopnuematic Location  Knee   Vasopneumatic Pressure Medium   Vasopneumatic Temperature  3 snowflakes                  PT Short Term Goals - 01/02/15 1258    PT SHORT TERM GOAL #1   Title be independent in initial HEP   Time 4   Period Weeks   Status Achieved   PT SHORT TERM GOAL #3   Title increase Rt LE strength to stand and walk for 20 minutes without limitation   Period Weeks   Status Achieved   PT SHORT TERM GOAL #4   Title ascend steps with step-over-step gait pattern   Time 4   Period Weeks   Status On-going  Progressing towards step over step normally.  Can do it, but very slowly with rails.            PT Long Term Goals - 12/20/14 0931    PT LONG TERM GOAL #1   Title be independent in advanced HEP   Time 8   Period Weeks   Status New   PT LONG TERM GOAL #2   Title reduce FOTO to < or = to 45% limitation   Time 8   Period Weeks   Status New   PT LONG TERM GOAL #3   Title wean from cane > or = to 50% of the time and demonstrate symmetry on level surface   Time 8   Period Weeks   Status New   PT LONG TERM GOAL #4   Title report < or = to 3/10 Rt knee pain with ambulating in the community    Time 8   Period Weeks   Status New   PT LONG TERM GOAL #5   Title ascend and descend steps with step-over-step gait > 60% of the time   Time 8   Period Weeks   Status New   Additional Long Term Goals   Additional Long Term Goals Yes   PT LONG TERM GOAL #6   Title demonstrate 4+/5 Rt hip and knee strength to improve endurance for community ambulation and return to work   Time 8   Period Weeks   Status New   PT LONG TERM GOAL #7   Title demonstrate Rt knee flexion to > or = to 110 degrees to allow for squatting and steps without limitation   Time 8   Period Weeks   Status New               Plan - 01/04/15 1431    Clinical Impression Statement Pt saw MD with good report. Continue to work on her ROM and strength. She is now driving independently. Overall soreness continues to decrease.   Pt will benefit from skilled therapeutic intervention in order to improve on the following deficits Abnormal gait;Decreased range of motion;Difficulty walking;Pain;Increased edema;Decreased strength;Decreased scar mobility;Decreased activity tolerance;Decreased endurance   Rehab Potential Excellent   PT Frequency 3x / week   PT Duration 8 weeks   PT Treatment/Interventions ADLs/Self Care Home Management;Cryotherapy;Electrical Stimulation;Moist  Heat;Therapeutic exercise;Therapeutic activities;Functional mobility training;Stair training;Gait  training;Ultrasound;Neuromuscular re-education;Patient/family education;Manual techniques;Vasopneumatic Device;Taping;Passive range of motion;Scar mobilization   PT Next Visit Plan Continue Rt knee ROM, hip and knee strength, gait training, stair training   Consulted and Agree with Plan of Care Patient        Problem List Patient Active Problem List   Diagnosis Date Noted  . Chest pain 12/08/2014  . OA (osteoarthritis) of knee 11/28/2014  . Varicose veins of leg with complications 18/56/3149  . Varicose veins of lower extremities with other complications 70/26/3785  . Hyperlipidemia   . Diabetes   . HTN (hypertension)   . GERD (gastroesophageal reflux disease)   . CKD (chronic kidney disease)   . PATELLAR TENDINITIS 07/13/2009  . LEG PAIN, LEFT 07/13/2009    COCHRAN,JENNIFER, PTA 01/04/2015, 2:34 PM  [place] [facility] [address], [place], [place], [address] Phone: [phone]   Fax:  [phone]

## 2015-01-06 ENCOUNTER — Encounter: Payer: Self-pay | Admitting: Physical Therapy

## 2015-01-06 ENCOUNTER — Ambulatory Visit: Payer: 59 | Admitting: Physical Therapy

## 2015-01-06 DIAGNOSIS — M25469 Effusion, unspecified knee: Secondary | ICD-10-CM

## 2015-01-06 DIAGNOSIS — M25661 Stiffness of right knee, not elsewhere classified: Secondary | ICD-10-CM

## 2015-01-06 DIAGNOSIS — R269 Unspecified abnormalities of gait and mobility: Secondary | ICD-10-CM

## 2015-01-06 DIAGNOSIS — M25561 Pain in right knee: Secondary | ICD-10-CM

## 2015-01-06 DIAGNOSIS — R29898 Other symptoms and signs involving the musculoskeletal system: Secondary | ICD-10-CM

## 2015-01-06 NOTE — Therapy (Signed)
[facility] [address] [place], [place], [address] Phone: [phone]   Fax:  [phone]  Physical Therapy Treatment  Patient Details  Name: Jessica Bowers MRN: [id number] Date of Birth: [birth year] Referring Provider:  Gaynelle Arabian, MD  Encounter Date: 01/06/2015      PT End of Session - 01/06/15 1059    Visit Number 8   Date for PT Re-Evaluation 02/14/15   PT Start Time 6967   PT Stop Time 1115   PT Time Calculation (min) 60 min   Activity Tolerance Patient tolerated treatment well   Behavior During Therapy [facility] for tasks assessed/performed      Past Medical History  Diagnosis Date  . Obesity   . Hyperlipidemia   . Diabetes   . HTN (hypertension)   . GERD (gastroesophageal reflux disease)   . CKD (chronic kidney disease)   . Patellar tendinitis   . LEG PAIN, LEFT   . Arthritis   . Varicose veins   . Varicose vein     bilat   . Bronchitis     hx of   . Shaking     hands  . Shingles     hx of   . History of chicken pox   . Measles     hx of   . Mumps     hx of     Past Surgical History  Procedure Laterality Date  . Tonsillectomy    . Lasik    . Knee surgery  2005    right  . Colonoscopy    . Abdominal hysterectomy  1997  . Excision haglund's deformity with achilles tendon repair Left 05/06/2013    Procedure: LEFT ACHILLES DEBRIDEMENT AND RESECTION / HAGLUND'S EXCISION;  Surgeon: Wylene Simmer, MD;  Location: [place];  Service: Orthopedics;  Laterality: Left;  . Gastrocnemius recession Left 05/06/2013    Procedure: LEFT GASTROC RECESSION;  Surgeon: Wylene Simmer, MD;  Location: [place];  Service: Orthopedics;  Laterality: Left;  . Carpal tunnel release Left 04/07/2014    Procedure: LEFT CARPAL TUNNEL RELEASE;  Surgeon: Daryll Brod, MD;  Location: [place];  Service: Orthopedics;  Laterality: Left;  . Varicose vein surgery    .  stab  plebectomy      03/21/2014   . Total knee arthroplasty Right 11/28/2014    Procedure: TOTAL RIGHT KNEE ARTHROPLASTY;  Surgeon: Gaynelle Arabian, MD;  [facility];  Service: Orthopedics;  Laterality: Right;    There were no vitals filed for this visit.  Visit Diagnosis:  Knee stiffness, right  Weakness of right leg  Swelling of joint of lower leg  Knee pain, acute, right  Abnormality of gait      Subjective Assessment - 01/06/15 1016    Subjective Knee was really achy last night and into this AM.    Currently in Pain? Yes   Pain Score 4    Pain Location Knee   Pain Orientation Right   Pain Descriptors / Indicators Aching;Discomfort            [facility] PT Assessment - 01/06/15 0001    AROM   Right Knee Flexion 100                     [facility] Adult PT Treatment/Exercise - 01/06/15 0001    Knee/Hip Exercises: Aerobic   Stationary Bike Rocking with some backwards revolution x 7 min   Nustep  L5 x 10 min   Knee/Hip Exercises: Machines for Strengthening   Total Gym Leg Press St 7 Bil 75# 2x10, single  30# 2 x10   Knee/Hip Exercises: Standing   Lateral Step Up Right;1 set;20 reps;Hand Hold: 2;Step Height: 6"   Forward Step Up Right;2 sets;10 reps;Hand Hold: 2;Step Height: 6"   Rocker Board 3 minutes   Rebounder 3 directions 18min x 3  with both UE support   Knee/Hip Exercises: Seated   Long Arc Quad Strengthening;Right;3 sets;10 reps   Long Arc Quad Weight 3 lbs.   Vasopneumatic   Number Minutes Vasopneumatic  15 minutes   Vasopnuematic Location  Knee   Vasopneumatic Pressure Medium   Vasopneumatic Temperature  3 snowflakes   Manual Therapy   Manual Therapy --  Retrograde massage, quad soft tissue release                  PT Short Term Goals - 01/02/15 1258    PT SHORT TERM GOAL #1   Title be independent in initial HEP   Time 4   Period Weeks   Status Achieved   PT SHORT TERM GOAL #3   Title increase Rt LE strength to stand and walk for  20 minutes without limitation   Period Weeks   Status Achieved   PT SHORT TERM GOAL #4   Title ascend steps with step-over-step gait pattern   Time 4   Period Weeks   Status On-going  Progressing towards step over step normally. Can do it, but very slowly with rails.            PT Long Term Goals - 12/20/14 0931    PT LONG TERM GOAL #1   Title be independent in advanced HEP   Time 8   Period Weeks   Status New   PT LONG TERM GOAL #2   Title reduce FOTO to < or = to 45% limitation   Time 8   Period Weeks   Status New   PT LONG TERM GOAL #3   Title wean from cane > or = to 50% of the time and demonstrate symmetry on level surface   Time 8   Period Weeks   Status New   PT LONG TERM GOAL #4   Title report < or = to 3/10 Rt knee pain with ambulating in the community    Time 8   Period Weeks   Status New   PT LONG TERM GOAL #5   Title ascend and descend steps with step-over-step gait > 60% of the time   Time 8   Period Weeks   Status New   Additional Long Term Goals   Additional Long Term Goals Yes   PT LONG TERM GOAL #6   Title demonstrate 4+/5 Rt hip and knee strength to improve endurance for community ambulation and return to work   Time 8   Period Weeks   Status New   PT LONG TERM GOAL #7   Title demonstrate Rt knee flexion to > or = to 110 degrees to allow for squatting and steps without limitation   Time 8   Period Weeks   Status New               Plan - 01/06/15 1100    Clinical Impression Statement Week ends with knee stiffness and soreness which pt believes is weather related (rain.) She maintained her knee flexion throughout.   Pt will benefit from skilled therapeutic  intervention in order to improve on the following deficits Abnormal gait;Decreased range of motion;Difficulty walking;Pain;Increased edema;Decreased strength;Decreased scar mobility;Decreased activity tolerance;Decreased endurance   Rehab Potential Excellent   PT Frequency 3x / week    PT Duration 8 weeks   PT Treatment/Interventions ADLs/Self Care Home Management;Cryotherapy;Electrical Stimulation;Moist Heat;Therapeutic exercise;Therapeutic activities;Functional mobility training;Stair training;Gait training;Ultrasound;Neuromuscular re-education;Patient/family education;Manual techniques;Vasopneumatic Device;Taping;Passive range of motion;Scar mobilization   PT Next Visit Plan Continue Rt knee ROM, hip and knee strength, gait training, stair training   Consulted and Agree with Plan of Care Patient        Problem List Patient Active Problem List   Diagnosis Date Noted  . Chest pain 12/08/2014  . OA (osteoarthritis) of knee 11/28/2014  . Varicose veins of leg with complications 90/24/0973  . Varicose veins of lower extremities with other complications 53/29/9242  . Hyperlipidemia   . Diabetes   . HTN (hypertension)   . GERD (gastroesophageal reflux disease)   . CKD (chronic kidney disease)   . PATELLAR TENDINITIS 07/13/2009  . LEG PAIN, LEFT 07/13/2009    COCHRAN,JENNIFER, PTA 01/06/2015, 11:03 AM  [facility] [address] [place] , [place], [address] Phone: [phone]   Fax:  [phone]

## 2015-01-09 ENCOUNTER — Ambulatory Visit: Payer: 59 | Attending: Orthopedic Surgery | Admitting: Physical Therapy

## 2015-01-09 DIAGNOSIS — M2548 Effusion, other site: Secondary | ICD-10-CM | POA: Insufficient documentation

## 2015-01-09 DIAGNOSIS — M25561 Pain in right knee: Secondary | ICD-10-CM | POA: Insufficient documentation

## 2015-01-09 DIAGNOSIS — M25469 Effusion, unspecified knee: Secondary | ICD-10-CM

## 2015-01-09 DIAGNOSIS — R29898 Other symptoms and signs involving the musculoskeletal system: Secondary | ICD-10-CM | POA: Insufficient documentation

## 2015-01-09 DIAGNOSIS — M25661 Stiffness of right knee, not elsewhere classified: Secondary | ICD-10-CM | POA: Diagnosis not present

## 2015-01-09 DIAGNOSIS — R269 Unspecified abnormalities of gait and mobility: Secondary | ICD-10-CM | POA: Insufficient documentation

## 2015-01-09 NOTE — Therapy (Signed)
[facility] [address], [place], [place], [address] Phone: [phone]   Fax:  [phone]  Physical Therapy Treatment  Patient Details  Name: Jessica Bowers MRN: [id number] Date of Birth: 1950-04-22 Referring Provider:  Gaynelle Arabian, MD  Encounter Date: 01/09/2015      PT End of Session - 01/09/15 1256    Visit Number 9   Date for PT Re-Evaluation 02/14/15   PT Start Time 1228   PT Stop Time 1328   PT Time Calculation (min) 60 min   Activity Tolerance Patient tolerated treatment well   Behavior During Therapy [facility] for tasks assessed/performed      Past Medical History  Diagnosis Date  . Obesity   . Hyperlipidemia   . Diabetes   . HTN (hypertension)   . GERD (gastroesophageal reflux disease)   . CKD (chronic kidney disease)   . Patellar tendinitis   . LEG PAIN, LEFT   . Arthritis   . Varicose veins   . Varicose vein     bilat   . Bronchitis     hx of   . Shaking     hands  . Shingles     hx of   . History of chicken pox   . Measles     hx of   . Mumps     hx of     Past Surgical History  Procedure Laterality Date  . Tonsillectomy    . Lasik    . Knee surgery  2005    right  . Colonoscopy    . Abdominal hysterectomy  1997  . Excision haglund's deformity with achilles tendon repair Left 05/06/2013    Procedure: LEFT ACHILLES DEBRIDEMENT AND RESECTION / HAGLUND'S EXCISION;  Surgeon: Wylene Simmer, MD;  Location: [place];  Service: Orthopedics;  Laterality: Left;  . Gastrocnemius recession Left 05/06/2013    Procedure: LEFT GASTROC RECESSION;  Surgeon: Wylene Simmer, MD;  Location: [place];  Service: Orthopedics;  Laterality: Left;  . Carpal tunnel release Left 04/07/2014    Procedure: LEFT CARPAL TUNNEL RELEASE;  Surgeon: Daryll Brod, MD;  Location: [place];  Service: Orthopedics;  Laterality: Left;  . Varicose vein surgery    .  stab  plebectomy      03/21/2014   . Total knee arthroplasty Right 11/28/2014    Procedure: TOTAL RIGHT KNEE ARTHROPLASTY;  Surgeon: Gaynelle Arabian, MD;  [facility];  Service: Orthopedics;  Laterality: Right;    There were no vitals filed for this visit.  Visit Diagnosis:  Knee stiffness, right  Swelling of joint of lower leg  Weakness of right leg  Knee pain, acute, right      Subjective Assessment - 01/09/15 1233    Subjective KNee doing better with the sun! Had some ache last night but feels good this am.    Currently in Pain? No/denies   Pain Location Ankle   Multiple Pain Sites No                         [facility] Adult PT Treatment/Exercise - 01/09/15 0001    Ambulation/Gait   Stairs --  up and down with 2 rails 3x, step over step both directions    Stairs Assistance --  Descending is slower and more cautious   Knee/Hip Exercises: Aerobic   Stationary Bike Rocking initially then attempted backwards but could not do x7 min  Nustep L5x    Knee/Hip Exercises: Machines for Strengthening   Total Gym Leg Press St 7 Bil 75# 2x10, single  30# 2 x10   Knee/Hip Exercises: Standing   Lateral Step Up Right;1 set;20 reps;Hand Hold: 2;Step Height: 6"   Forward Step Up Right;2 sets;10 reps;Hand Hold: 2;Step Height: 6"   Step Down Right;1 set;10 reps;Hand Hold: 2;Step Height: 6"   Rocker Board 3 minutes   Rebounder 3 directions 52min x 3  with both UE support   Knee/Hip Exercises: Seated   Long Arc Quad Strengthening;Right;3 sets;10 reps   Long Arc Quad Weight 4 lbs.   Vasopneumatic   Number Minutes Vasopneumatic  15 minutes   Vasopnuematic Location  Knee   Vasopneumatic Pressure Medium   Vasopneumatic Temperature  3 snowflakes                  PT Short Term Goals - 01/09/15 1305    PT SHORT TERM GOAL #2   Title demonstrate Rt knee AROM extension to lacking < or = to 10 degrees to normalize gait pattern   Time 4   Period Weeks   Status On-going   14 active           PT Long Term Goals - 12/20/14 0931    PT LONG TERM GOAL #1   Title be independent in advanced HEP   Time 8   Period Weeks   Status New   PT LONG TERM GOAL #2   Title reduce FOTO to < or = to 45% limitation   Time 8   Period Weeks   Status New   PT LONG TERM GOAL #3   Title wean from cane > or = to 50% of the time and demonstrate symmetry on level surface   Time 8   Period Weeks   Status New   PT LONG TERM GOAL #4   Title report < or = to 3/10 Rt knee pain with ambulating in the community    Time 8   Period Weeks   Status New   PT LONG TERM GOAL #5   Title ascend and descend steps with step-over-step gait > 60% of the time   Time 8   Period Weeks   Status New   Additional Long Term Goals   Additional Long Term Goals Yes   PT LONG TERM GOAL #6   Title demonstrate 4+/5 Rt hip and knee strength to improve endurance for community ambulation and return to work   Time 8   Period Weeks   Status New   PT LONG TERM GOAL #7   Title demonstrate Rt knee flexion to > or = to 110 degrees to allow for squatting and steps without limitation   Time 8   Period Weeks   Status New               Plan - 01/09/15 1301    Clinical Impression Statement tolerated increased wt with LAQ exercise today. KNee extension improving   Pt will benefit from skilled therapeutic intervention in order to improve on the following deficits Abnormal gait;Decreased range of motion;Difficulty walking;Pain;Increased edema;Decreased strength;Decreased scar mobility;Decreased activity tolerance;Decreased endurance   Rehab Potential Excellent   PT Frequency 3x / week   PT Duration 8 weeks   PT Treatment/Interventions ADLs/Self Care Home Management;Cryotherapy;Electrical Stimulation;Moist Heat;Therapeutic exercise;Therapeutic activities;Functional mobility training;Stair training;Gait training;Ultrasound;Neuromuscular re-education;Patient/family education;Manual  techniques;Vasopneumatic Device;Taping;Passive range of motion;Scar mobilization   PT Next Visit Plan Continue Rt knee ROM, hip  and knee strength, gait training, stair training, try resisted walking        Problem List Patient Active Problem List   Diagnosis Date Noted  . Chest pain 12/08/2014  . OA (osteoarthritis) of knee 11/28/2014  . Varicose veins of leg with complications 15/61/5379  . Varicose veins of lower extremities with other complications 43/27/6147  . Hyperlipidemia   . Diabetes ([place])   . HTN (hypertension)   . GERD (gastroesophageal reflux disease)   . CKD (chronic kidney disease)   . PATELLAR TENDINITIS 07/13/2009  . LEG PAIN, LEFT 07/13/2009    Beckett Maden, PTA 01/09/2015, 1:18 PM  [place] [facility] [address] [place], [place], [address] Phone: [phone]   Fax:  [phone]

## 2015-01-11 ENCOUNTER — Encounter: Payer: Self-pay | Admitting: Physical Therapy

## 2015-01-11 ENCOUNTER — Ambulatory Visit: Payer: 59 | Admitting: Physical Therapy

## 2015-01-11 DIAGNOSIS — M25661 Stiffness of right knee, not elsewhere classified: Secondary | ICD-10-CM | POA: Diagnosis not present

## 2015-01-11 DIAGNOSIS — M25561 Pain in right knee: Secondary | ICD-10-CM

## 2015-01-11 DIAGNOSIS — R29898 Other symptoms and signs involving the musculoskeletal system: Secondary | ICD-10-CM

## 2015-01-11 DIAGNOSIS — R269 Unspecified abnormalities of gait and mobility: Secondary | ICD-10-CM

## 2015-01-11 DIAGNOSIS — M25469 Effusion, unspecified knee: Secondary | ICD-10-CM

## 2015-01-11 NOTE — Therapy (Signed)
[facility] [address], [place], [place], [address] Phone: [phone]   Fax:  [phone]  Physical Therapy Treatment  Patient Details  Name: Jessica Bowers MRN: [id number] Date of Birth: [birth year] Referring Provider:  Gaynelle Arabian, MD  Encounter Date: 01/11/2015      PT End of Session - 01/11/15 1249    Visit Number 10   Date for PT Re-Evaluation 02/14/15   PT Start Time 1232   PT Stop Time 1330   PT Time Calculation (min) 58 min   Activity Tolerance Patient tolerated treatment well   Behavior During Therapy [facility] for tasks assessed/performed      Past Medical History  Diagnosis Date  . Obesity   . Hyperlipidemia   . Diabetes ([place])   . HTN (hypertension)   . GERD (gastroesophageal reflux disease)   . CKD (chronic kidney disease)   . Patellar tendinitis   . LEG PAIN, LEFT   . Arthritis   . Varicose veins   . Varicose vein     bilat   . Bronchitis     hx of   . Shaking     hands  . Shingles     hx of   . History of chicken pox   . Measles     hx of   . Mumps     hx of     Past Surgical History  Procedure Laterality Date  . Tonsillectomy    . Lasik    . Knee surgery  2005    right  . Colonoscopy    . Abdominal hysterectomy  1997  . Excision haglund's deformity with achilles tendon repair Left 05/06/2013    Procedure: LEFT ACHILLES DEBRIDEMENT AND RESECTION / HAGLUND'S EXCISION;  Surgeon: Wylene Simmer, MD;  Location: [place];  Service: Orthopedics;  Laterality: Left;  . Gastrocnemius recession Left 05/06/2013    Procedure: LEFT GASTROC RECESSION;  Surgeon: Wylene Simmer, MD;  Location: [place];  Service: Orthopedics;  Laterality: Left;  . Carpal tunnel release Left 04/07/2014    Procedure: LEFT CARPAL TUNNEL RELEASE;  Surgeon: Daryll Brod, MD;  Location: [place];  Service: Orthopedics;  Laterality: Left;  . Varicose vein surgery    .   stab plebectomy      03/21/2014   . Total knee arthroplasty Right 11/28/2014    Procedure: TOTAL RIGHT KNEE ARTHROPLASTY;  Surgeon: Gaynelle Arabian, MD;  [facility];  Service: Orthopedics;  Laterality: Right;    There were no vitals filed for this visit.  Visit Diagnosis:  Knee stiffness, right  Weakness of right leg  Knee pain, acute, right  Swelling of joint of lower leg  Abnormality of gait      Subjective Assessment - 01/11/15 1234    Subjective [place] coming in/weather system changing so my knee is aching and throbbing at night.    Currently in Pain? Yes   Pain Score 5    Pain Location Knee   Pain Orientation Right   Pain Descriptors / Indicators Aching;Dull   Aggravating Factors  weather   Pain Relieving Factors ice, meds   Multiple Pain Sites No                         [facility] Adult PT Treatment/Exercise - 01/11/15 0001    Knee/Hip Exercises: Aerobic   Nustep L5 x 10 min   Knee/Hip Exercises: Machines for Strengthening  Total Gym Leg Press St 7 Bil 75# 2x10, single  30# 2 x10   Knee/Hip Exercises: Standing   Lateral Step Up Right;1 set;20 reps;Hand Hold: 2;Step Height: 6"   Forward Step Up Right;2 sets;10 reps;Hand Hold: 2;Step Height: 6"   Step Down Right;1 set;10 reps;Hand Hold: 2;Step Height: 6"   Walking with Sports Cord 20# 10x fwd/bkwrd   Knee/Hip Exercises: Seated   Long Arc Quad Strengthening;Right;3 sets;10 reps   Long Arc Quad Weight 4 lbs.   Vasopneumatic   Number Minutes Vasopneumatic  15 minutes   Vasopnuematic Location  Knee   Vasopneumatic Pressure Medium   Vasopneumatic Temperature  3 snowflakes                  PT Short Term Goals - 01/09/15 1305    PT SHORT TERM GOAL #2   Title demonstrate Rt knee AROM extension to lacking < or = to 10 degrees to normalize gait pattern   Time 4   Period Weeks   Status On-going  14 active           PT Long Term Goals - 12/20/14 0931    PT LONG TERM GOAL #1    Title be independent in advanced HEP   Time 8   Period Weeks   Status New   PT LONG TERM GOAL #2   Title reduce FOTO to < or = to 45% limitation   Time 8   Period Weeks   Status New   PT LONG TERM GOAL #3   Title wean from cane > or = to 50% of the time and demonstrate symmetry on level surface   Time 8   Period Weeks   Status New   PT LONG TERM GOAL #4   Title report < or = to 3/10 Rt knee pain with ambulating in the community    Time 8   Period Weeks   Status New   PT LONG TERM GOAL #5   Title ascend and descend steps with step-over-step gait > 60% of the time   Time 8   Period Weeks   Status New   Additional Long Term Goals   Additional Long Term Goals Yes   PT LONG TERM GOAL #6   Title demonstrate 4+/5 Rt hip and knee strength to improve endurance for community ambulation and return to work   Time 8   Period Weeks   Status New   PT LONG TERM GOAL #7   Title demonstrate Rt knee flexion to > or = to 110 degrees to allow for squatting and steps without limitation   Time 8   Period Weeks   Status New               Plan - 01/11/15 1305    Clinical Impression Statement Very stiff and sore today. Again pt thinks the weather is a factor for the stiffness. PTA suggested to pt she go back to wearing her compressive stocking for her swelling.    Pt will benefit from skilled therapeutic intervention in order to improve on the following deficits Abnormal gait;Decreased range of motion;Difficulty walking;Pain;Increased edema;Decreased strength;Decreased scar mobility;Decreased activity tolerance;Decreased endurance   Rehab Potential Excellent   PT Frequency 3x / week   PT Treatment/Interventions ADLs/Self Care Home Management;Cryotherapy;Electrical Stimulation;Moist Heat;Therapeutic exercise;Therapeutic activities;Functional mobility training;Stair training;Gait training;Ultrasound;Neuromuscular re-education;Patient/family education;Manual techniques;Vasopneumatic  Device;Taping;Passive range of motion;Scar mobilization   PT Next Visit Plan Continue Rt knee ROM, hip and knee strength, gait training, stair  training, try resisted walking   Consulted and Agree with Plan of Care Patient        Problem List Patient Active Problem List   Diagnosis Date Noted  . Chest pain 12/08/2014  . OA (osteoarthritis) of knee 11/28/2014  . Varicose veins of leg with complications 39/03/2582  . Varicose veins of lower extremities with other complications 46/21/9471  . Hyperlipidemia   . Diabetes ([place])   . HTN (hypertension)   . GERD (gastroesophageal reflux disease)   . CKD (chronic kidney disease)   . PATELLAR TENDINITIS 07/13/2009  . LEG PAIN, LEFT 07/13/2009    Danial Hlavac, PTA 01/11/2015, 1:08 PM  [facility] [address] [place], [place], [address] Phone: [phone]   Fax:  [phone]

## 2015-01-13 ENCOUNTER — Encounter: Payer: Self-pay | Admitting: Physical Therapy

## 2015-01-13 ENCOUNTER — Ambulatory Visit: Payer: 59 | Admitting: Physical Therapy

## 2015-01-13 DIAGNOSIS — M25561 Pain in right knee: Secondary | ICD-10-CM

## 2015-01-13 DIAGNOSIS — R29898 Other symptoms and signs involving the musculoskeletal system: Secondary | ICD-10-CM

## 2015-01-13 DIAGNOSIS — M25469 Effusion, unspecified knee: Secondary | ICD-10-CM

## 2015-01-13 DIAGNOSIS — M25661 Stiffness of right knee, not elsewhere classified: Secondary | ICD-10-CM

## 2015-01-13 NOTE — Therapy (Signed)
[facility] [address], [place], [place], [address] Phone: [phone]   Fax:  [phone]  Physical Therapy Treatment  Patient Details  Name: Jessica Bowers MRN: [id number] Date of Birth: 31-Jan-1951 Referring Provider:  Gaynelle Arabian, MD  Encounter Date: 01/13/2015      PT End of Session - 01/13/15 1046    Visit Number 11   Date for PT Re-Evaluation 02/14/15   PT Start Time 1016   PT Stop Time 1116   PT Time Calculation (min) 60 min   Activity Tolerance Patient tolerated treatment well   Behavior During Therapy [facility] for tasks assessed/performed      Past Medical History  Diagnosis Date  . Obesity   . Hyperlipidemia   . Diabetes ([place])   . HTN (hypertension)   . GERD (gastroesophageal reflux disease)   . CKD (chronic kidney disease)   . Patellar tendinitis   . LEG PAIN, LEFT   . Arthritis   . Varicose veins   . Varicose vein     bilat   . Bronchitis     hx of   . Shaking     hands  . Shingles     hx of   . History of chicken pox   . Measles     hx of   . Mumps     hx of     Past Surgical History  Procedure Laterality Date  . Tonsillectomy    . Lasik    . Knee surgery  2005    right  . Colonoscopy    . Abdominal hysterectomy  1997  . Excision haglund's deformity with achilles tendon repair Left 05/06/2013    Procedure: LEFT ACHILLES DEBRIDEMENT AND RESECTION / HAGLUND'S EXCISION;  Surgeon: Wylene Simmer, MD;  Location: [place];  Service: Orthopedics;  Laterality: Left;  . Gastrocnemius recession Left 05/06/2013    Procedure: LEFT GASTROC RECESSION;  Surgeon: Wylene Simmer, MD;  Location: [place];  Service: Orthopedics;  Laterality: Left;  . Carpal tunnel release Left 04/07/2014    Procedure: LEFT CARPAL TUNNEL RELEASE;  Surgeon: Daryll Brod, MD;  Location: [place];  Service: Orthopedics;  Laterality: Left;  . Varicose vein surgery    .   stab plebectomy      03/21/2014   . Total knee arthroplasty Right 11/28/2014    Procedure: TOTAL RIGHT KNEE ARTHROPLASTY;  Surgeon: Gaynelle Arabian, MD;  [facility];  Service: Orthopedics;  Laterality: Right;    There were no vitals filed for this visit.  Visit Diagnosis:  Knee stiffness, right  Weakness of right leg  Swelling of joint of lower leg  Knee pain, acute, right      Subjective Assessment - 01/13/15 1019    Subjective Medication helping my knee not hurt so bad. Wearing compression stockings today for swelling.   Currently in Pain? Yes   Pain Score 3    Pain Location Knee   Pain Orientation Right   Pain Descriptors / Indicators Aching;Dull   Multiple Pain Sites No                         [facility] Adult PT Treatment/Exercise - 01/13/15 0001    Knee/Hip Exercises: Stretches   Active Hamstring Stretch Right;3 reps;30 seconds   Gastroc Stretch Right;3 reps;30 seconds   Other Knee/Hip Stretches IT band stretch with strap 3x30 sec   Knee/Hip Exercises: Aerobic  Nustep L5 x 10 min   Knee/Hip Exercises: Machines for Strengthening   Total Gym Leg Press St #7 75# 30x, RTLE 35# 20x   Knee/Hip Exercises: Standing   Lateral Step Up Right;1 set;20 reps;Hand Hold: 2;Step Height: 6"   Forward Step Up Right;2 sets;10 reps;Hand Hold: 2;Step Height: 6"   Step Down Right;2 sets;10 reps;Hand Hold: 2;Step Height: 6"   Rebounder 3 way wt shift 1 min each   Walking with Sports Cord 20# 10x fwd/bkwrd   Knee/Hip Exercises: Seated   Long Arc Quad Strengthening;Right;3 sets;10 reps   Long Arc Quad Weight 4 lbs.   Vasopneumatic   Number Minutes Vasopneumatic  15 minutes   Vasopnuematic Location  Knee   Vasopneumatic Pressure Medium   Vasopneumatic Temperature  3 snowflakes                  PT Short Term Goals - 01/09/15 1305    PT SHORT TERM GOAL #2   Title demonstrate Rt knee AROM extension to lacking < or = to 10 degrees to normalize gait pattern    Time 4   Period Weeks   Status On-going  14 active           PT Long Term Goals - 12/20/14 0931    PT LONG TERM GOAL #1   Title be independent in advanced HEP   Time 8   Period Weeks   Status New   PT LONG TERM GOAL #2   Title reduce FOTO to < or = to 45% limitation   Time 8   Period Weeks   Status New   PT LONG TERM GOAL #3   Title wean from cane > or = to 50% of the time and demonstrate symmetry on level surface   Time 8   Period Weeks   Status New   PT LONG TERM GOAL #4   Title report < or = to 3/10 Rt knee pain with ambulating in the community    Time 8   Period Weeks   Status New   PT LONG TERM GOAL #5   Title ascend and descend steps with step-over-step gait > 60% of the time   Time 8   Period Weeks   Status New   Additional Long Term Goals   Additional Long Term Goals Yes   PT LONG TERM GOAL #6   Title demonstrate 4+/5 Rt hip and knee strength to improve endurance for community ambulation and return to work   Time 8   Period Weeks   Status New   PT LONG TERM GOAL #7   Title demonstrate Rt knee flexion to > or = to 110 degrees to allow for squatting and steps without limitation   Time 8   Period Weeks   Status New               Plan - 01/13/15 1046    Clinical Impression Statement Weather continues to keep knee stiff per pt report. Completes all exercises well wihtout limitation, including longer stretching time and more resistance for strength. Pt going down stairs better, still uses 2 rails but can do step to step.    Pt will benefit from skilled therapeutic intervention in order to improve on the following deficits Abnormal gait;Decreased range of motion;Difficulty walking;Pain;Increased edema;Decreased strength;Decreased scar mobility;Decreased activity tolerance;Decreased endurance   Rehab Potential Excellent   PT Frequency 3x / week   PT Duration 8 weeks   PT Treatment/Interventions ADLs/Self Care Home  Management;Cryotherapy;Electrical  Stimulation;Moist Heat;Therapeutic exercise;Therapeutic activities;Functional mobility training;Stair training;Gait training;Ultrasound;Neuromuscular re-education;Patient/family education;Manual techniques;Vasopneumatic Device;Taping;Passive range of motion;Scar mobilization   PT Next Visit Plan Hip stretching, knee strength, edema management   Consulted and Agree with Plan of Care Patient        Problem List Patient Active Problem List   Diagnosis Date Noted  . Chest pain 12/08/2014  . OA (osteoarthritis) of knee 11/28/2014  . Varicose veins of leg with complications 84/69/6295  . Varicose veins of lower extremities with other complications 28/41/3244  . Hyperlipidemia   . Diabetes ([place])   . HTN (hypertension)   . GERD (gastroesophageal reflux disease)   . CKD (chronic kidney disease)   . PATELLAR TENDINITIS 07/13/2009  . LEG PAIN, LEFT 07/13/2009    Daren Doswell, PTA 01/13/2015, 10:53 AM  [facility] [address], [place], [place], [address] Phone: [phone]   Fax:  [phone]

## 2015-01-16 ENCOUNTER — Encounter: Payer: Self-pay | Admitting: Physical Therapy

## 2015-01-16 ENCOUNTER — Ambulatory Visit: Payer: 59 | Admitting: Physical Therapy

## 2015-01-16 DIAGNOSIS — M25661 Stiffness of right knee, not elsewhere classified: Secondary | ICD-10-CM

## 2015-01-16 DIAGNOSIS — M25561 Pain in right knee: Secondary | ICD-10-CM

## 2015-01-16 DIAGNOSIS — M25469 Effusion, unspecified knee: Secondary | ICD-10-CM

## 2015-01-16 DIAGNOSIS — R29898 Other symptoms and signs involving the musculoskeletal system: Secondary | ICD-10-CM

## 2015-01-16 DIAGNOSIS — R269 Unspecified abnormalities of gait and mobility: Secondary | ICD-10-CM

## 2015-01-16 NOTE — Therapy (Signed)
[facility] [address] [place], [place], [address] Phone: [phone]   Fax:  [phone]  Physical Therapy Treatment  Patient Details  Name: Jessica Bowers MRN: [id number] Date of Birth: 1951/03/15 Referring Provider:  Gaynelle Arabian, MD  Encounter Date: 01/16/2015      PT End of Session - 01/16/15 1402    Visit Number 12   Date for PT Re-Evaluation 02/14/15   PT Start Time 1355   Activity Tolerance Patient tolerated treatment well   Behavior During Therapy [facility] for tasks assessed/performed      Past Medical History  Diagnosis Date  . Obesity   . Hyperlipidemia   . Diabetes ([place])   . HTN (hypertension)   . GERD (gastroesophageal reflux disease)   . CKD (chronic kidney disease)   . Patellar tendinitis   . LEG PAIN, LEFT   . Arthritis   . Varicose veins   . Varicose vein     bilat   . Bronchitis     hx of   . Shaking     hands  . Shingles     hx of   . History of chicken pox   . Measles     hx of   . Mumps     hx of     Past Surgical History  Procedure Laterality Date  . Tonsillectomy    . Lasik    . Knee surgery  2005    right  . Colonoscopy    . Abdominal hysterectomy  1997  . Excision haglund's deformity with achilles tendon repair Left 05/06/2013    Procedure: LEFT ACHILLES DEBRIDEMENT AND RESECTION / HAGLUND'S EXCISION;  Surgeon: Wylene Simmer, MD;  Location: [place];  Service: Orthopedics;  Laterality: Left;  . Gastrocnemius recession Left 05/06/2013    Procedure: LEFT GASTROC RECESSION;  Surgeon: Wylene Simmer, MD;  Location: [place];  Service: Orthopedics;  Laterality: Left;  . Carpal tunnel release Left 04/07/2014    Procedure: LEFT CARPAL TUNNEL RELEASE;  Surgeon: Daryll Brod, MD;  Location: [place];  Service: Orthopedics;  Laterality: Left;  . Varicose vein surgery    .  stab plebectomy      03/21/2014   . Total knee  arthroplasty Right 11/28/2014    Procedure: TOTAL RIGHT KNEE ARTHROPLASTY;  Surgeon: Gaynelle Arabian, MD;  [facility];  Service: Orthopedics;  Laterality: Right;    There were no vitals filed for this visit.  Visit Diagnosis:  Knee stiffness, right  Weakness of right leg  Swelling of joint of lower leg  Knee pain, acute, right  Abnormality of gait          [facility] PT Assessment - 01/16/15 0001    AROM   Right Knee Flexion 100                     [facility] Adult PT Treatment/Exercise - 01/16/15 0001    Ambulation/Gait   Stairs Yes   Stairs Assistance 6: Modified independent (Device/Increase time)   Number of Stairs 12   Height of Stairs 6   Door Management 6: Modified independent (Device/Increase time)   Gait Comments needs to use twop rails   Knee/Hip Exercises: Stretches   Sports administrator Right;3 reps;20 seconds   Knee/Hip Exercises: Aerobic   Nustep L5 x 10 min   Knee/Hip Exercises: Machines for Strengthening   Total Gym Leg Press St #7 75# 30x, RTLE 35#  20x   Knee/Hip Exercises: Standing   Lateral Step Up Right;1 set;20 reps;Hand Hold: 2;Step Height: 6"   Forward Step Up Right;2 sets;10 reps;Hand Hold: 2;Step Height: 6"   Step Down Right;2 sets;10 reps;Hand Hold: 2;Step Height: 6"   Walking with Sports Cord 25# forward/reverse, backward/reverse x10   Vasopneumatic   Number Minutes Vasopneumatic  15 minutes   Vasopnuematic Location  Knee   Vasopneumatic Pressure Medium   Vasopneumatic Temperature  3 snowflakes   Manual Therapy   Manual Therapy Soft tissue mobilization   Manual therapy comments to Rt knee post/ant & along ITB                  PT Short Term Goals - 01/16/15 1418    PT SHORT TERM GOAL #1   Title be independent in initial HEP   Time 4   Period Weeks   Status Achieved   PT SHORT TERM GOAL #2   Title demonstrate Rt knee AROM extension to lacking < or = to 10 degrees to normalize gait pattern   Time 4   Period Weeks    Status On-going   PT SHORT TERM GOAL #3   Title increase Rt LE strength to stand and walk for 20 minutes without limitation   Time 4   Period Weeks   Status Achieved   PT SHORT TERM GOAL #4   Title ascend steps with step-over-step gait pattern   Time 4   Period Weeks   Status On-going   PT SHORT TERM GOAL #5   Title demonstrate Rt knee AROM flexion to > or = to 100 degrees to sit and negotiate steps without substitution   Time 4   Period Weeks   Status On-going           PT Long Term Goals - 01/16/15 1420    PT LONG TERM GOAL #1   Title be independent in advanced HEP   Time 8   Period Weeks   Status On-going   PT LONG TERM GOAL #2   Title reduce FOTO to < or = to 45% limitation   Time 8   Period Weeks   Status On-going   PT LONG TERM GOAL #3   Title wean from cane > or = to 50% of the time and demonstrate symmetry on level surface   Time 8   Period Weeks   Status On-going   PT LONG TERM GOAL #4   Title report < or = to 3/10 Rt knee pain with ambulating in the community    Time 8   Period Weeks   Status On-going   PT LONG TERM GOAL #5   Title ascend and descend steps with step-over-step gait > 60% of the time   Time 8   Period Weeks   Status On-going   PT LONG TERM GOAL #6   Title demonstrate 4+/5 Rt hip and knee strength to improve endurance for community ambulation and return to work   Time 8   Period Weeks   Status On-going   PT LONG TERM GOAL #7   Title demonstrate Rt knee flexion to > or = to 110 degrees to allow for squatting and steps without limitation   Time 8   Period Weeks   Status On-going               Plan - 01/16/15 1403    Clinical Impression Statement Pt continues to complain of stiffness and tightness in Rt knee.  Pt unable for full revolution on the bike. Pt will continue to benefit from PT to improve with AROM, strength and endurance with Rt LE.    Pt will benefit from skilled therapeutic intervention in order to improve on the  following deficits Abnormal gait;Decreased range of motion;Difficulty walking;Pain;Increased edema;Decreased strength;Decreased scar mobility;Decreased activity tolerance;Decreased endurance   Rehab Potential Excellent   PT Frequency 3x / week   PT Duration 8 weeks   PT Treatment/Interventions ADLs/Self Care Home Management;Cryotherapy;Electrical Stimulation;Moist Heat;Therapeutic exercise;Therapeutic activities;Functional mobility training;Stair training;Gait training;Ultrasound;Neuromuscular re-education;Patient/family education;Manual techniques;Vasopneumatic Device;Taping;Passive range of motion;Scar mobilization   PT Next Visit Plan Continue hip stretching, knee strength, edema managment   Consulted and Agree with Plan of Care Patient        Problem List Patient Active Problem List   Diagnosis Date Noted  . Chest pain 12/08/2014  . OA (osteoarthritis) of knee 11/28/2014  . Varicose veins of leg with complications 49/67/5916  . Varicose veins of lower extremities with other complications 38/46/6599  . Hyperlipidemia   . Diabetes ([place])   . HTN (hypertension)   . GERD (gastroesophageal reflux disease)   . CKD (chronic kidney disease)   . PATELLAR TENDINITIS 07/13/2009  . LEG PAIN, LEFT 07/13/2009    NAUMANN-HOUEGNIFIO,Shandel Busic PTA 01/16/2015, 2:46 PM  [facility] [address] [place], [place], [address] Phone: [phone]   Fax:  [phone]

## 2015-01-18 ENCOUNTER — Ambulatory Visit: Payer: 59 | Admitting: Physical Therapy

## 2015-01-18 ENCOUNTER — Encounter: Payer: Self-pay | Admitting: Physical Therapy

## 2015-01-18 DIAGNOSIS — R29898 Other symptoms and signs involving the musculoskeletal system: Secondary | ICD-10-CM

## 2015-01-18 DIAGNOSIS — M25561 Pain in right knee: Secondary | ICD-10-CM

## 2015-01-18 DIAGNOSIS — M25661 Stiffness of right knee, not elsewhere classified: Secondary | ICD-10-CM | POA: Diagnosis not present

## 2015-01-18 DIAGNOSIS — R269 Unspecified abnormalities of gait and mobility: Secondary | ICD-10-CM

## 2015-01-18 DIAGNOSIS — M25469 Effusion, unspecified knee: Secondary | ICD-10-CM

## 2015-01-18 NOTE — Therapy (Signed)
[facility] [address] [place], [place], [address] Phone: [phone]   Fax:  [phone]  Physical Therapy Treatment  Patient Details  Name: Jessica Bowers MRN: [id number] Date of Birth: 1950/07/16 Referring Provider:  Gaynelle Arabian, MD  Encounter Date: 01/18/2015      PT End of Session - 01/18/15 1403    Visit Number 13   Date for PT Re-Evaluation 02/14/15   PT Start Time 2505   PT Stop Time 1500   PT Time Calculation (min) 62 min   Activity Tolerance Patient tolerated treatment well   Behavior During Therapy [facility] for tasks assessed/performed      Past Medical History  Diagnosis Date  . Obesity   . Hyperlipidemia   . Diabetes ([place])   . HTN (hypertension)   . GERD (gastroesophageal reflux disease)   . CKD (chronic kidney disease)   . Patellar tendinitis   . LEG PAIN, LEFT   . Arthritis   . Varicose veins   . Varicose vein     bilat   . Bronchitis     hx of   . Shaking     hands  . Shingles     hx of   . History of chicken pox   . Measles     hx of   . Mumps     hx of     Past Surgical History  Procedure Laterality Date  . Tonsillectomy    . Lasik    . Knee surgery  2005    right  . Colonoscopy    . Abdominal hysterectomy  1997  . Excision haglund's deformity with achilles tendon repair Left 05/06/2013    Procedure: LEFT ACHILLES DEBRIDEMENT AND RESECTION / HAGLUND'S EXCISION;  Surgeon: Wylene Simmer, MD;  Location: [place];  Service: Orthopedics;  Laterality: Left;  . Gastrocnemius recession Left 05/06/2013    Procedure: LEFT GASTROC RECESSION;  Surgeon: Wylene Simmer, MD;  Location: [place];  Service: Orthopedics;  Laterality: Left;  . Carpal tunnel release Left 04/07/2014    Procedure: LEFT CARPAL TUNNEL RELEASE;  Surgeon: Daryll Brod, MD;  Location: [place];  Service: Orthopedics;  Laterality: Left;  . Varicose vein surgery    .   stab plebectomy      03/21/2014   . Total knee arthroplasty Right 11/28/2014    Procedure: TOTAL RIGHT KNEE ARTHROPLASTY;  Surgeon: Gaynelle Arabian, MD;  [facility];  Service: Orthopedics;  Laterality: Right;    There were no vitals filed for this visit.  Visit Diagnosis:  Knee stiffness, right  Weakness of right leg  Swelling of joint of lower leg  Knee pain, acute, right  Abnormality of gait      Subjective Assessment - 01/18/15 1359    Subjective Good day, no complaints, frustrated she can't go around on the bike.    Currently in Pain? No/denies   Multiple Pain Sites No            [facility] PT Assessment - 01/18/15 0001    AROM   Right Knee Flexion 103                     [facility] Adult PT Treatment/Exercise - 01/18/15 0001    Knee/Hip Exercises: Aerobic   Nustep L5 x 10 min   Knee/Hip Exercises: Machines for Strengthening   Total Gym Leg Press St #7 bil 75#, RTLE 35# 10x  40#  2x10   Knee/Hip Exercises: Standing   Lateral Step Up Right;1 set;20 reps;Hand Hold: 2;Step Height: 6"   Forward Step Up Right;2 sets;10 reps;Hand Hold: 2;Step Height: 6"   Step Down Right;2 sets;10 reps;Hand Hold: 2;Step Height: 6"   Rocker Board 3 minutes   Rebounder 3 way weight shift 1 min   Walking with Sports Cord 25# forward/reverse, backward/reverse x10  Added lateral 5x each   Knee/Hip Exercises: Seated   Long Arc Quad Strengthening;Right;3 sets;10 reps;Weights   Long Arc Quad Weight 5 lbs.   Long [employer] Limitations Demonstrated how to do at home with tband.   Vasopneumatic   Number Minutes Vasopneumatic  15 minutes   Vasopnuematic Location  Knee   Vasopneumatic Pressure Medium   Vasopneumatic Temperature  3 snowflakes                  PT Short Term Goals - 01/16/15 1418    PT SHORT TERM GOAL #1   Title be independent in initial HEP   Time 4   Period Weeks   Status Achieved   PT SHORT TERM GOAL #2   Title demonstrate Rt knee AROM extension to  lacking < or = to 10 degrees to normalize gait pattern   Time 4   Period Weeks   Status On-going   PT SHORT TERM GOAL #3   Title increase Rt LE strength to stand and walk for 20 minutes without limitation   Time 4   Period Weeks   Status Achieved   PT SHORT TERM GOAL #4   Title ascend steps with step-over-step gait pattern   Time 4   Period Weeks   Status On-going   PT SHORT TERM GOAL #5   Title demonstrate Rt knee AROM flexion to > or = to 100 degrees to sit and negotiate steps without substitution   Time 4   Period Weeks   Status On-going           PT Long Term Goals - 01/16/15 1420    PT LONG TERM GOAL #1   Title be independent in advanced HEP   Time 8   Period Weeks   Status On-going   PT LONG TERM GOAL #2   Title reduce FOTO to < or = to 45% limitation   Time 8   Period Weeks   Status On-going   PT LONG TERM GOAL #3   Title wean from cane > or = to 50% of the time and demonstrate symmetry on level surface   Time 8   Period Weeks   Status On-going   PT LONG TERM GOAL #4   Title report < or = to 3/10 Rt knee pain with ambulating in the community    Time 8   Period Weeks   Status On-going   PT LONG TERM GOAL #5   Title ascend and descend steps with step-over-step gait > 60% of the time   Time 8   Period Weeks   Status On-going   PT LONG TERM GOAL #6   Title demonstrate 4+/5 Rt hip and knee strength to improve endurance for community ambulation and return to work   Time 8   Period Weeks   Status On-going   PT LONG TERM GOAL #7   Title demonstrate Rt knee flexion to > or = to 110 degrees to allow for squatting and steps without limitation   Time 8   Period Weeks   Status On-going  Plan - 01/18/15 1443    Clinical Impression Statement Knee felt pretty god today per pt and as a result measured with increased flexion today. She will continue to wear her compression garments as they help with her swelling.    Pt will benefit from  skilled therapeutic intervention in order to improve on the following deficits Abnormal gait;Decreased range of motion;Difficulty walking;Pain;Increased edema;Decreased strength;Decreased scar mobility;Decreased activity tolerance;Decreased endurance   Rehab Potential Excellent   PT Frequency 3x / week   PT Duration 8 weeks   PT Treatment/Interventions ADLs/Self Care Home Management;Cryotherapy;Electrical Stimulation;Moist Heat;Therapeutic exercise;Therapeutic activities;Functional mobility training;Stair training;Gait training;Ultrasound;Neuromuscular re-education;Patient/family education;Manual techniques;Vasopneumatic Device;Taping;Passive range of motion;Scar mobilization   PT Next Visit Plan Continue hip stretching, knee strength, edema managment   Consulted and Agree with Plan of Care Patient        Problem List Patient Active Problem List   Diagnosis Date Noted  . Chest pain 12/08/2014  . OA (osteoarthritis) of knee 11/28/2014  . Varicose veins of leg with complications 82/64/1583  . Varicose veins of lower extremities with other complications 09/40/7680  . Hyperlipidemia   . Diabetes ([place])   . HTN (hypertension)   . GERD (gastroesophageal reflux disease)   . CKD (chronic kidney disease)   . PATELLAR TENDINITIS 07/13/2009  . LEG PAIN, LEFT 07/13/2009    Sandria Mcenroe, PTA 01/18/2015, 2:45 PM  [facility] [address] [place], [place], [address] Phone: [phone]   Fax:  [phone]

## 2015-01-19 ENCOUNTER — Encounter: Payer: Self-pay | Admitting: Physical Therapy

## 2015-01-19 ENCOUNTER — Ambulatory Visit: Payer: 59 | Admitting: Physical Therapy

## 2015-01-19 DIAGNOSIS — M25661 Stiffness of right knee, not elsewhere classified: Secondary | ICD-10-CM

## 2015-01-19 DIAGNOSIS — R269 Unspecified abnormalities of gait and mobility: Secondary | ICD-10-CM

## 2015-01-19 DIAGNOSIS — M25561 Pain in right knee: Secondary | ICD-10-CM

## 2015-01-19 DIAGNOSIS — M25469 Effusion, unspecified knee: Secondary | ICD-10-CM

## 2015-01-19 DIAGNOSIS — R29898 Other symptoms and signs involving the musculoskeletal system: Secondary | ICD-10-CM

## 2015-01-19 NOTE — Therapy (Signed)
[facility] [address], [place], [place], [address] Phone: [phone]   Fax:  [phone]  Physical Therapy Treatment  Patient Details  Name: Jessica Bowers MRN: [id number] Date of Birth: February 21, 1951 Referring Provider:  Gaynelle Arabian, MD  Encounter Date: 01/19/2015      PT End of Session - 01/19/15 1402    Visit Number 14   Date for PT Re-Evaluation 02/14/15   PT Start Time 1400   PT Stop Time 1445   PT Time Calculation (min) 45 min   Activity Tolerance Patient tolerated treatment well   Behavior During Therapy [facility] for tasks assessed/performed      Past Medical History  Diagnosis Date  . Obesity   . Hyperlipidemia   . Diabetes ([place])   . HTN (hypertension)   . GERD (gastroesophageal reflux disease)   . CKD (chronic kidney disease)   . Patellar tendinitis   . LEG PAIN, LEFT   . Arthritis   . Varicose veins   . Varicose vein     bilat   . Bronchitis     hx of   . Shaking     hands  . Shingles     hx of   . History of chicken pox   . Measles     hx of   . Mumps     hx of     Past Surgical History  Procedure Laterality Date  . Tonsillectomy    . Lasik    . Knee surgery  2005    right  . Colonoscopy    . Abdominal hysterectomy  1997  . Excision haglund's deformity with achilles tendon repair Left 05/06/2013    Procedure: LEFT ACHILLES DEBRIDEMENT AND RESECTION / HAGLUND'S EXCISION;  Surgeon: Wylene Simmer, MD;  Location: [place];  Service: Orthopedics;  Laterality: Left;  . Gastrocnemius recession Left 05/06/2013    Procedure: LEFT GASTROC RECESSION;  Surgeon: Wylene Simmer, MD;  Location: [place];  Service: Orthopedics;  Laterality: Left;  . Carpal tunnel release Left 04/07/2014    Procedure: LEFT CARPAL TUNNEL RELEASE;  Surgeon: Daryll Brod, MD;  Location: [place];  Service: Orthopedics;  Laterality: Left;  . Varicose vein surgery    .   stab plebectomy      03/21/2014   . Total knee arthroplasty Right 11/28/2014    Procedure: TOTAL RIGHT KNEE ARTHROPLASTY;  Surgeon: Gaynelle Arabian, MD;  [facility];  Service: Orthopedics;  Laterality: Right;    There were no vitals filed for this visit.  Visit Diagnosis:  Knee stiffness, right  Weakness of right leg  Swelling of joint of lower leg  Knee pain, acute, right  Abnormality of gait      Subjective Assessment - 01/19/15 1404    Subjective I have trouble getting around on the bike.    Pertinent History Lt Achilles tendon repair 04/22/14   Limitations Sitting;Standing;Walking   How long can you stand comfortably? 15 minutes   How long can you walk comfortably? 10-15 minutes with use of cane   Patient Stated Goals wean from cane, increase ROM of knee, improve strength and endurance   Currently in Pain? No/denies   Multiple Pain Sites No                         [facility] Adult PT Treatment/Exercise - 01/19/15 0001    Knee/Hip Exercises: Aerobic   Stationary  Bike rocking back and forth for 10 min.    Knee/Hip Exercises: Machines for Strengthening   Total Gym Leg Press St #7 bil 75#, RTLE 35# 10x  40# 2x10   Knee/Hip Exercises: Standing   Lateral Step Up Right;1 set;20 reps;Hand Hold: 2;Step Height: 6"   Forward Step Up Right;2 sets;10 reps;Hand Hold: 2;Step Height: 6"   Step Down Right;2 sets;10 reps;Hand Hold: 2;Step Height: 6"   Rocker Board 3 minutes   Rebounder 3 way weight shift 1 min   Walking with Sports Cord 25# forward/reverse, backward/reverse x10  Added lateral 5x each 20#   Knee/Hip Exercises: Seated   Long Arc Quad Strengthening;Right;3 sets;10 reps;Weights   Long Arc Quad Weight 5 lbs.   Vasopneumatic   Number Minutes Vasopneumatic  15 minutes   Vasopnuematic Location  Knee   Vasopneumatic Pressure Medium   Vasopneumatic Temperature  3 snowflakes   Manual Therapy   Manual Therapy Soft tissue mobilization   Manual therapy  comments to Rt knee post/ant & along ITB                PT Education - 01/19/15 1444    Education provided No          PT Short Term Goals - 01/16/15 1418    PT SHORT TERM GOAL #1   Title be independent in initial HEP   Time 4   Period Weeks   Status Achieved   PT SHORT TERM GOAL #2   Title demonstrate Rt knee AROM extension to lacking < or = to 10 degrees to normalize gait pattern   Time 4   Period Weeks   Status On-going   PT SHORT TERM GOAL #3   Title increase Rt LE strength to stand and walk for 20 minutes without limitation   Time 4   Period Weeks   Status Achieved   PT SHORT TERM GOAL #4   Title ascend steps with step-over-step gait pattern   Time 4   Period Weeks   Status On-going   PT SHORT TERM GOAL #5   Title demonstrate Rt knee AROM flexion to > or = to 100 degrees to sit and negotiate steps without substitution   Time 4   Period Weeks   Status On-going           PT Long Term Goals - 01/16/15 1420    PT LONG TERM GOAL #1   Title be independent in advanced HEP   Time 8   Period Weeks   Status On-going   PT LONG TERM GOAL #2   Title reduce FOTO to < or = to 45% limitation   Time 8   Period Weeks   Status On-going   PT LONG TERM GOAL #3   Title wean from cane > or = to 50% of the time and demonstrate symmetry on level surface   Time 8   Period Weeks   Status On-going   PT LONG TERM GOAL #4   Title report < or = to 3/10 Rt knee pain with ambulating in the community    Time 8   Period Weeks   Status On-going   PT LONG TERM GOAL #5   Title ascend and descend steps with step-over-step gait > 60% of the time   Time 8   Period Weeks   Status On-going   PT LONG TERM GOAL #6   Title demonstrate 4+/5 Rt hip and knee strength to improve endurance for community ambulation and  return to work   Time 8   Period Weeks   Status On-going   PT LONG TERM GOAL #7   Title demonstrate Rt knee flexion to > or = to 110 degrees to allow for squatting  and steps without limitation   Time 8   Period Weeks   Status On-going               Plan - 01/19/15 1444    Clinical Impression Statement Patient is able to do exercises without increased pain.  Patient stands with increased ankle supination.  Patient is still wearing compression garments.  Patient wil lbeneift from physical therapy to increase ROM and strength.    Pt will benefit from skilled therapeutic intervention in order to improve on the following deficits Abnormal gait;Decreased range of motion;Difficulty walking;Pain;Increased edema;Decreased strength;Decreased scar mobility;Decreased activity tolerance;Decreased endurance   Rehab Potential Excellent   Clinical Impairments Affecting Rehab Potential None   PT Frequency 3x / week   PT Duration 8 weeks   PT Treatment/Interventions ADLs/Self Care Home Management;Cryotherapy;Electrical Stimulation;Moist Heat;Therapeutic exercise;Therapeutic activities;Functional mobility training;Stair training;Gait training;Ultrasound;Neuromuscular re-education;Patient/family education;Manual techniques;Vasopneumatic Device;Taping;Passive range of motion;Scar mobilization   PT Next Visit Plan Continue hip stretching, knee strength, edema managment   PT Home Exercise Plan progress as needed   Recommended Other Services None   Consulted and Agree with Plan of Care Patient        Problem List Patient Active Problem List   Diagnosis Date Noted  . Chest pain 12/08/2014  . OA (osteoarthritis) of knee 11/28/2014  . Varicose veins of leg with complications 33/38/3291  . Varicose veins of lower extremities with other complications 91/66/0600  . Hyperlipidemia   . Diabetes ([place])   . HTN (hypertension)   . GERD (gastroesophageal reflux disease)   . CKD (chronic kidney disease)   . PATELLAR TENDINITIS 07/13/2009  . LEG PAIN, LEFT 07/13/2009    GRAY,CHERYL,PT 01/19/2015, 2:48 PM  [facility] [address] [place], [place], [address] Phone: [phone]   Fax:  [phone]

## 2015-01-23 ENCOUNTER — Ambulatory Visit: Payer: 59 | Admitting: Physical Therapy

## 2015-01-23 ENCOUNTER — Encounter: Payer: Self-pay | Admitting: Physical Therapy

## 2015-01-23 DIAGNOSIS — M25561 Pain in right knee: Secondary | ICD-10-CM

## 2015-01-23 DIAGNOSIS — R29898 Other symptoms and signs involving the musculoskeletal system: Secondary | ICD-10-CM

## 2015-01-23 DIAGNOSIS — M25661 Stiffness of right knee, not elsewhere classified: Secondary | ICD-10-CM | POA: Diagnosis not present

## 2015-01-23 DIAGNOSIS — R269 Unspecified abnormalities of gait and mobility: Secondary | ICD-10-CM

## 2015-01-23 DIAGNOSIS — M25469 Effusion, unspecified knee: Secondary | ICD-10-CM

## 2015-01-23 NOTE — Therapy (Signed)
[facility] [address], [place], [place], [address] Phone: [phone]   Fax:  [phone]  Physical Therapy Treatment  Patient Details  Name: Jessica Bowers MRN: [id number] Date of Birth: June 25, 1950 No Data Recorded  Encounter Date: 01/23/2015      PT End of Session - 01/23/15 1313    Visit Number 15   Date for PT Re-Evaluation 02/14/15   PT Start Time 1229   PT Stop Time 1330   PT Time Calculation (min) 61 min   Activity Tolerance Patient tolerated treatment well   Behavior During Therapy [facility] for tasks assessed/performed      Past Medical History  Diagnosis Date  . Obesity   . Hyperlipidemia   . Diabetes ([place])   . HTN (hypertension)   . GERD (gastroesophageal reflux disease)   . CKD (chronic kidney disease)   . Patellar tendinitis   . LEG PAIN, LEFT   . Arthritis   . Varicose veins   . Varicose vein     bilat   . Bronchitis     hx of   . Shaking     hands  . Shingles     hx of   . History of chicken pox   . Measles     hx of   . Mumps     hx of     Past Surgical History  Procedure Laterality Date  . Tonsillectomy    . Lasik    . Knee surgery  2005    right  . Colonoscopy    . Abdominal hysterectomy  1997  . Excision haglund's deformity with achilles tendon repair Left 05/06/2013    Procedure: LEFT ACHILLES DEBRIDEMENT AND RESECTION / HAGLUND'S EXCISION;  Surgeon: Wylene Simmer, MD;  Location: [place];  Service: Orthopedics;  Laterality: Left;  . Gastrocnemius recession Left 05/06/2013    Procedure: LEFT GASTROC RECESSION;  Surgeon: Wylene Simmer, MD;  Location: [place];  Service: Orthopedics;  Laterality: Left;  . Carpal tunnel release Left 04/07/2014    Procedure: LEFT CARPAL TUNNEL RELEASE;  Surgeon: Daryll Brod, MD;  Location: [place];  Service: Orthopedics;  Laterality: Left;  . Varicose vein surgery    .  stab plebectomy     03/21/2014   . Total knee arthroplasty Right 11/28/2014    Procedure: TOTAL RIGHT KNEE ARTHROPLASTY;  Surgeon: Gaynelle Arabian, MD;  [facility];  Service: Orthopedics;  Laterality: Right;    There were no vitals filed for this visit.  Visit Diagnosis:  Knee stiffness, right  Weakness of right leg  Swelling of joint of lower leg  Abnormality of gait  Knee pain, acute, right      Subjective Assessment - 01/23/15 1233    Subjective Knee doing good.    Currently in Pain? No/denies   Multiple Pain Sites No                         [facility] Adult PT Treatment/Exercise - 01/23/15 0001    Knee/Hip Exercises: Aerobic   Stationary Bike rocking x 5 min   Nustep L5 x 10 min   Knee/Hip Exercises: Machines for Strengthening   Total Gym Leg Press St #7 bil 75#, RTLE 35# 10x  40# 2x10   Knee/Hip Exercises: Standing   Lateral Step Up Right;1 set;20 reps;Hand Hold: 2;Step Height: 6"   Forward Step Up Right;2 sets;10 reps;Hand Hold: 2;Step Height:  6"   Step Down Right;2 sets;10 reps;Hand Hold: 2;Step Height: 6"   Rocker Board 3 minutes   Rebounder 3 way weight shift 1 min   Walking with Sports Cord 25# forward/reverse, backward/reverse x10  Added lateral 8x each 20#   Knee/Hip Exercises: Seated   Long Arc Quad Strengthening;Right;3 sets;10 reps   Long Arc Quad Weight 5 lbs.   Vasopneumatic   Number Minutes Vasopneumatic  15 minutes   Vasopnuematic Location  Knee   Vasopneumatic Pressure Medium   Vasopneumatic Temperature  3 snowflakes                  PT Short Term Goals - 01/16/15 1418    PT SHORT TERM GOAL #1   Title be independent in initial HEP   Time 4   Period Weeks   Status Achieved   PT SHORT TERM GOAL #2   Title demonstrate Rt knee AROM extension to lacking < or = to 10 degrees to normalize gait pattern   Time 4   Period Weeks   Status On-going   PT SHORT TERM GOAL #3   Title increase Rt LE strength to stand and walk for 20 minutes without  limitation   Time 4   Period Weeks   Status Achieved   PT SHORT TERM GOAL #4   Title ascend steps with step-over-step gait pattern   Time 4   Period Weeks   Status On-going   PT SHORT TERM GOAL #5   Title demonstrate Rt knee AROM flexion to > or = to 100 degrees to sit and negotiate steps without substitution   Time 4   Period Weeks   Status On-going           PT Long Term Goals - 01/16/15 1420    PT LONG TERM GOAL #1   Title be independent in advanced HEP   Time 8   Period Weeks   Status On-going   PT LONG TERM GOAL #2   Title reduce FOTO to < or = to 45% limitation   Time 8   Period Weeks   Status On-going   PT LONG TERM GOAL #3   Title wean from cane > or = to 50% of the time and demonstrate symmetry on level surface   Time 8   Period Weeks   Status On-going   PT LONG TERM GOAL #4   Title report < or = to 3/10 Rt knee pain with ambulating in the community    Time 8   Period Weeks   Status On-going   PT LONG TERM GOAL #5   Title ascend and descend steps with step-over-step gait > 60% of the time   Time 8   Period Weeks   Status On-going   PT LONG TERM GOAL #6   Title demonstrate 4+/5 Rt hip and knee strength to improve endurance for community ambulation and return to work   Time 8   Period Weeks   Status On-going   PT LONG TERM GOAL #7   Title demonstrate Rt knee flexion to > or = to 110 degrees to allow for squatting and steps without limitation   Time 8   Period Weeks   Status On-going               Plan - 01/23/15 1314    Clinical Impression Statement Pt very active this weekend. Knee appeared more swollen than last week and this could be why. She was able to  complete all exercises with less guarded movement, showing improvement in her confidence.    Pt will benefit from skilled therapeutic intervention in order to improve on the following deficits Abnormal gait;Decreased range of motion;Difficulty walking;Pain;Increased edema;Decreased  strength;Decreased scar mobility;Decreased activity tolerance;Decreased endurance   Rehab Potential Excellent   Clinical Impairments Affecting Rehab Potential None   PT Frequency 3x / week   PT Duration 8 weeks   PT Treatment/Interventions ADLs/Self Care Home Management;Cryotherapy;Electrical Stimulation;Moist Heat;Therapeutic exercise;Therapeutic activities;Functional mobility training;Stair training;Gait training;Ultrasound;Neuromuscular re-education;Patient/family education;Manual techniques;Vasopneumatic Device;Taping;Passive range of motion;Scar mobilization   PT Next Visit Plan Continue hip stretching, knee strength, edema managment   Consulted and Agree with Plan of Care Patient        Problem List Patient Active Problem List   Diagnosis Date Noted  . Chest pain 12/08/2014  . OA (osteoarthritis) of knee 11/28/2014  . Varicose veins of leg with complications 20/94/7096  . Varicose veins of lower extremities with other complications 28/36/6294  . Hyperlipidemia   . Diabetes ([place])   . HTN (hypertension)   . GERD (gastroesophageal reflux disease)   . CKD (chronic kidney disease)   . PATELLAR TENDINITIS 07/13/2009  . LEG PAIN, LEFT 07/13/2009    Shelie Lansing, PTA 01/23/2015, 1:17 PM  [facility] [address] [place], [place], [address] Phone: [phone]   Fax:  [phone]  Name: Jessica Bowers MRN: [id number] Date of Birth: Oct 20, 1950

## 2015-01-25 ENCOUNTER — Encounter: Payer: Self-pay | Admitting: Physical Therapy

## 2015-01-25 ENCOUNTER — Ambulatory Visit: Payer: 59 | Admitting: Physical Therapy

## 2015-01-25 DIAGNOSIS — R29898 Other symptoms and signs involving the musculoskeletal system: Secondary | ICD-10-CM

## 2015-01-25 DIAGNOSIS — R269 Unspecified abnormalities of gait and mobility: Secondary | ICD-10-CM

## 2015-01-25 DIAGNOSIS — M25561 Pain in right knee: Secondary | ICD-10-CM

## 2015-01-25 DIAGNOSIS — M25661 Stiffness of right knee, not elsewhere classified: Secondary | ICD-10-CM | POA: Diagnosis not present

## 2015-01-25 DIAGNOSIS — M25469 Effusion, unspecified knee: Secondary | ICD-10-CM

## 2015-01-25 NOTE — Therapy (Signed)
[facility] [address] [place], [place], [address] Phone: [phone]   Fax:  [phone]  Physical Therapy Treatment  Patient Details  Name: Jessica Bowers MRN: [id number] Date of Birth: 1950/06/26 No Data Recorded  Encounter Date: 01/25/2015      PT End of Session - 01/25/15 1310    Visit Number 16   Date for PT Re-Evaluation 02/14/15   PT Start Time 1229   PT Stop Time 1330   PT Time Calculation (min) 61 min   Activity Tolerance Patient tolerated treatment well   Behavior During Therapy [facility] for tasks assessed/performed      Past Medical History  Diagnosis Date  . Obesity   . Hyperlipidemia   . Diabetes ([place])   . HTN (hypertension)   . GERD (gastroesophageal reflux disease)   . CKD (chronic kidney disease)   . Patellar tendinitis   . LEG PAIN, LEFT   . Arthritis   . Varicose veins   . Varicose vein     bilat   . Bronchitis     hx of   . Shaking     hands  . Shingles     hx of   . History of chicken pox   . Measles     hx of   . Mumps     hx of     Past Surgical History  Procedure Laterality Date  . Tonsillectomy    . Lasik    . Knee surgery  2005    right  . Colonoscopy    . Abdominal hysterectomy  1997  . Excision haglund's deformity with achilles tendon repair Left 05/06/2013    Procedure: LEFT ACHILLES DEBRIDEMENT AND RESECTION / HAGLUND'S EXCISION;  Surgeon: Wylene Simmer, MD;  Location: [place];  Service: Orthopedics;  Laterality: Left;  . Gastrocnemius recession Left 05/06/2013    Procedure: LEFT GASTROC RECESSION;  Surgeon: Wylene Simmer, MD;  Location: [place];  Service: Orthopedics;  Laterality: Left;  . Carpal tunnel release Left 04/07/2014    Procedure: LEFT CARPAL TUNNEL RELEASE;  Surgeon: Daryll Brod, MD;  Location: [place];  Service: Orthopedics;  Laterality: Left;  . Varicose vein surgery    .  stab plebectomy     03/21/2014   . Total knee arthroplasty Right 11/28/2014    Procedure: TOTAL RIGHT KNEE ARTHROPLASTY;  Surgeon: Gaynelle Arabian, MD;  [facility];  Service: Orthopedics;  Laterality: Right;    There were no vitals filed for this visit.  Visit Diagnosis:  Knee stiffness, right  Weakness of right leg  Swelling of joint of lower leg  Abnormality of gait  Knee pain, acute, right      Subjective Assessment - 01/25/15 1238    Subjective No complaints today. Too hot today to wear compression hose.    Currently in Pain? No/denies   Multiple Pain Sites No            [facility] PT Assessment - 01/25/15 0001    AROM   Right Knee Extension 10  Pt in long sitting   Right Knee Flexion 100                     [facility] Adult PT Treatment/Exercise - 01/25/15 0001    Knee/Hip Exercises: Stretches   Other Knee/Hip Stretches KNee to LT shoulder 3x20 sec   Knee/Hip Exercises: Aerobic   Nustep L5 x10 min   Knee/Hip  Exercises: Machines for Strengthening   Total Gym Leg Press St #7 bil 75#, RTLE 35# 10x  40# 2x10   Knee/Hip Exercises: Standing   Lateral Step Up Right;1 set;20 reps;Hand Hold: 2;Step Height: 6"   Forward Step Up Right;2 sets;10 reps;Hand Hold: 2;Step Height: 6"   Step Down Right;2 sets;10 reps;Hand Hold: 2;Step Height: 6"   Rocker Board 3 minutes   Rebounder 3 way weight shift 1 min   Walking with Sports Cord 25# forward/reverse, backward/reverse x10  Added lateral 8x each 20#   Vasopneumatic   Number Minutes Vasopneumatic  15 minutes   Vasopnuematic Location  Knee   Vasopneumatic Pressure Medium   Vasopneumatic Temperature  3 snowflakes                  PT Short Term Goals - 01/25/15 1241    PT SHORT TERM GOAL #4   Title ascend steps with step-over-step gait pattern   Time 4   Period Weeks   Status On-going  Ascending is reciprocal, descending is step to step.            PT Long Term Goals - 01/16/15 1420    PT LONG TERM GOAL #1    Title be independent in advanced HEP   Time 8   Period Weeks   Status On-going   PT LONG TERM GOAL #2   Title reduce FOTO to < or = to 45% limitation   Time 8   Period Weeks   Status On-going   PT LONG TERM GOAL #3   Title wean from cane > or = to 50% of the time and demonstrate symmetry on level surface   Time 8   Period Weeks   Status On-going   PT LONG TERM GOAL #4   Title report < or = to 3/10 Rt knee pain with ambulating in the community    Time 8   Period Weeks   Status On-going   PT LONG TERM GOAL #5   Title ascend and descend steps with step-over-step gait > 60% of the time   Time 8   Period Weeks   Status On-going   PT LONG TERM GOAL #6   Title demonstrate 4+/5 Rt hip and knee strength to improve endurance for community ambulation and return to work   Time 8   Period Weeks   Status On-going   PT LONG TERM GOAL #7   Title demonstrate Rt knee flexion to > or = to 110 degrees to allow for squatting and steps without limitation   Time 8   Period Weeks   Status On-going               Plan - 01/25/15 1311    Clinical Impression Statement Edema still limits knee flexion. Knee extension AROM slightly improved this week as long as pt is sittin gin long sitting versus supine due to her tight hip. Pt is weaned from cane for community distances.     Pt will benefit from skilled therapeutic intervention in order to improve on the following deficits Abnormal gait;Decreased range of motion;Difficulty walking;Pain;Increased edema;Decreased strength;Decreased scar mobility;Decreased activity tolerance;Decreased endurance   Rehab Potential Excellent   Clinical Impairments Affecting Rehab Potential None   PT Frequency 3x / week   PT Duration 8 weeks   PT Treatment/Interventions ADLs/Self Care Home Management;Cryotherapy;Electrical Stimulation;Moist Heat;Therapeutic exercise;Therapeutic activities;Functional mobility training;Stair training;Gait  training;Ultrasound;Neuromuscular re-education;Patient/family education;Manual techniques;Vasopneumatic Device;Taping;Passive range of motion;Scar mobilization   PT Next Visit Plan Continue  hip stretching, knee strength, edema managment   Consulted and Agree with Plan of Care Patient        Problem List Patient Active Problem List   Diagnosis Date Noted  . Chest pain 12/08/2014  . OA (osteoarthritis) of knee 11/28/2014  . Varicose veins of leg with complications 59/56/3875  . Varicose veins of lower extremities with other complications 64/33/2951  . Hyperlipidemia   . Diabetes ([place])   . HTN (hypertension)   . GERD (gastroesophageal reflux disease)   . CKD (chronic kidney disease)   . PATELLAR TENDINITIS 07/13/2009  . LEG PAIN, LEFT 07/13/2009    COCHRAN,JENNIFER, PTA 01/25/2015, 1:15 PM  [facility] [address], [place], [place], [address] Phone: [phone]   Fax:  [phone]  Name: Jessica Bowers MRN: [id number] Date of Birth: 1950/06/25

## 2015-01-27 ENCOUNTER — Encounter: Payer: Self-pay | Admitting: Physical Therapy

## 2015-01-27 ENCOUNTER — Ambulatory Visit: Payer: 59 | Admitting: Physical Therapy

## 2015-01-27 DIAGNOSIS — M25561 Pain in right knee: Secondary | ICD-10-CM

## 2015-01-27 DIAGNOSIS — R29898 Other symptoms and signs involving the musculoskeletal system: Secondary | ICD-10-CM

## 2015-01-27 DIAGNOSIS — M25661 Stiffness of right knee, not elsewhere classified: Secondary | ICD-10-CM | POA: Diagnosis not present

## 2015-01-27 DIAGNOSIS — R269 Unspecified abnormalities of gait and mobility: Secondary | ICD-10-CM

## 2015-01-27 DIAGNOSIS — M25469 Effusion, unspecified knee: Secondary | ICD-10-CM

## 2015-01-27 NOTE — Therapy (Signed)
[facility] [address] [place], [place], [address] Phone: [phone]   Fax:  [phone]  Physical Therapy Treatment  Patient Details  Name: Jessica Bowers MRN: [id number] Date of Birth: 10-Nov-1950 No Data Recorded  Encounter Date: 01/27/2015      PT End of Session - 01/27/15 1125    Visit Number 17   Date for PT Re-Evaluation 02/14/15   PT Start Time 1100   PT Stop Time 1200   PT Time Calculation (min) 60 min   Activity Tolerance Patient tolerated treatment well   Behavior During Therapy [facility] for tasks assessed/performed      Past Medical History  Diagnosis Date  . Obesity   . Hyperlipidemia   . Diabetes ([place])   . HTN (hypertension)   . GERD (gastroesophageal reflux disease)   . CKD (chronic kidney disease)   . Patellar tendinitis   . LEG PAIN, LEFT   . Arthritis   . Varicose veins   . Varicose vein     bilat   . Bronchitis     hx of   . Shaking     hands  . Shingles     hx of   . History of chicken pox   . Measles     hx of   . Mumps     hx of     Past Surgical History  Procedure Laterality Date  . Tonsillectomy    . Lasik    . Knee surgery  2005    right  . Colonoscopy    . Abdominal hysterectomy  1997  . Excision haglund's deformity with achilles tendon repair Left 05/06/2013    Procedure: LEFT ACHILLES DEBRIDEMENT AND RESECTION / HAGLUND'S EXCISION;  Surgeon: Wylene Simmer, MD;  Location: [place];  Service: Orthopedics;  Laterality: Left;  . Gastrocnemius recession Left 05/06/2013    Procedure: LEFT GASTROC RECESSION;  Surgeon: Wylene Simmer, MD;  Location: [place];  Service: Orthopedics;  Laterality: Left;  . Carpal tunnel release Left 04/07/2014    Procedure: LEFT CARPAL TUNNEL RELEASE;  Surgeon: Daryll Brod, MD;  Location: [place];  Service: Orthopedics;  Laterality: Left;  . Varicose vein surgery    .  stab plebectomy     03/21/2014   . Total knee arthroplasty Right 11/28/2014    Procedure: TOTAL RIGHT KNEE ARTHROPLASTY;  Surgeon: Gaynelle Arabian, MD;  [facility];  Service: Orthopedics;  Laterality: Right;    There were no vitals filed for this visit.  Visit Diagnosis:  Knee stiffness, right  Weakness of right leg  Swelling of joint of lower leg  Abnormality of gait  Knee pain, acute, right      Subjective Assessment - 01/27/15 1109    Subjective Having a lot of visitors come this weekend so pt busy getting house ready, knee doing well with all this activiity.   Currently in Pain? No/denies   Multiple Pain Sites No                         [facility] Adult PT Treatment/Exercise - 01/27/15 0001    Knee/Hip Exercises: Aerobic   Nustep L6 x 10    Knee/Hip Exercises: Machines for Strengthening   Total Gym Leg Press St #7 bil 80# 3x10, RTLE 40# 3x 10    Knee/Hip Exercises: Standing   Lateral Step Up Right;1 set;20 reps;Hand Hold: 2;Step Height: 6"  Forward Step Up Right;2 sets;10 reps;Hand Hold: 1;Step Height: 6"   Step Down Right;2 sets;10 reps;Hand Hold: 2;Step Height: 6"   Rocker Board 3 minutes   Rebounder 3 way weight shift 1 min  No UE support   Walking with Sports Cord 25# forward/reverse, backward/reverse x10  Added lateral 8x each 20#   Knee/Hip Exercises: Seated   Long Arc Quad Strengthening;Right;3 sets;10 reps;Weights   Long Arc Quad Weight 5 lbs.   Vasopneumatic   Number Minutes Vasopneumatic  15 minutes   Vasopnuematic Location  Knee   Vasopneumatic Pressure High   Vasopneumatic Temperature  3 snowflakes   Manual Therapy   Manual Therapy Soft tissue mobilization   Manual therapy comments RT knee quad, ITBAnd   used biofreeze                  PT Short Term Goals - 01/27/15 1130    PT SHORT TERM GOAL #5   Title demonstrate Rt knee AROM flexion to > or = to 100 degrees to sit and negotiate steps without substitution   Time 4   Period Weeks    Status Achieved           PT Long Term Goals - 01/27/15 1132    PT LONG TERM GOAL #3   Title wean from cane > or = to 50% of the time and demonstrate symmetry on level surface   Time 8   Period Weeks   Status Achieved   PT LONG TERM GOAL #4   Title report < or = to 3/10 Rt knee pain with ambulating in the community    Time 8   Period Weeks   Status Achieved  No greater than 3/10.               Plan - 01/27/15 1126    Clinical Impression Statement Remains with edema, pt reports too hot this week to wear her compression garment.  She can go up stairs using one rail now and was able to increase her weight on leg press.    Pt will benefit from skilled therapeutic intervention in order to improve on the following deficits Abnormal gait;Decreased range of motion;Difficulty walking;Pain;Increased edema;Decreased strength;Decreased scar mobility;Decreased activity tolerance;Decreased endurance   Rehab Potential Excellent   Clinical Impairments Affecting Rehab Potential None   PT Frequency 3x / week   PT Duration 8 weeks   PT Treatment/Interventions ADLs/Self Care Home Management;Cryotherapy;Electrical Stimulation;Moist Heat;Therapeutic exercise;Therapeutic activities;Functional mobility training;Stair training;Gait training;Ultrasound;Neuromuscular re-education;Patient/family education;Manual techniques;Vasopneumatic Device;Taping;Passive range of motion;Scar mobilization   PT Next Visit Plan Continue hip stretching, knee strength, edema managment   Consulted and Agree with Plan of Care Patient        Problem List Patient Active Problem List   Diagnosis Date Noted  . Chest pain 12/08/2014  . OA (osteoarthritis) of knee 11/28/2014  . Varicose veins of leg with complications 12/45/8099  . Varicose veins of lower extremities with other complications 83/38/2505  . Hyperlipidemia   . Diabetes ([place])   . HTN (hypertension)   . GERD (gastroesophageal reflux disease)   . CKD  (chronic kidney disease)   . PATELLAR TENDINITIS 07/13/2009  . LEG PAIN, LEFT 07/13/2009    COCHRAN,JENNIFER, PTA 01/27/2015, 11:49 AM  [facility] [address] [place], [place], [address] Phone: [phone]   Fax:  [phone]  Name: Jessica Bowers MRN: [id number] Date of Birth: Nov 11, 1950

## 2015-01-30 ENCOUNTER — Encounter: Payer: Self-pay | Admitting: Physical Therapy

## 2015-01-30 ENCOUNTER — Ambulatory Visit: Payer: 59 | Admitting: Physical Therapy

## 2015-01-30 DIAGNOSIS — R269 Unspecified abnormalities of gait and mobility: Secondary | ICD-10-CM

## 2015-01-30 DIAGNOSIS — M25469 Effusion, unspecified knee: Secondary | ICD-10-CM

## 2015-01-30 DIAGNOSIS — M25661 Stiffness of right knee, not elsewhere classified: Secondary | ICD-10-CM

## 2015-01-30 DIAGNOSIS — M25561 Pain in right knee: Secondary | ICD-10-CM

## 2015-01-30 DIAGNOSIS — R29898 Other symptoms and signs involving the musculoskeletal system: Secondary | ICD-10-CM

## 2015-01-30 NOTE — Therapy (Signed)
[facility] [address] [place], [place], [address] Phone: [phone]   Fax:  [phone]  Physical Therapy Treatment  Patient Details  Name: Jessica Bowers MRN: [id number] Date of Birth: 29-Jan-1951 No Data Recorded  Encounter Date: 01/30/2015      PT End of Session - 01/30/15 1241    Visit Number 18   Date for PT Re-Evaluation 02/14/15   PT Start Time 1233   PT Stop Time 1330   PT Time Calculation (min) 57 min   Activity Tolerance Patient tolerated treatment well   Behavior During Therapy [facility] for tasks assessed/performed      Past Medical History  Diagnosis Date  . Obesity   . Hyperlipidemia   . Diabetes ([place])   . HTN (hypertension)   . GERD (gastroesophageal reflux disease)   . CKD (chronic kidney disease)   . Patellar tendinitis   . LEG PAIN, LEFT   . Arthritis   . Varicose veins   . Varicose vein     bilat   . Bronchitis     hx of   . Shaking     hands  . Shingles     hx of   . History of chicken pox   . Measles     hx of   . Mumps     hx of     Past Surgical History  Procedure Laterality Date  . Tonsillectomy    . Lasik    . Knee surgery  2005    right  . Colonoscopy    . Abdominal hysterectomy  1997  . Excision haglund's deformity with achilles tendon repair Left 05/06/2013    Procedure: LEFT ACHILLES DEBRIDEMENT AND RESECTION / HAGLUND'S EXCISION;  Surgeon: Wylene Simmer, MD;  Location: [place];  Service: Orthopedics;  Laterality: Left;  . Gastrocnemius recession Left 05/06/2013    Procedure: LEFT GASTROC RECESSION;  Surgeon: Wylene Simmer, MD;  Location: [place];  Service: Orthopedics;  Laterality: Left;  . Carpal tunnel release Left 04/07/2014    Procedure: LEFT CARPAL TUNNEL RELEASE;  Surgeon: Daryll Brod, MD;  Location: [place];  Service: Orthopedics;  Laterality: Left;  . Varicose vein surgery    .  stab plebectomy     03/21/2014   . Total knee arthroplasty Right 11/28/2014    Procedure: TOTAL RIGHT KNEE ARTHROPLASTY;  Surgeon: Gaynelle Arabian, MD;  [facility];  Service: Orthopedics;  Laterality: Right;    There were no vitals filed for this visit.  Visit Diagnosis:  Knee stiffness, right  Weakness of right leg  Knee pain, acute, right  Swelling of joint of lower leg  Abnormality of gait      Subjective Assessment - 01/30/15 1235    Subjective I'm worn out from having my visitors. Otherwise kneee is ok.    Currently in Pain? No/denies                         [facility] Adult PT Treatment/Exercise - 01/30/15 0001    Knee/Hip Exercises: Aerobic   Stationary Bike reverse x 10 min L0   Knee/Hip Exercises: Machines for Strengthening   Total Gym Leg Press Seat #80 30x, RTLE 40# 30x   Knee/Hip Exercises: Standing   Lateral Step Up Right;1 set;20 reps;Hand Hold: 2;Step Height: 6"   Forward Step Up Right;2 sets;10 reps;Hand Hold: 1;Step Height: 6"   Step Down Right;2 sets;10 reps;Hand  Hold: 2;Step Height: 6"   Rocker Board 3 minutes   Rebounder 3 way weight shift 1 min  No UE support   Walking with Sports Cord 30# forward/reverse, backward/reverse x10  Added lateral 8x each 20#   Vasopneumatic   Number Minutes Vasopneumatic  15 minutes   Vasopnuematic Location  Knee   Vasopneumatic Pressure High   Vasopneumatic Temperature  3 snowflakes   Manual Therapy   Manual Therapy Soft tissue mobilization   Manual therapy comments RT knee quad, ITBAnd   used biofreeze                  PT Short Term Goals - 01/30/15 1303    PT SHORT TERM GOAL #4   Title ascend steps with step-over-step gait pattern   Time 4   Status On-going           PT Long Term Goals - 01/27/15 1132    PT LONG TERM GOAL #3   Title wean from cane > or = to 50% of the time and demonstrate symmetry on level surface   Time 8   Period Weeks   Status Achieved   PT LONG TERM GOAL #4   Title report  < or = to 3/10 Rt knee pain with ambulating in the community    Time 8   Period Weeks   Status Achieved  No greater than 3/10.               Plan - 01/30/15 1357    Clinical Impression Statement Pt edema looking good today, went back to wearing her compression stocking this weekend. For the first time pt was able to pedal bike backwards for entire 10 min today. Still measured at 100 but got to that number with greatter ease.    Pt will benefit from skilled therapeutic intervention in order to improve on the following deficits Abnormal gait;Decreased range of motion;Difficulty walking;Pain;Increased edema;Decreased strength;Decreased scar mobility;Decreased activity tolerance;Decreased endurance   Rehab Potential Excellent   Clinical Impairments Affecting Rehab Potential None   PT Frequency 3x / week   PT Duration 8 weeks   PT Treatment/Interventions ADLs/Self Care Home Management;Cryotherapy;Electrical Stimulation;Moist Heat;Therapeutic exercise;Therapeutic activities;Functional mobility training;Stair training;Gait training;Ultrasound;Neuromuscular re-education;Patient/family education;Manual techniques;Vasopneumatic Device;Taping;Passive range of motion;Scar mobilization   PT Next Visit Plan Continue hip stretching, knee strength, edema managment   Consulted and Agree with Plan of Care Patient        Problem List Patient Active Problem List   Diagnosis Date Noted  . Chest pain 12/08/2014  . OA (osteoarthritis) of knee 11/28/2014  . Varicose veins of leg with complications 24/26/8341  . Varicose veins of lower extremities with other complications 96/22/2979  . Hyperlipidemia   . Diabetes ([place])   . HTN (hypertension)   . GERD (gastroesophageal reflux disease)   . CKD (chronic kidney disease)   . PATELLAR TENDINITIS 07/13/2009  . LEG PAIN, LEFT 07/13/2009    Shaheer Bonfield, PTA 01/30/2015, 2:01 PM  [facility] [address] [place], [place], [address] Phone: [phone]   Fax:  [phone]  Name: Jessica Bowers MRN: [id number] Date of Birth: 1951-01-06

## 2015-02-01 ENCOUNTER — Encounter: Payer: Self-pay | Admitting: Physical Therapy

## 2015-02-01 ENCOUNTER — Ambulatory Visit: Payer: 59 | Admitting: Physical Therapy

## 2015-02-01 DIAGNOSIS — M25661 Stiffness of right knee, not elsewhere classified: Secondary | ICD-10-CM

## 2015-02-01 DIAGNOSIS — M25561 Pain in right knee: Secondary | ICD-10-CM

## 2015-02-01 DIAGNOSIS — M25469 Effusion, unspecified knee: Secondary | ICD-10-CM

## 2015-02-01 DIAGNOSIS — R29898 Other symptoms and signs involving the musculoskeletal system: Secondary | ICD-10-CM

## 2015-02-01 DIAGNOSIS — R269 Unspecified abnormalities of gait and mobility: Secondary | ICD-10-CM

## 2015-02-01 NOTE — Therapy (Signed)
[facility] [address], [place], [place], [address] Phone: [phone]   Fax:  [phone]  Physical Therapy Treatment  Patient Details  Name: Jessica Bowers MRN: [id number] Date of Birth: 1950/08/16 No Data Recorded  Encounter Date: 02/01/2015      PT End of Session - 02/01/15 1255    Visit Number 19   Date for PT Re-Evaluation 02/14/15   PT Start Time 1230   PT Stop Time 1323   PT Time Calculation (min) 53 min   Activity Tolerance Patient tolerated treatment well   Behavior During Therapy [facility] for tasks assessed/performed      Past Medical History  Diagnosis Date  . Obesity   . Hyperlipidemia   . Diabetes ([place])   . HTN (hypertension)   . GERD (gastroesophageal reflux disease)   . CKD (chronic kidney disease)   . Patellar tendinitis   . LEG PAIN, LEFT   . Arthritis   . Varicose veins   . Varicose vein     bilat   . Bronchitis     hx of   . Shaking     hands  . Shingles     hx of   . History of chicken pox   . Measles     hx of   . Mumps     hx of     Past Surgical History  Procedure Laterality Date  . Tonsillectomy    . Lasik    . Knee surgery  2005    right  . Colonoscopy    . Abdominal hysterectomy  1997  . Excision haglund's deformity with achilles tendon repair Left 05/06/2013    Procedure: LEFT ACHILLES DEBRIDEMENT AND RESECTION / HAGLUND'S EXCISION;  Surgeon: Wylene Simmer, MD;  Location: [place];  Service: Orthopedics;  Laterality: Left;  . Gastrocnemius recession Left 05/06/2013    Procedure: LEFT GASTROC RECESSION;  Surgeon: Wylene Simmer, MD;  Location: [place];  Service: Orthopedics;  Laterality: Left;  . Carpal tunnel release Left 04/07/2014    Procedure: LEFT CARPAL TUNNEL RELEASE;  Surgeon: Daryll Brod, MD;  Location: [place];  Service: Orthopedics;  Laterality: Left;  . Varicose vein surgery    .  stab plebectomy       03/21/2014   . Total knee arthroplasty Right 11/28/2014    Procedure: TOTAL RIGHT KNEE ARTHROPLASTY;  Surgeon: Gaynelle Arabian, MD;  [facility];  Service: Orthopedics;  Laterality: Right;    There were no vitals filed for this visit.  Visit Diagnosis:  Knee stiffness, right  Weakness of right leg  Swelling of joint of lower leg  Abnormality of gait  Knee pain, acute, right          [facility] PT Assessment - 02/01/15 0001    AROM   Right Knee Extension 10   Right Knee Flexion 105                     [facility] Adult PT Treatment/Exercise - 02/01/15 0001    Knee/Hip Exercises: Aerobic   Stationary Bike reverse x 5 min  L0 then L1 x 5 min   Elliptical L1 x 2 min   Knee/Hip Exercises: Machines for Strengthening   Total Gym Leg Press Seat #80 30x, RTLE 40# 30x   Knee/Hip Exercises: Standing   Lateral Step Up Right;1 set;20 reps;Hand Hold: 1;Step Height: 6"   Forward Step Up Right;2 sets;10 reps;Hand Hold:  0;Step Height: 6"   Step Down Right;2 sets;10 reps;Hand Hold: 2;Step Height: 6"   Rocker Board 3 minutes   Rebounder 3 way weight shift 1 min  No UE support   Walking with Sports Cord 30# forward/reverse, backward/reverse x10  Added lateral 8x each 20#   Vasopneumatic   Number Minutes Vasopneumatic  15 minutes   Vasopnuematic Location  Knee   Vasopneumatic Pressure High   Vasopneumatic Temperature  3 snowflakes                  PT Short Term Goals - 01/30/15 1303    PT SHORT TERM GOAL #4   Title ascend steps with step-over-step gait pattern   Time 4   Status On-going           PT Long Term Goals - 01/27/15 1132    PT LONG TERM GOAL #3   Title wean from cane > or = to 50% of the time and demonstrate symmetry on level surface   Time 8   Period Weeks   Status Achieved   PT LONG TERM GOAL #4   Title report < or = to 3/10 Rt knee pain with ambulating in the community    Time 8   Period Weeks   Status Achieved  No greater than 3/10.                Plan - 02/01/15 1256    Clinical Impression Statement Full revolution on bike today due to increasewd ROM and decreased edema. Able to ascend stairs without UE. Added Eliptical trainer today, mostly cardiovascular. No problems with knee. Increased AROM to 105 degrees today.    Pt will benefit from skilled therapeutic intervention in order to improve on the following deficits Abnormal gait;Decreased range of motion;Difficulty walking;Pain;Increased edema;Decreased strength;Decreased scar mobility;Decreased activity tolerance;Decreased endurance   Rehab Potential Excellent   Clinical Impairments Affecting Rehab Potential None   PT Frequency 3x / week   PT Duration 8 weeks   PT Treatment/Interventions ADLs/Self Care Home Management;Cryotherapy;Electrical Stimulation;Moist Heat;Therapeutic exercise;Therapeutic activities;Functional mobility training;Stair training;Gait training;Ultrasound;Neuromuscular re-education;Patient/family education;Manual techniques;Vasopneumatic Device;Taping;Passive range of motion;Scar mobilization   PT Next Visit Plan Continue hip stretching, knee strength, edema managment   Consulted and Agree with Plan of Care Patient        Problem List Patient Active Problem List   Diagnosis Date Noted  . Chest pain 12/08/2014  . OA (osteoarthritis) of knee 11/28/2014  . Varicose veins of leg with complications 42/70/6237  . Varicose veins of lower extremities with other complications 62/83/1517  . Hyperlipidemia   . Diabetes ([place])   . HTN (hypertension)   . GERD (gastroesophageal reflux disease)   . CKD (chronic kidney disease)   . PATELLAR TENDINITIS 07/13/2009  . LEG PAIN, LEFT 07/13/2009    Makail Watling, PTA 02/01/2015, 1:09 PM  [facility] [address] [place], [place], [address] Phone: [phone]   Fax:  [phone]  Name: Jessica Bowers MRN: [id number] Date of Birth:  [birth year]

## 2015-02-03 ENCOUNTER — Encounter: Payer: 59 | Admitting: Physical Therapy

## 2015-02-06 ENCOUNTER — Ambulatory Visit: Payer: 59 | Admitting: Physical Therapy

## 2015-02-06 ENCOUNTER — Encounter: Payer: Self-pay | Admitting: Physical Therapy

## 2015-02-06 DIAGNOSIS — M25469 Effusion, unspecified knee: Secondary | ICD-10-CM

## 2015-02-06 DIAGNOSIS — M25561 Pain in right knee: Secondary | ICD-10-CM

## 2015-02-06 DIAGNOSIS — M25661 Stiffness of right knee, not elsewhere classified: Secondary | ICD-10-CM | POA: Diagnosis not present

## 2015-02-06 DIAGNOSIS — R29898 Other symptoms and signs involving the musculoskeletal system: Secondary | ICD-10-CM

## 2015-02-06 DIAGNOSIS — R269 Unspecified abnormalities of gait and mobility: Secondary | ICD-10-CM

## 2015-02-06 NOTE — Therapy (Signed)
[facility] [address], [place], [place], [address] Phone: [phone]   Fax:  [phone]  Physical Therapy Treatment  Patient Details  Name: Jessica Bowers MRN: [id number] Date of Birth: 09/18/1950 No Data Recorded  Encounter Date: 02/06/2015      PT End of Session - 02/06/15 1257    Visit Number 20   Date for PT Re-Evaluation 02/14/15   PT Start Time 1230   PT Stop Time 1324   PT Time Calculation (min) 54 min   Activity Tolerance Patient tolerated treatment well   Behavior During Therapy [facility] for tasks assessed/performed      Past Medical History  Diagnosis Date  . Obesity   . Hyperlipidemia   . Diabetes ([place])   . HTN (hypertension)   . GERD (gastroesophageal reflux disease)   . CKD (chronic kidney disease)   . Patellar tendinitis   . LEG PAIN, LEFT   . Arthritis   . Varicose veins   . Varicose vein     bilat   . Bronchitis     hx of   . Shaking     hands  . Shingles     hx of   . History of chicken pox   . Measles     hx of   . Mumps     hx of     Past Surgical History  Procedure Laterality Date  . Tonsillectomy    . Lasik    . Knee surgery  2005    right  . Colonoscopy    . Abdominal hysterectomy  1997  . Excision haglund's deformity with achilles tendon repair Left 05/06/2013    Procedure: LEFT ACHILLES DEBRIDEMENT AND RESECTION / HAGLUND'S EXCISION;  Surgeon: Wylene Simmer, MD;  Location: [place];  Service: Orthopedics;  Laterality: Left;  . Gastrocnemius recession Left 05/06/2013    Procedure: LEFT GASTROC RECESSION;  Surgeon: Wylene Simmer, MD;  Location: [place];  Service: Orthopedics;  Laterality: Left;  . Carpal tunnel release Left 04/07/2014    Procedure: LEFT CARPAL TUNNEL RELEASE;  Surgeon: Daryll Brod, MD;  Location: [place];  Service: Orthopedics;  Laterality: Left;  . Varicose vein surgery    .  stab plebectomy     03/21/2014   . Total knee arthroplasty Right 11/28/2014    Procedure: TOTAL RIGHT KNEE ARTHROPLASTY;  Surgeon: Gaynelle Arabian, MD;  [facility];  Service: Orthopedics;  Laterality: Right;    There were no vitals filed for this visit.  Visit Diagnosis:  Knee stiffness, right  Weakness of right leg  Swelling of joint of lower leg  Abnormality of gait  Knee pain, acute, right          [facility] PT Assessment - 02/06/15 0001    Observation/Other Assessments   Focus on Therapeutic Outcomes (FOTO)  49% limitation   AROM   Right Knee Extension 5   Right Knee Flexion 105                     [facility] Adult PT Treatment/Exercise - 02/06/15 0001    Knee/Hip Exercises: Aerobic   Stationary Bike 5 min rocking then 5 min L1   Knee/Hip Exercises: Machines for Strengthening   Total Gym Leg Press Seat #80 30x, RTLE 45# 30x   Knee/Hip Exercises: Standing   Lateral Step Up Right;1 set;20 reps;Hand Hold: 1;Step Height: 6"   Forward Step Up Right;2 sets;10 reps;Hand  Hold: 0;Step Height: 6"   Step Down Right;2 sets;10 reps;Hand Hold: 2;Step Height: 6"   Rocker Board 3 minutes   Rebounder 3 way weight shift 1 min  No UE support   Walking with Sports Cord 30# forward/reverse, backward/reverse x10  Added lateral 8x each 20#   Vasopneumatic   Number Minutes Vasopneumatic  15 minutes   Vasopnuematic Location  Knee   Vasopneumatic Pressure High   Vasopneumatic Temperature  3 snowflakes                  PT Short Term Goals - 01/30/15 1303    PT SHORT TERM GOAL #4   Title ascend steps with step-over-step gait pattern   Time 4   Status On-going           PT Long Term Goals - 02/06/15 1236    PT LONG TERM GOAL #1   Title be independent in advanced HEP   Time 8   Period Weeks   Status On-going               Plan - 02/06/15 1254    Clinical Impression Statement Pt to MD tomorrow. Edema continues to reduce as pt has returned to wearing her compression  garment, AROM continues to improve as well. Needs at least 1 rail to descend stairs due to eccentric weakness.    Pt will benefit from skilled therapeutic intervention in order to improve on the following deficits Abnormal gait;Decreased range of motion;Difficulty walking;Pain;Increased edema;Decreased strength;Decreased scar mobility;Decreased activity tolerance;Decreased endurance   Rehab Potential Excellent   Clinical Impairments Affecting Rehab Potential None   PT Frequency 3x / week   PT Duration 8 weeks   PT Treatment/Interventions ADLs/Self Care Home Management;Cryotherapy;Electrical Stimulation;Moist Heat;Therapeutic exercise;Therapeutic activities;Functional mobility training;Stair training;Gait training;Ultrasound;Neuromuscular re-education;Patient/family education;Manual techniques;Vasopneumatic Device;Taping;Passive range of motion;Scar mobilization   PT Next Visit Plan To MD   Consulted and Agree with Plan of Care Patient        Problem List Patient Active Problem List   Diagnosis Date Noted  . Chest pain 12/08/2014  . OA (osteoarthritis) of knee 11/28/2014  . Varicose veins of leg with complications 97/05/6376  . Varicose veins of lower extremities with other complications 58/85/0277  . Hyperlipidemia   . Diabetes ([place])   . HTN (hypertension)   . GERD (gastroesophageal reflux disease)   . CKD (chronic kidney disease)   . PATELLAR TENDINITIS 07/13/2009  . LEG PAIN, LEFT 07/13/2009    Rosenda Geffrard, PTA 02/06/2015, 3:26 PM  [facility] [address], [place], [place], [address] Phone: [phone]   Fax:  [phone]  Name: Jessica Bowers MRN: [id number] Date of Birth: [birth year]

## 2015-02-06 NOTE — Therapy (Signed)
[facility] [address] [place], [place], [address] Phone: [phone]   Fax:  [phone]  Physical Therapy Treatment  Patient Details  Name: Jessica Bowers MRN: [id number] Date of Birth: [birth year] No Data Recorded  Encounter Date: 02/06/2015      PT End of Session - 02/06/15 1257    Visit Number 20   Date for PT Re-Evaluation 02/14/15   PT Start Time 1230   PT Stop Time 1324   PT Time Calculation (min) 54 min   Activity Tolerance Patient tolerated treatment well   Behavior During Therapy [facility] for tasks assessed/performed      Past Medical History  Diagnosis Date  . Obesity   . Hyperlipidemia   . Diabetes ([place])   . HTN (hypertension)   . GERD (gastroesophageal reflux disease)   . CKD (chronic kidney disease)   . Patellar tendinitis   . LEG PAIN, LEFT   . Arthritis   . Varicose veins   . Varicose vein     bilat   . Bronchitis     hx of   . Shaking     hands  . Shingles     hx of   . History of chicken pox   . Measles     hx of   . Mumps     hx of     Past Surgical History  Procedure Laterality Date  . Tonsillectomy    . Lasik    . Knee surgery  2005    right  . Colonoscopy    . Abdominal hysterectomy  1997  . Excision haglund's deformity with achilles tendon repair Left 05/06/2013    Procedure: LEFT ACHILLES DEBRIDEMENT AND RESECTION / HAGLUND'S EXCISION;  Surgeon: Wylene Simmer, MD;  Location: [place];  Service: Orthopedics;  Laterality: Left;  . Gastrocnemius recession Left 05/06/2013    Procedure: LEFT GASTROC RECESSION;  Surgeon: Wylene Simmer, MD;  Location: [place];  Service: Orthopedics;  Laterality: Left;  . Carpal tunnel release Left 04/07/2014    Procedure: LEFT CARPAL TUNNEL RELEASE;  Surgeon: Daryll Brod, MD;  Location: [place];  Service: Orthopedics;  Laterality: Left;  . Varicose vein surgery    .  stab plebectomy     03/21/2014   . Total knee arthroplasty Right 11/28/2014    Procedure: TOTAL RIGHT KNEE ARTHROPLASTY;  Surgeon: Gaynelle Arabian, MD;  [facility];  Service: Orthopedics;  Laterality: Right;    There were no vitals filed for this visit.  Visit Diagnosis:  Knee stiffness, right  Weakness of right leg  Swelling of joint of lower leg  Abnormality of gait  Knee pain, acute, right          [facility] PT Assessment - 02/06/15 0001    AROM   Right Knee Extension 5   Right Knee Flexion 105                     [facility] Adult PT Treatment/Exercise - 02/06/15 0001    Knee/Hip Exercises: Aerobic   Stationary Bike 5 min rocking then 5 min L1   Knee/Hip Exercises: Machines for Strengthening   Total Gym Leg Press Seat #80 30x, RTLE 45# 30x   Knee/Hip Exercises: Standing   Lateral Step Up Right;1 set;20 reps;Hand Hold: 1;Step Height: 6"   Forward Step Up Right;2 sets;10 reps;Hand Hold: 0;Step Height: 6"   Step Down Right;2 sets;10 reps;Hand Hold: 2;Step Height:  6"   Rocker Board 3 minutes   Rebounder 3 way weight shift 1 min  No UE support   Walking with Sports Cord 30# forward/reverse, backward/reverse x10  Added lateral 8x each 20#   Vasopneumatic   Number Minutes Vasopneumatic  15 minutes   Vasopnuematic Location  Knee   Vasopneumatic Pressure High   Vasopneumatic Temperature  3 snowflakes                  PT Short Term Goals - 01/30/15 1303    PT SHORT TERM GOAL #4   Title ascend steps with step-over-step gait pattern   Time 4   Status On-going           PT Long Term Goals - 02/06/15 1236    PT LONG TERM GOAL #1   Title be independent in advanced HEP   Time 8   Period Weeks   Status On-going               Plan - 02/06/15 1254    Clinical Impression Statement Pt to MD tomorrow. Edema continues to reduce as pt has returned to wearing her compression garment, AROM continues to improve as well. Needs at least 1 rail to descend stairs due to  eccentric weakness.    Pt will benefit from skilled therapeutic intervention in order to improve on the following deficits Abnormal gait;Decreased range of motion;Difficulty walking;Pain;Increased edema;Decreased strength;Decreased scar mobility;Decreased activity tolerance;Decreased endurance   Rehab Potential Excellent   Clinical Impairments Affecting Rehab Potential None   PT Frequency 3x / week   PT Duration 8 weeks   PT Treatment/Interventions ADLs/Self Care Home Management;Cryotherapy;Electrical Stimulation;Moist Heat;Therapeutic exercise;Therapeutic activities;Functional mobility training;Stair training;Gait training;Ultrasound;Neuromuscular re-education;Patient/family education;Manual techniques;Vasopneumatic Device;Taping;Passive range of motion;Scar mobilization   PT Next Visit Plan To MD   Consulted and Agree with Plan of Care Patient        Problem List Patient Active Problem List   Diagnosis Date Noted  . Chest pain 12/08/2014  . OA (osteoarthritis) of knee 11/28/2014  . Varicose veins of leg with complications 34/19/6222  . Varicose veins of lower extremities with other complications 97/98/9211  . Hyperlipidemia   . Diabetes ([place])   . HTN (hypertension)   . GERD (gastroesophageal reflux disease)   . CKD (chronic kidney disease)   . PATELLAR TENDINITIS 07/13/2009  . LEG PAIN, LEFT 07/13/2009    Jamonte Curfman, PTA 02/06/2015, 1:15 PM  [facility] [address], [place], [place], [address] Phone: [phone]   Fax:  [phone]  Name: SHAUNETTE GASSNER MRN: [id number] Date of Birth: 24-Aug-1950

## 2015-02-08 ENCOUNTER — Ambulatory Visit: Payer: 59 | Attending: Orthopedic Surgery | Admitting: Physical Therapy

## 2015-02-08 DIAGNOSIS — M25469 Effusion, unspecified knee: Secondary | ICD-10-CM

## 2015-02-08 DIAGNOSIS — M25561 Pain in right knee: Secondary | ICD-10-CM | POA: Insufficient documentation

## 2015-02-08 DIAGNOSIS — M2548 Effusion, other site: Secondary | ICD-10-CM | POA: Insufficient documentation

## 2015-02-08 DIAGNOSIS — R269 Unspecified abnormalities of gait and mobility: Secondary | ICD-10-CM | POA: Insufficient documentation

## 2015-02-08 DIAGNOSIS — R29898 Other symptoms and signs involving the musculoskeletal system: Secondary | ICD-10-CM | POA: Diagnosis present

## 2015-02-08 DIAGNOSIS — M25661 Stiffness of right knee, not elsewhere classified: Secondary | ICD-10-CM | POA: Insufficient documentation

## 2015-02-08 NOTE — Therapy (Signed)
[facility] [address] [place], [place], [address] Phone: [phone]   Fax:  [phone]  Physical Therapy Treatment  Patient Details  Name: Jessica Bowers MRN: [id number] Date of Birth: Feb 22, 1951 No Data Recorded  Encounter Date: 02/08/2015      PT End of Session - 02/08/15 1241    Visit Number 21   Date for PT Re-Evaluation 02/14/15   PT Start Time 1236   PT Stop Time 1325   PT Time Calculation (min) 49 min   Activity Tolerance Patient tolerated treatment well   Behavior During Therapy [facility] for tasks assessed/performed      Past Medical History  Diagnosis Date  . Obesity   . Hyperlipidemia   . Diabetes ([place])   . HTN (hypertension)   . GERD (gastroesophageal reflux disease)   . CKD (chronic kidney disease)   . Patellar tendinitis   . LEG PAIN, LEFT   . Arthritis   . Varicose veins   . Varicose vein     bilat   . Bronchitis     hx of   . Shaking     hands  . Shingles     hx of   . History of chicken pox   . Measles     hx of   . Mumps     hx of     Past Surgical History  Procedure Laterality Date  . Tonsillectomy    . Lasik    . Knee surgery  2005    right  . Colonoscopy    . Abdominal hysterectomy  1997  . Excision haglund's deformity with achilles tendon repair Left 05/06/2013    Procedure: LEFT ACHILLES DEBRIDEMENT AND RESECTION / HAGLUND'S EXCISION;  Surgeon: Wylene Simmer, MD;  Location: [place];  Service: Orthopedics;  Laterality: Left;  . Gastrocnemius recession Left 05/06/2013    Procedure: LEFT GASTROC RECESSION;  Surgeon: Wylene Simmer, MD;  Location: [place];  Service: Orthopedics;  Laterality: Left;  . Carpal tunnel release Left 04/07/2014    Procedure: LEFT CARPAL TUNNEL RELEASE;  Surgeon: Daryll Brod, MD;  Location: [place];  Service: Orthopedics;  Laterality: Left;  . Varicose vein surgery    .  stab plebectomy     03/21/2014   . Total knee arthroplasty Right 11/28/2014    Procedure: TOTAL RIGHT KNEE ARTHROPLASTY;  Surgeon: Gaynelle Arabian, MD;  [facility];  Service: Orthopedics;  Laterality: Right;    There were no vitals filed for this visit.  Visit Diagnosis:  Knee stiffness, right  Weakness of right leg  Swelling of joint of lower leg  Abnormality of gait  Knee pain, acute, right      Subjective Assessment - 02/08/15 1247    Subjective I saw Dr. Wynelle Link and he released me to work for 02/20/2015.  He go 112 degrees of flexion.    Pertinent History Lt Achilles tendon repair 04/22/14   Limitations Sitting;Standing;Walking   How long can you stand comfortably? 15 minutes   How long can you walk comfortably? 10-15 minutes with use of cane   Patient Stated Goals wean from cane, increase ROM of knee, improve strength and endurance   Currently in Pain? No/denies   Multiple Pain Sites No                         [facility] Adult PT Treatment/Exercise - 02/08/15 0001  Knee/Hip Exercises: Aerobic   Stationary Bike 5 min rocking then 5 min L1   Knee/Hip Exercises: Machines for Strengthening   Total Gym Leg Press Seat #80 30x, RTLE 45# 30x   Knee/Hip Exercises: Standing   Lateral Step Up Right;1 set;20 reps;Hand Hold: 1;Step Height: 6"   Forward Step Up Right;2 sets;10 reps;Hand Hold: 0;Step Height: 6"   Step Down Right;2 sets;10 reps;Hand Hold: 2;Step Height: 6"   Rocker Board 3 minutes   Rebounder 3 way weight shift 1 min  No UE support   Walking with Sports Cord 30# forward/reverse, backward/reverse x10  Added lateral 8x each 20#   Vasopneumatic   Number Minutes Vasopneumatic  15 minutes   Vasopnuematic Location  Knee   Vasopneumatic Pressure High   Vasopneumatic Temperature  3 snowflakes                PT Education - 02/08/15 1316    Education provided No          PT Short Term Goals - 01/30/15 1303    PT SHORT TERM GOAL #4   Title ascend steps with  step-over-step gait pattern   Time 4   Status On-going           PT Long Term Goals - 02/06/15 1236    PT LONG TERM GOAL #1   Title be independent in advanced HEP   Time 8   Period Weeks   Status On-going               Plan - 02/08/15 1316    Clinical Impression Statement Patient is returning to work on 02/20/2015.  Patient had 112 degrees of right knee flexion with doctor.  Patient able to do exercises without increased pain. Paitent is able to ambulate with minimal to no deficits.  Patient will benefit from physical  therapy to increase right knee strength.    Pt will benefit from skilled therapeutic intervention in order to improve on the following deficits Abnormal gait;Decreased range of motion;Difficulty walking;Pain;Increased edema;Decreased strength;Decreased scar mobility;Decreased activity tolerance;Decreased endurance   Rehab Potential Excellent   Clinical Impairments Affecting Rehab Potential None   PT Frequency 3x / week   PT Duration 8 weeks   PT Treatment/Interventions ADLs/Self Care Home Management;Cryotherapy;Electrical Stimulation;Moist Heat;Therapeutic exercise;Therapeutic activities;Functional mobility training;Stair training;Gait training;Ultrasound;Neuromuscular re-education;Patient/family education;Manual techniques;Vasopneumatic Device;Taping;Passive range of motion;Scar mobilization   PT Next Visit Plan right knee strength and ROM; Discharge on 02/14/2015   PT Home Exercise Plan progress as needed   Consulted and Agree with Plan of Care Patient        Problem List Patient Active Problem List   Diagnosis Date Noted  . Chest pain 12/08/2014  . OA (osteoarthritis) of knee 11/28/2014  . Varicose veins of leg with complications 25/08/3974  . Varicose veins of lower extremities with other complications 73/41/9379  . Hyperlipidemia   . Diabetes ([place])   . HTN (hypertension)   . GERD (gastroesophageal reflux disease)   . CKD (chronic kidney disease)    . PATELLAR TENDINITIS 07/13/2009  . LEG PAIN, LEFT 07/13/2009    GRAY,CHERYL,PT 02/08/2015, 1:19 PM  [facility] [address] [place], [place], [address] Phone: [phone]   Fax:  [phone]  Name: Jessica Bowers MRN: [id number] Date of Birth: [birth year]

## 2015-02-10 ENCOUNTER — Encounter: Payer: Self-pay | Admitting: Physical Therapy

## 2015-02-10 ENCOUNTER — Ambulatory Visit: Payer: 59 | Admitting: Physical Therapy

## 2015-02-10 DIAGNOSIS — M25661 Stiffness of right knee, not elsewhere classified: Secondary | ICD-10-CM

## 2015-02-10 DIAGNOSIS — R269 Unspecified abnormalities of gait and mobility: Secondary | ICD-10-CM

## 2015-02-10 DIAGNOSIS — R29898 Other symptoms and signs involving the musculoskeletal system: Secondary | ICD-10-CM

## 2015-02-10 DIAGNOSIS — M25469 Effusion, unspecified knee: Secondary | ICD-10-CM

## 2015-02-10 DIAGNOSIS — M25561 Pain in right knee: Secondary | ICD-10-CM

## 2015-02-10 NOTE — Therapy (Signed)
[facility] [address] [place], [place], [address] Phone: [phone]   Fax:  [phone]  Physical Therapy Treatment  Patient Details  Name: Jessica Bowers MRN: [id number] Date of Birth: [birth year] No Data Recorded  Encounter Date: 02/10/2015      PT End of Session - 02/10/15 1140    Visit Number 22   Date for PT Re-Evaluation 02/14/15   PT Start Time 1055   PT Stop Time 1150   PT Time Calculation (min) 55 min   Activity Tolerance Patient tolerated treatment well   Behavior During Therapy [facility] for tasks assessed/performed      Past Medical History  Diagnosis Date  . Obesity   . Hyperlipidemia   . Diabetes ([place])   . HTN (hypertension)   . GERD (gastroesophageal reflux disease)   . CKD (chronic kidney disease)   . Patellar tendinitis   . LEG PAIN, LEFT   . Arthritis   . Varicose veins   . Varicose vein     bilat   . Bronchitis     hx of   . Shaking     hands  . Shingles     hx of   . History of chicken pox   . Measles     hx of   . Mumps     hx of     Past Surgical History  Procedure Laterality Date  . Tonsillectomy    . Lasik    . Knee surgery  2005    right  . Colonoscopy    . Abdominal hysterectomy  1997  . Excision haglund's deformity with achilles tendon repair Left 05/06/2013    Procedure: LEFT ACHILLES DEBRIDEMENT AND RESECTION / HAGLUND'S EXCISION;  Surgeon: Wylene Simmer, MD;  Location: [place];  Service: Orthopedics;  Laterality: Left;  . Gastrocnemius recession Left 05/06/2013    Procedure: LEFT GASTROC RECESSION;  Surgeon: Wylene Simmer, MD;  Location: [place];  Service: Orthopedics;  Laterality: Left;  . Carpal tunnel release Left 04/07/2014    Procedure: LEFT CARPAL TUNNEL RELEASE;  Surgeon: Daryll Brod, MD;  Location: [place];  Service: Orthopedics;  Laterality: Left;  . Varicose vein surgery    .  stab plebectomy     03/21/2014   . Total knee arthroplasty Right 11/28/2014    Procedure: TOTAL RIGHT KNEE ARTHROPLASTY;  Surgeon: Gaynelle Arabian, MD;  [facility];  Service: Orthopedics;  Laterality: Right;    There were no vitals filed for this visit.  Visit Diagnosis:  Knee stiffness, right  Weakness of right leg  Swelling of joint of lower leg  Knee pain, acute, right  Abnormality of gait      Subjective Assessment - 02/10/15 1057    Subjective Knee feeling good today.    Currently in Pain? No/denies                         [facility] Adult PT Treatment/Exercise - 02/10/15 0001    Knee/Hip Exercises: Aerobic   Stationary Bike 8 min, initially rocking    Nustep L7 x 6 min   Knee/Hip Exercises: Machines for Strengthening   Total Gym Leg Press Seat #7 80# 30x bil, 45# RTLE 30X   Knee/Hip Exercises: Standing   Lateral Step Up Right;1 set;20 reps;Hand Hold: 1;Step Height: 6"   Forward Step Up Right;2 sets;10 reps;Hand Hold: 0;Step Height: 6"   Step Down Right;2  sets;10 reps;Hand Hold: 2;Step Height: 6"   Rocker Board 3 minutes   Rebounder 3 way weight shift 1 min  No UE support   Walking with Sports Cord 30# forward/reverse, backward/reverse x10  4 ways 10 x each 30#   Vasopneumatic   Number Minutes Vasopneumatic  15 minutes   Vasopnuematic Location  Knee   Vasopneumatic Pressure High   Vasopneumatic Temperature  3 snowflakes                  PT Short Term Goals - 01/30/15 1303    PT SHORT TERM GOAL #4   Title ascend steps with step-over-step gait pattern   Time 4   Status On-going           PT Long Term Goals - 02/06/15 1236    PT LONG TERM GOAL #1   Title be independent in advanced HEP   Time 8   Period Weeks   Status On-going               Plan - 02/10/15 1140    Clinical Impression Statement Continues to work on her strength in PT, felt like all her weights/resistance was adequate, still challenging. Held a good pace on the Nustep  today.    Pt will benefit from skilled therapeutic intervention in order to improve on the following deficits Abnormal gait;Decreased range of motion;Difficulty walking;Pain;Increased edema;Decreased strength;Decreased scar mobility;Decreased activity tolerance;Decreased endurance   Rehab Potential Excellent   Clinical Impairments Affecting Rehab Potential None   PT Frequency 3x / week   PT Duration 8 weeks   PT Treatment/Interventions ADLs/Self Care Home Management;Cryotherapy;Electrical Stimulation;Moist Heat;Therapeutic exercise;Therapeutic activities;Functional mobility training;Stair training;Gait training;Ultrasound;Neuromuscular re-education;Patient/family education;Manual techniques;Vasopneumatic Device;Taping;Passive range of motion;Scar mobilization   PT Next Visit Plan right knee strength and ROM; Discharge on 02/14/2015   Consulted and Agree with Plan of Care Patient        Problem List Patient Active Problem List   Diagnosis Date Noted  . Chest pain 12/08/2014  . OA (osteoarthritis) of knee 11/28/2014  . Varicose veins of leg with complications 13/11/6576  . Varicose veins of lower extremities with other complications 46/96/2952  . Hyperlipidemia   . Diabetes ([place])   . HTN (hypertension)   . GERD (gastroesophageal reflux disease)   . CKD (chronic kidney disease)   . PATELLAR TENDINITIS 07/13/2009  . LEG PAIN, LEFT 07/13/2009    Jibri Schriefer, PTA 02/10/2015, 11:42 AM  [facility] [address], [place], [place], [address] Phone: [phone]   Fax:  [phone]  Name: Jessica Bowers MRN: [id number] Date of Birth: November 19, 1950

## 2015-02-13 ENCOUNTER — Ambulatory Visit: Payer: 59

## 2015-02-13 DIAGNOSIS — M25661 Stiffness of right knee, not elsewhere classified: Secondary | ICD-10-CM

## 2015-02-13 DIAGNOSIS — R269 Unspecified abnormalities of gait and mobility: Secondary | ICD-10-CM

## 2015-02-13 DIAGNOSIS — M25561 Pain in right knee: Secondary | ICD-10-CM

## 2015-02-13 DIAGNOSIS — M25469 Effusion, unspecified knee: Secondary | ICD-10-CM

## 2015-02-13 DIAGNOSIS — R29898 Other symptoms and signs involving the musculoskeletal system: Secondary | ICD-10-CM

## 2015-02-13 NOTE — Therapy (Signed)
[facility] [address] [place], [place], [address] Phone: [phone]   Fax:  [phone]  Physical Therapy Treatment  Patient Details  Name: Jessica Bowers MRN: [id number] Date of Birth: [birth year] Referring Provider: Gaynelle Arabian, MD  Encounter Date: 02/13/2015      PT End of Session - 02/13/15 1514    Visit Number 23   Date for PT Re-Evaluation 02/14/15   PT Start Time 1432   PT Stop Time 1529   PT Time Calculation (min) 57 min   Activity Tolerance Patient tolerated treatment well   Behavior During Therapy [facility] for tasks assessed/performed      Past Medical History  Diagnosis Date  . Obesity   . Hyperlipidemia   . Diabetes ([place])   . HTN (hypertension)   . GERD (gastroesophageal reflux disease)   . CKD (chronic kidney disease)   . Patellar tendinitis   . LEG PAIN, LEFT   . Arthritis   . Varicose veins   . Varicose vein     bilat   . Bronchitis     hx of   . Shaking     hands  . Shingles     hx of   . History of chicken pox   . Measles     hx of   . Mumps     hx of     Past Surgical History  Procedure Laterality Date  . Tonsillectomy    . Lasik    . Knee surgery  2005    right  . Colonoscopy    . Abdominal hysterectomy  1997  . Excision haglund's deformity with achilles tendon repair Left 05/06/2013    Procedure: LEFT ACHILLES DEBRIDEMENT AND RESECTION / HAGLUND'S EXCISION;  Surgeon: Wylene Simmer, MD;  Location: [place];  Service: Orthopedics;  Laterality: Left;  . Gastrocnemius recession Left 05/06/2013    Procedure: LEFT GASTROC RECESSION;  Surgeon: Wylene Simmer, MD;  Location: [place];  Service: Orthopedics;  Laterality: Left;  . Carpal tunnel release Left 04/07/2014    Procedure: LEFT CARPAL TUNNEL RELEASE;  Surgeon: Daryll Brod, MD;  Location: [place];  Service: Orthopedics;  Laterality: Left;  . Varicose vein surgery    .  stab  plebectomy      03/21/2014   . Total knee arthroplasty Right 11/28/2014    Procedure: TOTAL RIGHT KNEE ARTHROPLASTY;  Surgeon: Gaynelle Arabian, MD;  [facility];  Service: Orthopedics;  Laterality: Right;    There were no vitals filed for this visit.  Visit Diagnosis:  Weakness of right leg  Knee stiffness, right  Swelling of joint of lower leg  Abnormality of gait  Knee pain, acute, right          [facility] PT Assessment - 02/13/15 0001    Assessment   Referring Provider Gaynelle Arabian, MD   Observation/Other Assessments   Focus on Therapeutic Outcomes (FOTO)  49% limitation                     [facility] Adult PT Treatment/Exercise - 02/13/15 0001    Knee/Hip Exercises: Aerobic   Stationary Bike 5 min, initially rocking    Nustep L7 x 10 min   Knee/Hip Exercises: Machines for Strengthening   Total Gym Leg Press Seat #7 80# 30x bil, 45# RTLE 30X   Knee/Hip Exercises: Standing   Lateral Step Up Right;1 set;20 reps;Hand Hold: 1;Step Height: 6"  Forward Step Up Right;2 sets;10 reps;Hand Hold: 0;Step Height: 6"   Step Down Right;2 sets;10 reps;Hand Hold: 2;Step Height: 6"   Rocker Board 3 minutes   Rebounder 3 way weight shift 1 min  No UE support   Walking with Sports Cord 30# forward/reverse, backward/reverse x10  4 ways 10 x each 30#   Vasopneumatic   Number Minutes Vasopneumatic  15 minutes   Vasopnuematic Location  Knee   Vasopneumatic Pressure High   Vasopneumatic Temperature  3 snowflakes                  PT Short Term Goals - 01/30/15 1303    PT SHORT TERM GOAL #4   Title ascend steps with step-over-step gait pattern   Time 4   Status On-going           PT Long Term Goals - 02/13/15 1449    PT LONG TERM GOAL #1   Title be independent in advanced HEP   Time 8   Period Weeks   Status On-going   PT LONG TERM GOAL #2   Title reduce FOTO to < or = to 45% limitation   Time 8   Period Weeks   Status On-going   PT LONG TERM GOAL  #3   Title wean from cane > or = to 50% of the time and demonstrate symmetry on level surface   Status Achieved   PT LONG TERM GOAL #4   Title report < or = to 3/10 Rt knee pain with ambulating in the community    Status Achieved   PT LONG TERM GOAL #5   Title ascend and descend steps with step-over-step gait > 60% of the time   Time 8   Period Weeks   Status On-going               Plan - 02/13/15 1451    Clinical Impression Statement Pt will discharge tomorrow and plans to return to work 02/20/15.  Pt was able to walk 1 hour in the community without rest.  Pt denies any pain. Pt ascends steps with step-over-step gait most of the time and descends that way <50% of the time due to fear/instability.  PT will finalize HEP next session and measure ROM/MMT.   PT Frequency 3x / week   PT Duration 8 weeks   PT Treatment/Interventions ADLs/Self Care Home Management;Cryotherapy;Electrical Stimulation;Moist Heat;Therapeutic exercise;Therapeutic activities;Functional mobility training;Stair training;Gait training;Ultrasound;Neuromuscular re-education;Patient/family education;Manual techniques;Vasopneumatic Device;Taping;Passive range of motion;Scar mobilization   PT Next Visit Plan right knee strength and ROM; Discharge on 02/14/2015.  Measure ROM and strength   Consulted and Agree with Plan of Care Patient        Problem List Patient Active Problem List   Diagnosis Date Noted  . Chest pain 12/08/2014  . OA (osteoarthritis) of knee 11/28/2014  . Varicose veins of leg with complications 17/49/4496  . Varicose veins of lower extremities with other complications 75/91/6384  . Hyperlipidemia   . Diabetes ([place])   . HTN (hypertension)   . GERD (gastroesophageal reflux disease)   . CKD (chronic kidney disease)   . PATELLAR TENDINITIS 07/13/2009  . LEG PAIN, LEFT 07/13/2009    TAKACS,KELLY, PT 02/13/2015, 3:18 PM  [facility] [address] [place], [place], [address] Phone: [phone]   Fax:  [phone]  Name: Jessica Bowers MRN: [id number] Date of Birth: 1950/12/18

## 2015-02-14 ENCOUNTER — Ambulatory Visit: Payer: 59

## 2015-02-14 DIAGNOSIS — M25661 Stiffness of right knee, not elsewhere classified: Secondary | ICD-10-CM

## 2015-02-14 DIAGNOSIS — R269 Unspecified abnormalities of gait and mobility: Secondary | ICD-10-CM

## 2015-02-14 DIAGNOSIS — R29898 Other symptoms and signs involving the musculoskeletal system: Secondary | ICD-10-CM

## 2015-02-14 DIAGNOSIS — M25469 Effusion, unspecified knee: Secondary | ICD-10-CM

## 2015-02-14 NOTE — Therapy (Signed)
[facility] [address], [place], [place], [address] Phone: [phone]   Fax:  [phone]  Physical Therapy Treatment  Patient Details  Name: Jessica Bowers MRN: [id number] Date of Birth: 1950/11/08 Referring Provider: Gaynelle Arabian, MD  Encounter Date: 02/14/2015      PT End of Session - 02/14/15 1054    Visit Number 24   Date for PT Re-Evaluation 02/14/15   PT Start Time 1016   PT Stop Time 1108   PT Time Calculation (min) 52 min   Activity Tolerance Patient tolerated treatment well   Behavior During Therapy [facility] for tasks assessed/performed      Past Medical History  Diagnosis Date  . Obesity   . Hyperlipidemia   . Diabetes ([place])   . HTN (hypertension)   . GERD (gastroesophageal reflux disease)   . CKD (chronic kidney disease)   . Patellar tendinitis   . LEG PAIN, LEFT   . Arthritis   . Varicose veins   . Varicose vein     bilat   . Bronchitis     hx of   . Shaking     hands  . Shingles     hx of   . History of chicken pox   . Measles     hx of   . Mumps     hx of     Past Surgical History  Procedure Laterality Date  . Tonsillectomy    . Lasik    . Knee surgery  2005    right  . Colonoscopy    . Abdominal hysterectomy  1997  . Excision haglund's deformity with achilles tendon repair Left 05/06/2013    Procedure: LEFT ACHILLES DEBRIDEMENT AND RESECTION / HAGLUND'S EXCISION;  Surgeon: Wylene Simmer, MD;  Location: [place];  Service: Orthopedics;  Laterality: Left;  . Gastrocnemius recession Left 05/06/2013    Procedure: LEFT GASTROC RECESSION;  Surgeon: Wylene Simmer, MD;  Location: [place];  Service: Orthopedics;  Laterality: Left;  . Carpal tunnel release Left 04/07/2014    Procedure: LEFT CARPAL TUNNEL RELEASE;  Surgeon: Daryll Brod, MD;  Location: [place];  Service: Orthopedics;  Laterality: Left;  . Varicose vein surgery    .  stab  plebectomy      03/21/2014   . Total knee arthroplasty Right 11/28/2014    Procedure: TOTAL RIGHT KNEE ARTHROPLASTY;  Surgeon: Gaynelle Arabian, MD;  [facility];  Service: Orthopedics;  Laterality: Right;    There were no vitals filed for this visit.  Visit Diagnosis:  Weakness of right leg  Knee stiffness, right  Swelling of joint of lower leg  Abnormality of gait      Subjective Assessment - 02/14/15 1013    Subjective Ready for D/C.  going back to work 02/20/15.   Currently in Pain? No/denies            [facility] PT Assessment - 02/14/15 0001    Assessment   Medical Diagnosis s/p Total knee replacement (M42.683)   Onset Date/Surgical Date 11/28/14   Observation/Other Assessments   Focus on Therapeutic Outcomes (FOTO)  34% limitation   AROM   Right Knee Extension 4   Right Knee Flexion 101   Strength   Overall Strength Deficits   Right/Left Hip Right   Right Hip Flexion 4+/5   Right/Left Knee Right   Right Knee Flexion 4+/5   Right Knee Extension 5/5  [place] Adult PT Treatment/Exercise - 02/14/15 0001    Knee/Hip Exercises: Aerobic   Stationary Bike Level 2 x 5 minutes   Knee/Hip Exercises: Machines for Strengthening   Total Gym Leg Press Seat #7 80# 30x bil, 45# Rt LE 30X   Knee/Hip Exercises: Standing   Lateral Step Up Right;1 set;20 reps;Hand Hold: 1;Step Height: 6"   Forward Step Up Right;2 sets;10 reps;Hand Hold: 0;Step Height: 6"   Step Down Right;2 sets;10 reps;Hand Hold: 2;Step Height: 6"   Rocker Board 3 minutes   Rebounder 3 way weight shift 1 min  No UE support   Walking with Sports Cord 30# forward/reverse, backward/reverse x10  4 ways 10 x each 30#   Vasopneumatic   Number Minutes Vasopneumatic  15 minutes   Vasopnuematic Location  Knee   Vasopneumatic Pressure High   Vasopneumatic Temperature  3 snowflakes                  PT Short Term Goals - 01/30/15 1303    PT SHORT TERM GOAL #4   Title  ascend steps with step-over-step gait pattern   Time 4   Status On-going           PT Long Term Goals - 02/14/15 1014    PT LONG TERM GOAL #1   Title be independent in advanced HEP   Status Achieved   PT LONG TERM GOAL #2   Title reduce FOTO to < or = to 45% limitation   Status Achieved   PT LONG TERM GOAL #3   Title wean from cane > or = to 50% of the time and demonstrate symmetry on level surface   Status Achieved   PT LONG TERM GOAL #4   Title report < or = to 3/10 Rt knee pain with ambulating in the community    Status Achieved   PT LONG TERM GOAL #5   Title ascend and descend steps with step-over-step gait > 60% of the time   Status Partially Met  still taking 1 step at a time with descending due to instability   PT LONG TERM GOAL #6   Title demonstrate 4+/5 Rt hip and knee strength to improve endurance for community ambulation and return to work   Status Achieved   PT LONG TERM GOAL #7   Title demonstrate Rt knee flexion to > or = to 110 degrees to allow for squatting and steps without limitation   Status Partially Met  101 degrees               Plan - 02/14/15 1036    Clinical Impression Statement Pt will be discharged to HEP today as she will return to work next week.  Pt was able to walk 1 hour in the community without rest.  Pt denies any pain.  FOTO score is 34% limitaiton.  Pt with continued difficulty with descending steps due to fear and instability.  Pt with Rt knee AROM flexion to 101 degress (measures in supine).  Pt will continue with HEP for strength and ROM gains.     PT Next Visit Plan D/C PT to HEP        Problem List Patient Active Problem List   Diagnosis Date Noted  . Chest pain 12/08/2014  . OA (osteoarthritis) of knee 11/28/2014  . Varicose veins of leg with complications 48/18/5631  . Varicose veins of lower extremities with other complications 49/70/2637  . Hyperlipidemia   . Diabetes ([place])   .  HTN (hypertension)   . GERD  (gastroesophageal reflux disease)   . CKD (chronic kidney disease)   . PATELLAR TENDINITIS 07/13/2009  . LEG PAIN, LEFT 07/13/2009   PHYSICAL THERAPY DISCHARGE SUMMARY  Visits from Start of Care: 24  Current functional level related to goals / functional outcomes: See above for current status.   Remaining deficits: Limited Rt knee AROM, mild weakness in the Rt LE.  Pt has HEP in place for continued gains.     Education / Equipment: HEP Plan: Patient agrees to discharge.  Patient goals were partially met. Patient is being discharged due to meeting the stated rehab goals.  ?????     TAKACS,KELLY, PT 02/14/2015, 10:56 AM  [facility] [address], [place], [place], [address] Phone: [phone]   Fax:  [phone]  Name: TEQUISHA MAAHS MRN: [id number] Date of Birth: 12-18-1950

## 2015-02-20 ENCOUNTER — Other Ambulatory Visit: Payer: 59 | Admitting: Vascular Surgery

## 2015-03-13 ENCOUNTER — Ambulatory Visit (INDEPENDENT_AMBULATORY_CARE_PROVIDER_SITE_OTHER): Payer: 59 | Admitting: Cardiology

## 2015-03-13 ENCOUNTER — Encounter: Payer: Self-pay | Admitting: Cardiology

## 2015-03-13 VITALS — BP 132/74 | HR 82 | Ht 66.0 in | Wt 248.1 lb

## 2015-03-13 DIAGNOSIS — E785 Hyperlipidemia, unspecified: Secondary | ICD-10-CM | POA: Diagnosis not present

## 2015-03-13 DIAGNOSIS — E669 Obesity, unspecified: Secondary | ICD-10-CM | POA: Diagnosis not present

## 2015-03-13 DIAGNOSIS — M1712 Unilateral primary osteoarthritis, left knee: Secondary | ICD-10-CM

## 2015-03-13 DIAGNOSIS — R0789 Other chest pain: Secondary | ICD-10-CM | POA: Diagnosis not present

## 2015-03-13 MED ORDER — ATORVASTATIN CALCIUM 20 MG PO TABS: 20.0000 mg | ORAL_TABLET | Freq: Every day | ORAL | Status: DC

## 2015-03-13 NOTE — Progress Notes (Signed)
Cardiology Office Note   Date:  03/13/2015   ID:  Jessica Bowers, DOB [birth year], MRN [id number]  PCP:  Tawanna Solo, MD  Cardiologist:   Candee Furbish, MD      History of Present Illness: Jessica Bowers is a 63 y.o. 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 Negative troponin.   At prior visit with Rosaria Ferries, PA she was expressing anger that she was left nothing by mouth until afternoon without any study ordered in that she was given insulin and she is not on this at home. Rhonda explained sliding scale and nothing by mouth and she understood.  At that visit on 12/21/14 her symptom has resolved. No more episodes of chest pain. No new dyspnea on exertion. She recovered from her knee surgery with complicated PT.  Since she had been chest pain-free, no further nuclear stress test was ordered. Still feels well.   Taking meds.    Past Medical History  Diagnosis Date  . Obesity   . Hyperlipidemia   . Diabetes ([place])   . HTN (hypertension)   . GERD (gastroesophageal reflux disease)   . CKD (chronic kidney disease)   . Patellar tendinitis   . LEG PAIN, LEFT   . Arthritis   . Varicose veins   . Varicose vein     bilat   . Bronchitis     hx of   . Shaking     hands  . Shingles     hx of   . History of chicken pox   . Measles     hx of   . Mumps     hx of     Past Surgical History  Procedure Laterality Date  . Tonsillectomy    . Lasik    . Knee surgery  2005    right  . Colonoscopy    . Abdominal hysterectomy  1997  . Excision haglund's deformity with achilles tendon repair Left 05/06/2013    Procedure: LEFT ACHILLES DEBRIDEMENT AND RESECTION / HAGLUND'S EXCISION;  Surgeon: Wylene Simmer, MD;  Location: [place];  Service: Orthopedics;  Laterality: Left;  . Gastrocnemius recession Left 05/06/2013    Procedure: LEFT GASTROC RECESSION;  Surgeon: Wylene Simmer, MD;  Location: [place];  Service: Orthopedics;  Laterality: Left;  . Carpal tunnel release Left 04/07/2014    Procedure: LEFT CARPAL TUNNEL RELEASE;  Surgeon: Daryll Brod, MD;  Location: [facility];  Service: Orthopedics;  Laterality: Left;  . Varicose vein surgery    .  stab plebectomy      03/21/2014   . Total knee arthroplasty Right 11/28/2014    Procedure: TOTAL RIGHT KNEE ARTHROPLASTY;  Surgeon: Gaynelle Arabian, MD;  [facility];  Service: Orthopedics;  Laterality: Right;     Current Outpatient Prescriptions  Medication Sig Dispense Refill  . amoxicillin (AMOXIL) 500 MG capsule Take 500 mg by mouth 2 (two) times daily. FOR DENTAL APPT.  0  . aspirin 81 MG tablet Take 81 mg by mouth daily.    Marland Kitchen atenolol (TENORMIN) 50 MG tablet Take 50 mg by mouth every morning.     Marland Kitchen atorvastatin (LIPITOR) 10 MG tablet Take 10 mg by mouth every morning.     Marland Kitchen  losartan (COZAAR) 100 MG tablet Take 100 mg by mouth every morning.     Marland Kitchen omega-3 acid ethyl esters (LOVAZA) 1 G capsule Take by mouth 2 (two) times daily.    Marland Kitchen oxybutynin (DITROPAN-XL) 10 MG 24 hr tablet Take 10 mg by mouth every morning.     . polyvinyl alcohol (LIQUIFILM TEARS) 1.4 % ophthalmic solution Place 1 drop into both eyes 2 (two) times daily.    . sitaGLIPtin (JANUVIA) 25 MG tablet Take 25 mg by mouth every morning.     . vitamin B-12 (CYANOCOBALAMIN) 100 MCG tablet Take 100 mcg by mouth daily.    Marland Kitchen JANUVIA 100 MG tablet   3   No current facility-administered medications for this visit.    Allergies:   Ace inhibitors; Crestor; Ezetimibe; Glucophage; Metformin and related; Nsaids; Pioglitazone; Vasotec; and Decadron    Social History:  The patient  reports that she has never smoked. She has never used  smokeless tobacco. She reports that she drinks alcohol. She reports that she does not use illicit drugs.   Family History:  The patient's family history includes CAD in her mother; Diabetes in her father; Heart attack in her mother; Hyperlipidemia in her mother; Hypertension in her father; Stroke in her mother.    ROS:  Please see the history of present illness.   Otherwise, review of systems are positive for none.   All other systems are reviewed and negative.    PHYSICAL EXAM: VS:  BP 132/74 mmHg  Pulse 82  Ht 5\' 6"  (1.676 m)  Wt 248 lb 1.9 oz (112.546 kg)  BMI 40.07 kg/m2  SpO2 96% , BMI Body mass index is 40.07 kg/(m^2). GEN: Well nourished, well developed, in no acute distress HEENT: normal Neck: no JVD, carotid bruits, or masses Cardiac: RRR; no murmurs, rubs, or gallops,no edema  Respiratory:  clear to auscultation bilaterally, normal work of breathing GI: soft, nontender, nondistended, + BS MS: no deformity or atrophy Skin: warm and dry, no rash Neuro:  Strength and sensation are intact Psych: euthymic mood, full affect   EKG: No ECG today   Recent Labs: 11/15/2014: ALT 37 12/09/2014: BUN 12; Creatinine, Ser 0.76; Hemoglobin 10.1*; Platelets 288; Potassium 3.8; Sodium 136    Lipid Panel    Component Value Date/Time   CHOL 142 08/25/2013 0950   TRIG 54.0 08/25/2013 0950   HDL 69.50 08/25/2013 0950   CHOLHDL 2 08/25/2013 0950   VLDL 10.8 08/25/2013 0950   LDLCALC 62 08/25/2013 0950      Wt Readings from Last 3 Encounters:  03/13/15 248 lb 1.9 oz (112.546 kg)  12/21/14 240 lb (108.863 kg)  12/08/14 241 lb 12.8 oz (109.68 kg)      Other studies Reviewed: Additional studies/ records that were reviewed today include: labs, office visits, hospital records. Review of the above records demonstrates: as above   ASSESSMENT AND PLAN:  Atypical chest pain  - Resolved, doing better.  - I do not believe that further testing is warranted at this time. Continue with  aggressive secondary/primary prevention  Diabetes mellitus without insulin  - Januvia, weight loss  Obesity  - Weight Loss  Hyperlipidemia  - Continue statin therapy, however we will increase atorvastatin to 20 mg. Her last LDL was 76 in July 2016. Remember, diabetes is a coronary disease equivalent. She was concerned about possible leg cramps with this medication increase. Continue to monitor. She has tolerated atorvastatin 10 mg very well.  Knee replacement - Physical therapy  exercises.  We once again went over her hospital experience, lack of communication, waiting nothing by mouth for a test etc. Apologized for the inconvenience.   Current medicines are reviewed at length with the patient today.  The patient does not have concerns regarding medicines.  The following changes have been made:  atrova 20  Labs/ tests ordered today include:    Disposition:   FU with Kalis Friese 6 months  Signed, Candee Furbish, MD  03/13/2015 8:41 AM    [facility] [address], [place], [place]  [address] Phone: [phone]; Fax: [phone]

## 2015-03-13 NOTE — Patient Instructions (Signed)
Medication Instructions:  Increase Atorvastatin to 20 mg daily-all other medications remain the same  Labwork: None  Testing/Procedures: None  Follow-Up: Your physician wants you to follow-up in: 6 months. You will receive a reminder letter in the mail two months in advance. If you don't receive a letter, please call our office to schedule the follow-up appointment.        If you need a refill on your cardiac medications before your next appointment, please call your pharmacy.

## 2015-03-22 ENCOUNTER — Ambulatory Visit: Payer: 59 | Admitting: Cardiology

## 2015-04-06 ENCOUNTER — Other Ambulatory Visit (HOSPITAL_BASED_OUTPATIENT_CLINIC_OR_DEPARTMENT_OTHER): Payer: Self-pay | Admitting: Obstetrics and Gynecology

## 2015-04-06 ENCOUNTER — Ambulatory Visit (HOSPITAL_BASED_OUTPATIENT_CLINIC_OR_DEPARTMENT_OTHER)
Admission: RE | Admit: 2015-04-06 | Discharge: 2015-04-06 | Disposition: A | Discharge status: Home / Self Care | Payer: 59 | Source: Ambulatory Visit | Attending: Obstetrics and Gynecology | Admitting: Obstetrics and Gynecology

## 2015-04-06 DIAGNOSIS — R928 Other abnormal and inconclusive findings on diagnostic imaging of breast: Secondary | ICD-10-CM | POA: Diagnosis not present

## 2015-04-06 DIAGNOSIS — Z1231 Encounter for screening mammogram for malignant neoplasm of breast: Secondary | ICD-10-CM

## 2015-04-11 ENCOUNTER — Other Ambulatory Visit: Payer: Self-pay | Admitting: Obstetrics and Gynecology

## 2015-04-11 DIAGNOSIS — R928 Other abnormal and inconclusive findings on diagnostic imaging of breast: Secondary | ICD-10-CM

## 2015-04-12 ENCOUNTER — Other Ambulatory Visit: Payer: 59

## 2015-04-14 ENCOUNTER — Ambulatory Visit
Admission: RE | Admit: 2015-04-14 | Discharge: 2015-04-14 | Disposition: A | Discharge status: Home / Self Care | Payer: 59 | Source: Ambulatory Visit | Attending: Obstetrics and Gynecology | Admitting: Obstetrics and Gynecology

## 2015-04-14 DIAGNOSIS — R928 Other abnormal and inconclusive findings on diagnostic imaging of breast: Secondary | ICD-10-CM

## 2015-04-14 DIAGNOSIS — N63 Unspecified lump in breast: Secondary | ICD-10-CM | POA: Diagnosis not present

## 2015-04-14 DIAGNOSIS — N6489 Other specified disorders of breast: Secondary | ICD-10-CM | POA: Diagnosis not present

## 2015-04-17 DIAGNOSIS — H10011 Acute follicular conjunctivitis, right eye: Secondary | ICD-10-CM | POA: Diagnosis not present

## 2015-04-17 DIAGNOSIS — H25813 Combined forms of age-related cataract, bilateral: Secondary | ICD-10-CM | POA: Diagnosis not present

## 2015-04-20 MED FILL — JANUVIA 100 MG TABLET: 100 | 90 days supply | Qty: 90 | Fill #0

## 2015-04-28 ENCOUNTER — Encounter: Payer: Self-pay | Admitting: Cardiology

## 2015-04-28 ENCOUNTER — Encounter: Payer: Self-pay | Admitting: *Deleted

## 2015-04-28 ENCOUNTER — Ambulatory Visit (INDEPENDENT_AMBULATORY_CARE_PROVIDER_SITE_OTHER): Payer: 59 | Admitting: Cardiology

## 2015-04-28 VITALS — BP 116/80 | HR 76 | Ht 66.0 in | Wt 248.2 lb

## 2015-04-28 DIAGNOSIS — Z01812 Encounter for preprocedural laboratory examination: Secondary | ICD-10-CM

## 2015-04-28 DIAGNOSIS — I10 Essential (primary) hypertension: Secondary | ICD-10-CM

## 2015-04-28 DIAGNOSIS — E669 Obesity, unspecified: Secondary | ICD-10-CM

## 2015-04-28 DIAGNOSIS — I208 Other forms of angina pectoris: Secondary | ICD-10-CM

## 2015-04-28 DIAGNOSIS — R0789 Other chest pain: Secondary | ICD-10-CM | POA: Diagnosis not present

## 2015-04-28 DIAGNOSIS — E785 Hyperlipidemia, unspecified: Secondary | ICD-10-CM

## 2015-04-28 LAB — CBC
HCT: 39.3 % (ref 36.0–46.0)
HEMOGLOBIN: 13.5 g/dL (ref 12.0–15.0)
MCH: 30.1 pg (ref 26.0–34.0)
MCHC: 34.4 g/dL (ref 30.0–36.0)
MCV: 87.7 fL (ref 78.0–100.0)
MPV: 8.8 fL (ref 8.6–12.4)
Platelets: 214 10*3/uL (ref 150–400)
RBC: 4.48 MIL/uL (ref 3.87–5.11)
RDW: 14.2 % (ref 11.5–15.5)
WBC: 7.6 10*3/uL (ref 4.0–10.5)

## 2015-04-28 LAB — PROTIME-INR
INR: 1.08 (ref <1.50)
PROTHROMBIN TIME: 14.1 s (ref 11.6–15.2)

## 2015-04-28 LAB — BASIC METABOLIC PANEL
BUN: 16 mg/dL (ref 7–25)
CALCIUM: 9.2 mg/dL (ref 8.6–10.4)
CO2: 24 mmol/L (ref 20–31)
Chloride: 105 mmol/L (ref 98–110)
Creat: 0.87 mg/dL (ref 0.50–0.99)
Glucose, Bld: 158 mg/dL — ABNORMAL HIGH (ref 65–99)
Potassium: 3.9 mmol/L (ref 3.5–5.3)
SODIUM: 137 mmol/L (ref 135–146)

## 2015-04-28 NOTE — Patient Instructions (Signed)
Medication Instructions:  The current medical regimen is effective;  continue present plan and medications.  Labwork: Please have blood work today (BMP, CBC and INR).  Testing/Procedures: Your physician has requested that you have a cardiac catheterization. Cardiac catheterization is used to diagnose and/or treat various heart conditions. Doctors may recommend this procedure for a number of different reasons. The most common reason is to evaluate chest pain. Chest pain can be a symptom of coronary artery disease (CAD), and cardiac catheterization can show whether plaque is narrowing or blocking your heart's arteries. This procedure is also used to evaluate the valves, as well as measure the blood flow and oxygen levels in different parts of your heart. For further information please visit [email or web address]. Please follow instruction sheet, as given.  Follow-Up: Follow up after cardiac cath.  If you need a refill on your cardiac medications before your next appointment, please call your pharmacy.  Thank you for choosing [facility]!!

## 2015-04-28 NOTE — Progress Notes (Signed)
Cardiology Office Note   Date:  04/28/2015   ID:  Jessica Bowers, DOB [birth year], MRN [id number]  PCP:  Tawanna Solo, MD  Cardiologist:   Candee Furbish, MD      History of Present Illness: Jessica Bowers is a 65 y.o. 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 Negative troponin.   At prior visit with Rosaria Ferries, PA she was expressing anger that she was left nothing by mouth until afternoon without any study ordered in that she was given insulin and she is not on this at home. Rhonda explained sliding scale and nothing by mouth and she understood.  2 days after last visit, more chest pain. Felt like GERD, but nothing to eat. Felt pressure like, pain, like wants to take breath away. 2 ASA. Longest 45 min. Non exertional. Can happen in recliner. Back behind.    Past Medical History  Diagnosis Date  . Obesity   . Hyperlipidemia   . Diabetes ([place])   . HTN (hypertension)   . GERD (gastroesophageal reflux disease)   . CKD (chronic kidney disease)   . Patellar tendinitis   . LEG PAIN, LEFT   . Arthritis   . Varicose veins   . Varicose vein     bilat   . Bronchitis     hx of   . Shaking     hands  . Shingles     hx of   . History of chicken pox   . Measles     hx of   . Mumps     hx of     Past Surgical History  Procedure Laterality Date  . Tonsillectomy    . Lasik    . Knee surgery  2005    right  . Colonoscopy    . Abdominal hysterectomy  1997  . Excision haglund's deformity with achilles tendon repair Left 05/06/2013    Procedure: LEFT ACHILLES DEBRIDEMENT AND RESECTION / HAGLUND'S EXCISION;  Surgeon: Wylene Simmer, MD;  Location: [place];  Service: Orthopedics;  Laterality: Left;    . Gastrocnemius recession Left 05/06/2013    Procedure: LEFT GASTROC RECESSION;  Surgeon: Wylene Simmer, MD;  Location: [place];  Service: Orthopedics;  Laterality: Left;  . Carpal tunnel release Left 04/07/2014    Procedure: LEFT CARPAL TUNNEL RELEASE;  Surgeon: Daryll Brod, MD;  Location: [place];  Service: Orthopedics;  Laterality: Left;  . Varicose vein surgery    .  stab plebectomy      03/21/2014   . Total knee arthroplasty Right 11/28/2014    Procedure: TOTAL RIGHT KNEE ARTHROPLASTY;  Surgeon: Gaynelle Arabian, MD;  [facility];  Service: Orthopedics;  Laterality: Right;     Current Outpatient Prescriptions  Medication Sig Dispense Refill  . amoxicillin (AMOXIL) 500 MG capsule Take 500 mg by mouth 2 (two) times daily. FOR DENTAL APPT.  0  . aspirin 81 MG tablet Take 81 mg by mouth daily.    Marland Kitchen atenolol (TENORMIN) 50 MG tablet Take 50 mg by mouth every morning.     Marland Kitchen atorvastatin (LIPITOR) 20 MG tablet Take 1 tablet (20 mg total) by mouth daily. 90 tablet 1  . JANUVIA 100 MG tablet   3  .  losartan (COZAAR) 100 MG tablet Take 100 mg by mouth every morning.     Marland Kitchen omega-3 acid ethyl esters (LOVAZA) 1 G capsule Take by mouth 2 (two) times daily.    Marland Kitchen oxybutynin (DITROPAN-XL) 10 MG 24 hr tablet Take 10 mg by mouth every morning.     . polyvinyl alcohol (LIQUIFILM TEARS) 1.4 % ophthalmic solution Place 1 drop into both eyes 2 (two) times daily.    . sitaGLIPtin (JANUVIA) 25 MG tablet Take 25 mg by mouth every morning.     . vitamin B-12 (CYANOCOBALAMIN) 100 MCG tablet Take 100 mcg by mouth daily.     No current facility-administered medications for this visit.    Allergies:   Ace inhibitors; Crestor; Ezetimibe; Glucophage; Nsaids; Pioglitazone; Vasotec; and Decadron    Social History:  The patient  reports that she has never smoked. She has never used smokeless tobacco. She reports that she drinks alcohol. She reports that she does not use illicit  drugs.   Family History:  The patient's family history includes CAD in her mother; Diabetes in her father; Heart attack in her mother; Hyperlipidemia in her mother; Hypertension in her father; Stroke in her mother.    ROS:  Please see the history of present illness.   Otherwise, review of systems are positive for none.   All other systems are reviewed and negative.    PHYSICAL EXAM: VS:  BP 116/80 mmHg  Pulse 76  Ht 5\' 6"  (1.676 m)  Wt 248 lb 3.2 oz (112.583 kg)  BMI 40.08 kg/m2  SpO2 98% , BMI Body mass index is 40.08 kg/(m^2). GEN: Well nourished, well developed, in no acute distress HEENT: normal Neck: no JVD, carotid bruits, or masses Cardiac: RRR; no murmurs, rubs, or gallops,no edema  Respiratory:  clear to auscultation bilaterally, normal work of breathing GI: soft, nontender, nondistended, + BS MS: no deformity or atrophy Skin: warm and dry, no rash Neuro:  Strength and sensation are intact Psych: euthymic mood, full affect   EKG: No ECG today  NUC: 12/24/12: 1. Small focus of inducible ischemia in the apical anteroseptum extending to the true ventricular apex. 2. Normal ventricular wall motion. 3. Calculated ejection fraction 59%.   Recent Labs: 11/15/2014: ALT 37 12/09/2014: BUN 12; Creatinine, Ser 0.76; Hemoglobin 10.1*; Platelets 288; Potassium 3.8; Sodium 136    Lipid Panel    Component Value Date/Time   CHOL 142 08/25/2013 0950   TRIG 54.0 08/25/2013 0950   HDL 69.50 08/25/2013 0950   CHOLHDL 2 08/25/2013 0950   VLDL 10.8 08/25/2013 0950   LDLCALC 62 08/25/2013 0950      Wt Readings from Last 3 Encounters:  04/28/15 248 lb 3.2 oz (112.583 kg)  03/13/15 248 lb 1.9 oz (112.546 kg)  12/21/14 240 lb (108.863 kg)      Other studies Reviewed: Additional studies/ records that were reviewed today include: labs, office visits, hospital records. Review of the above records demonstrates: as above   ASSESSMENT AND PLAN:  Anginal symptoms  - Ongoing  symptoms, worrisome to her. With diabetes is a coronary artery disease equivalent, we will proceed with diagnostic heart catheterization via radial artery approach. Risks and benefits of procedure including stroke, heart attack, death, renal impairment, bleeding discussed. Willing to proceed. Questions answered. - Continue with aggressive secondary/primary prevention  Diabetes mellitus without insulin  - Januvia, weight loss  Obesity  - Weight Loss  Hyperlipidemia  - Continue statin therapy, atorvastatin to 20 mg. Her last LDL was  76 in July 2016. Remember, diabetes is a coronary disease equivalent. She was concerned about possible leg cramps with this medication increase. Continue to monitor. She has tolerated atorvastatin 10 mg previously very well.  Knee replacement - Physical therapy exercises.  On previous visit, went over her hospital experience, lack of communication, waiting nothing by mouth for a test etc. Apologized for the inconvenience.   Current medicines are reviewed at length with the patient today.  The patient does not have concerns regarding medicines.  The following changes have been made:  none  Labs/ tests ordered today include:    Disposition:   FU with Jessic Standifer 6 months  Signed, Candee Furbish, MD  04/28/2015 9:44 AM    [facility] [place], [place], [place]  [address] Phone: [phone]; Fax: [phone]

## 2015-05-01 ENCOUNTER — Telehealth: Payer: Self-pay | Admitting: *Deleted

## 2015-05-01 NOTE — Telephone Encounter (Signed)
Lmptcb for lab results 

## 2015-05-01 NOTE — Telephone Encounter (Signed)
Ptcb and has been notified of lab results ok for cath.

## 2015-05-03 ENCOUNTER — Encounter (HOSPITAL_COMMUNITY)
Admission: RE | Disposition: A | Discharge status: Home / Self Care | Payer: Self-pay | Source: Ambulatory Visit | Attending: Cardiology

## 2015-05-03 ENCOUNTER — Ambulatory Visit (HOSPITAL_COMMUNITY)
Admission: RE | Admit: 2015-05-03 | Discharge: 2015-05-03 | Disposition: A | Discharge status: Home / Self Care | Payer: 59 | Source: Ambulatory Visit | Attending: Cardiology | Admitting: Cardiology

## 2015-05-03 ENCOUNTER — Encounter (HOSPITAL_COMMUNITY): Payer: Self-pay | Admitting: Cardiology

## 2015-05-03 DIAGNOSIS — R079 Chest pain, unspecified: Secondary | ICD-10-CM | POA: Diagnosis not present

## 2015-05-03 DIAGNOSIS — Z7982 Long term (current) use of aspirin: Secondary | ICD-10-CM | POA: Diagnosis not present

## 2015-05-03 DIAGNOSIS — I129 Hypertensive chronic kidney disease with stage 1 through stage 4 chronic kidney disease, or unspecified chronic kidney disease: Secondary | ICD-10-CM | POA: Insufficient documentation

## 2015-05-03 DIAGNOSIS — E1122 Type 2 diabetes mellitus with diabetic chronic kidney disease: Secondary | ICD-10-CM | POA: Diagnosis not present

## 2015-05-03 DIAGNOSIS — E785 Hyperlipidemia, unspecified: Secondary | ICD-10-CM | POA: Insufficient documentation

## 2015-05-03 DIAGNOSIS — Z8249 Family history of ischemic heart disease and other diseases of the circulatory system: Secondary | ICD-10-CM | POA: Diagnosis not present

## 2015-05-03 DIAGNOSIS — I208 Other forms of angina pectoris: Secondary | ICD-10-CM

## 2015-05-03 DIAGNOSIS — K219 Gastro-esophageal reflux disease without esophagitis: Secondary | ICD-10-CM | POA: Insufficient documentation

## 2015-05-03 DIAGNOSIS — Z6841 Body Mass Index (BMI) 40.0 and over, adult: Secondary | ICD-10-CM | POA: Insufficient documentation

## 2015-05-03 DIAGNOSIS — Z96651 Presence of right artificial knee joint: Secondary | ICD-10-CM | POA: Insufficient documentation

## 2015-05-03 DIAGNOSIS — M1990 Unspecified osteoarthritis, unspecified site: Secondary | ICD-10-CM | POA: Diagnosis not present

## 2015-05-03 DIAGNOSIS — N189 Chronic kidney disease, unspecified: Secondary | ICD-10-CM | POA: Insufficient documentation

## 2015-05-03 DIAGNOSIS — E669 Obesity, unspecified: Secondary | ICD-10-CM | POA: Insufficient documentation

## 2015-05-03 DIAGNOSIS — I8390 Asymptomatic varicose veins of unspecified lower extremity: Secondary | ICD-10-CM | POA: Diagnosis not present

## 2015-05-03 HISTORY — PX: CARDIAC CATHETERIZATION: SHX172

## 2015-05-03 LAB — GLUCOSE, CAPILLARY: GLUCOSE-CAPILLARY: 158 mg/dL — AB (ref 65–99)

## 2015-05-03 SURGERY — LEFT HEART CATH AND CORONARY ANGIOGRAPHY

## 2015-05-03 MED ORDER — SODIUM CHLORIDE 0.9 % IJ SOLN
3.0000 mL | Freq: Two times a day (BID) | INTRAMUSCULAR | Status: DC
Start: 2015-05-03 — End: 2015-05-03

## 2015-05-03 MED ORDER — HEPARIN SODIUM (PORCINE) 1000 UNIT/ML IJ SOLN
INTRAMUSCULAR | Status: AC
  Filled 2015-05-03: qty 1

## 2015-05-03 MED ORDER — MIDAZOLAM HCL 2 MG/2ML IJ SOLN
INTRAMUSCULAR | Status: AC
  Filled 2015-05-03: qty 2

## 2015-05-03 MED ORDER — SODIUM CHLORIDE 0.9 % IV SOLN: 250.0000 mL | INTRAVENOUS | Status: DC | PRN

## 2015-05-03 MED ORDER — SODIUM CHLORIDE 0.9 % WEIGHT BASED INFUSION: 1.0000 mL/kg/h | INTRAVENOUS | Status: DC

## 2015-05-03 MED ORDER — SODIUM CHLORIDE 0.9 % WEIGHT BASED INFUSION
3.0000 mL/kg/h | INTRAVENOUS | Status: DC
  Administered 2015-05-03: 3 mL/kg/h via INTRAVENOUS

## 2015-05-03 MED ORDER — VERAPAMIL HCL 2.5 MG/ML IV SOLN
INTRAVENOUS | Status: DC | PRN
  Administered 2015-05-03: 3 mL via INTRA_ARTERIAL

## 2015-05-03 MED ORDER — SODIUM CHLORIDE 0.9% FLUSH: 3.0000 mL | Freq: Two times a day (BID) | INTRAVENOUS | Status: DC

## 2015-05-03 MED ORDER — SODIUM CHLORIDE 0.9 % IJ SOLN: 3.0000 mL | INTRAMUSCULAR | Status: DC | PRN

## 2015-05-03 MED ORDER — FENTANYL CITRATE (PF) 100 MCG/2ML IJ SOLN
INTRAMUSCULAR | Status: AC
  Filled 2015-05-03: qty 2

## 2015-05-03 MED ORDER — FENTANYL CITRATE (PF) 100 MCG/2ML IJ SOLN
INTRAMUSCULAR | Status: DC | PRN
  Administered 2015-05-03 (×2): 25 ug via INTRAVENOUS

## 2015-05-03 MED ORDER — HEPARIN (PORCINE) IN NACL 2-0.9 UNIT/ML-% IJ SOLN
INTRAMUSCULAR | Status: AC
  Filled 2015-05-03: qty 1000

## 2015-05-03 MED ORDER — HEPARIN (PORCINE) IN NACL 2-0.9 UNIT/ML-% IJ SOLN
INTRAMUSCULAR | Status: DC | PRN
  Administered 2015-05-03: 12:00:00

## 2015-05-03 MED ORDER — SODIUM CHLORIDE 0.9 % WEIGHT BASED INFUSION: 3.0000 mL/kg/h | INTRAVENOUS | Status: DC

## 2015-05-03 MED ORDER — MIDAZOLAM HCL 2 MG/2ML IJ SOLN
INTRAMUSCULAR | Status: DC | PRN
Start: 2015-05-03 — End: 2015-05-03
  Administered 2015-05-03: 1 mg via INTRAVENOUS
  Administered 2015-05-03: 2 mg via INTRAVENOUS

## 2015-05-03 MED ORDER — HEPARIN SODIUM (PORCINE) 1000 UNIT/ML IJ SOLN
INTRAMUSCULAR | Status: DC | PRN
  Administered 2015-05-03: 5000 [IU] via INTRAVENOUS

## 2015-05-03 MED ORDER — SODIUM CHLORIDE 0.9% FLUSH: 3.0000 mL | INTRAVENOUS | Status: DC | PRN

## 2015-05-03 MED ORDER — VERAPAMIL HCL 2.5 MG/ML IV SOLN
INTRAVENOUS | Status: AC
  Filled 2015-05-03: qty 2

## 2015-05-03 MED ORDER — ASPIRIN 81 MG PO CHEW: 81.0000 mg | CHEWABLE_TABLET | ORAL | Status: DC

## 2015-05-03 MED ORDER — LIDOCAINE HCL (PF) 1 % IJ SOLN
INTRAMUSCULAR | Status: AC
  Filled 2015-05-03: qty 30

## 2015-05-03 SURGICAL SUPPLY — 11 items
CATH INFINITI 5 FR JL3.5 (CATHETERS) ×2 IMPLANT
CATH INFINITI JR4 5F (CATHETERS) ×2 IMPLANT
DEVICE RAD COMP TR BAND LRG (VASCULAR PRODUCTS) ×2 IMPLANT
GLIDESHEATH SLEND SS 6F .021 (SHEATH) ×2 IMPLANT
KIT HEART LEFT (KITS) ×2 IMPLANT
NDL PERC ENTRY 21G 2.5CM (NEEDLE) IMPLANT
NEEDLE PERC ENTRY 21G 2.5CM (NEEDLE) ×2 IMPLANT
PACK CARDIAC CATHETERIZATION (CUSTOM PROCEDURE TRAY) ×2 IMPLANT
TRANSDUCER W/STOPCOCK (MISCELLANEOUS) ×2 IMPLANT
TUBING CIL FLEX 10 FLL-RA (TUBING) ×2 IMPLANT
WIRE SAFE-T 1.5MM-J .035X260CM (WIRE) ×2 IMPLANT

## 2015-05-03 NOTE — H&P (View-Only) (Signed)
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 Negative troponin.   At prior visit with Rosaria Ferries, PA she was expressing anger that she was left nothing by mouth until afternoon without any study ordered in that she was given insulin and she is not on this at home. Rhonda explained sliding scale and nothing by mouth and she understood.  2 days after last visit, more chest pain. Felt like GERD, but nothing to eat. Felt pressure like, pain, like wants to take breath away. 2 ASA. Longest 45 min. Non exertional. Can happen in recliner. Back behind.    Past Medical History  Diagnosis Date  . Obesity   . Hyperlipidemia   . Diabetes ([place])   . HTN (hypertension)   . GERD (gastroesophageal reflux disease)   . CKD (chronic kidney disease)   . Patellar tendinitis   . LEG PAIN, LEFT   . Arthritis   . Varicose veins   . Varicose vein     bilat   . Bronchitis     hx of   . Shaking     hands  . Shingles     hx of   . History of chicken pox   . Measles     hx of   . Mumps     hx of     Past Surgical History  Procedure Laterality Date  . Tonsillectomy    . Lasik    . Knee surgery  2005    right  . Colonoscopy    . Abdominal hysterectomy  1997  . Excision haglund's deformity with achilles tendon repair Left 05/06/2013    Procedure: LEFT ACHILLES DEBRIDEMENT AND RESECTION / HAGLUND'S EXCISION;  Surgeon: Wylene Simmer, MD;  Location: [place];  Service: Orthopedics;  Laterality: Left;    . Gastrocnemius recession Left 05/06/2013    Procedure: LEFT GASTROC RECESSION;  Surgeon: Wylene Simmer, MD;  Location: [place];  Service: Orthopedics;  Laterality: Left;  . Carpal tunnel release Left 04/07/2014    Procedure: LEFT CARPAL TUNNEL RELEASE;  Surgeon: Daryll Brod, MD;  Location: [place];  Service: Orthopedics;  Laterality: Left;  . Varicose vein surgery    .  stab plebectomy      03/21/2014   . Total knee arthroplasty Right 11/28/2014    Procedure: TOTAL RIGHT KNEE ARTHROPLASTY;  Surgeon: Gaynelle Arabian, MD;  [facility];  Service: Orthopedics;  Laterality: Right;     Current Outpatient Prescriptions  Medication Sig Dispense Refill  . amoxicillin (AMOXIL) 500 MG capsule Take 500 mg by mouth 2 (two) times daily. FOR DENTAL APPT.  0  . aspirin 81 MG tablet Take 81 mg by mouth daily.    Marland Kitchen atenolol (TENORMIN) 50 MG tablet Take 50 mg by mouth every morning.     Marland Kitchen atorvastatin (LIPITOR) 20 MG tablet Take 1 tablet (20 mg total) by mouth daily. 90 tablet 1  . JANUVIA 100 MG tablet   3  .  losartan (COZAAR) 100 MG tablet Take 100 mg by mouth every morning.     Marland Kitchen omega-3 acid ethyl esters (LOVAZA) 1 G capsule Take by mouth 2 (two) times daily.    Marland Kitchen oxybutynin (DITROPAN-XL) 10 MG 24 hr tablet Take 10 mg by mouth every morning.     . polyvinyl alcohol (LIQUIFILM TEARS) 1.4 % ophthalmic solution Place 1 drop into both eyes 2 (two) times daily.    . sitaGLIPtin (JANUVIA) 25 MG tablet Take 25 mg by mouth every morning.     . vitamin B-12 (CYANOCOBALAMIN) 100 MCG tablet Take 100 mcg by mouth daily.     No current facility-administered medications for this visit.    Allergies:   Ace inhibitors; Crestor; Ezetimibe; Glucophage; Nsaids; Pioglitazone; Vasotec; and Decadron    Social History:  The patient  reports that she has never smoked. She has never used smokeless tobacco. She reports that she drinks alcohol. She reports that she does not use illicit  drugs.   Family History:  The patient's family history includes CAD in her mother; Diabetes in her father; Heart attack in her mother; Hyperlipidemia in her mother; Hypertension in her father; Stroke in her mother.    ROS:  Please see the history of present illness.   Otherwise, review of systems are positive for none.   All other systems are reviewed and negative.    PHYSICAL EXAM: VS:  BP 116/80 mmHg  Pulse 76  Ht 5\' 6"  (1.676 m)  Wt 248 lb 3.2 oz (112.583 kg)  BMI 40.08 kg/m2  SpO2 98% , BMI Body mass index is 40.08 kg/(m^2). GEN: Well nourished, well developed, in no acute distress HEENT: normal Neck: no JVD, carotid bruits, or masses Cardiac: RRR; no murmurs, rubs, or gallops,no edema  Respiratory:  clear to auscultation bilaterally, normal work of breathing GI: soft, nontender, nondistended, + BS MS: no deformity or atrophy Skin: warm and dry, no rash Neuro:  Strength and sensation are intact Psych: euthymic mood, full affect   EKG: No ECG today  NUC: 12/24/12: 1. Small focus of inducible ischemia in the apical anteroseptum extending to the true ventricular apex. 2. Normal ventricular wall motion. 3. Calculated ejection fraction 59%.   Recent Labs: 11/15/2014: ALT 37 12/09/2014: BUN 12; Creatinine, Ser 0.76; Hemoglobin 10.1*; Platelets 288; Potassium 3.8; Sodium 136    Lipid Panel    Component Value Date/Time   CHOL 142 08/25/2013 0950   TRIG 54.0 08/25/2013 0950   HDL 69.50 08/25/2013 0950   CHOLHDL 2 08/25/2013 0950   VLDL 10.8 08/25/2013 0950   LDLCALC 62 08/25/2013 0950      Wt Readings from Last 3 Encounters:  04/28/15 248 lb 3.2 oz (112.583 kg)  03/13/15 248 lb 1.9 oz (112.546 kg)  12/21/14 240 lb (108.863 kg)      Other studies Reviewed: Additional studies/ records that were reviewed today include: labs, office visits, hospital records. Review of the above records demonstrates: as above   ASSESSMENT AND PLAN:  Anginal symptoms  - Ongoing  symptoms, worrisome to her. With diabetes is a coronary artery disease equivalent, we will proceed with diagnostic heart catheterization via radial artery approach. Risks and benefits of procedure including stroke, heart attack, death, renal impairment, bleeding discussed. Willing to proceed. Questions answered. - Continue with aggressive secondary/primary prevention  Diabetes mellitus without insulin  - Januvia, weight loss  Obesity  - Weight Loss  Hyperlipidemia  - Continue statin therapy, atorvastatin to 20 mg. Her last LDL was  76 in July 2016. Remember, diabetes is a coronary disease equivalent. She was concerned about possible leg cramps with this medication increase. Continue to monitor. She has tolerated atorvastatin 10 mg previously very well.  Knee replacement - Physical therapy exercises.  On previous visit, went over her hospital experience, lack of communication, waiting nothing by mouth for a test etc. Apologized for the inconvenience.   Current medicines are reviewed at length with the patient today.  The patient does not have concerns regarding medicines.  The following changes have been made:  none  Labs/ tests ordered today include:    Disposition:   FU with Quaniya Damas 6 months  Signed, Candee Furbish, MD  04/28/2015 9:44 AM    [facility] [place], [place], [place]  [address] Phone: [phone]; Fax: [phone]

## 2015-05-03 NOTE — Interval H&P Note (Signed)
History and Physical Interval Note:  05/03/2015 12:00 PM  Jessica Bowers  has presented today for surgery, with the diagnosis of cp  The various methods of treatment have been discussed with the patient and family. After consideration of risks, benefits and other options for treatment, the patient has consented to  Procedure(s): Left Heart Cath and Coronary Angiography (N/A) as a surgical intervention .  The patient's history has been reviewed, patient examined, no change in status, stable for surgery.  I have reviewed the patient's chart and labs.  Questions were answered to the patient's satisfaction.     Chanette Demo

## 2015-05-03 NOTE — Research (Signed)
CADLAD Informed Consent   Subject Name: Jessica Bowers  Subject met inclusion and exclusion criteria.  The informed consent form, study requirements and expectations were reviewed with the subject and questions and concerns were addressed prior to the signing of the consent form.  The subject verbalized understanding of the trail requirements.  The subject agreed to participate in the CADLAD trial and signed the informed consent.  The informed consent was obtained prior to performance of any protocol-specific procedures for the subject.  A copy of the signed informed consent was given to the subject and a copy was placed in the subject's medical record.  Hedrick,Tammy W 05/03/2015, 11:00

## 2015-05-03 NOTE — Discharge Instructions (Signed)
Radial Site Care °Refer to this sheet in the next few weeks. These instructions provide you with information about caring for yourself after your procedure. Your health care provider may also give you more specific instructions. Your treatment has been planned according to current medical practices, but problems sometimes occur. Call your health care provider if you have any problems or questions after your procedure. °WHAT TO EXPECT AFTER THE PROCEDURE °After your procedure, it is typical to have the following: °· Bruising at the radial site that usually fades within 1-2 weeks. °· Blood collecting in the tissue (hematoma) that may be painful to the touch. It should usually decrease in size and tenderness within 1-2 weeks. °HOME CARE INSTRUCTIONS °· Take medicines only as directed by your health care provider. °· You may shower 24-48 hours after the procedure or as directed by your health care provider. Remove the bandage (dressing) and gently wash the site with plain soap and water. Pat the area dry with a clean towel. Do not rub the site, because this may cause bleeding. °· Do not take baths, swim, or use a hot tub until your health care provider approves. °· Check your insertion site every day for redness, swelling, or drainage. °· Do not apply powder or lotion to the site. °· Do not flex or bend the affected arm for 24 hours or as directed by your health care provider. °· Do not push or pull heavy objects with the affected arm for 24 hours or as directed by your health care provider. °· Do not lift over 10 lb (4.5 kg) for 5 days after your procedure or as directed by your health care provider. °· Ask your health care provider when it is okay to: °¨ Return to work or school. °¨ Resume usual physical activities or sports. °¨ Resume sexual activity. °· Do not drive home if you are discharged the same day as the procedure. Have someone else drive you. °· You may drive 24 hours after the procedure unless otherwise  instructed by your health care provider. °· Do not operate machinery or power tools for 24 hours after the procedure. °· If your procedure was done as an outpatient procedure, which means that you went home the same day as your procedure, a responsible adult should be with you for the first 24 hours after you arrive home. °· Keep all follow-up visits as directed by your health care provider. This is important. °SEEK MEDICAL CARE IF: °· You have a fever. °· You have chills. °· You have increased bleeding from the radial site. Hold pressure on the site. °SEEK IMMEDIATE MEDICAL CARE IF: °· You have unusual pain at the radial site. °· You have redness, warmth, or swelling at the radial site. °· You have drainage (other than a small amount of blood on the dressing) from the radial site. °· The radial site is bleeding, and the bleeding does not stop after 30 minutes of holding steady pressure on the site. °· Your arm or hand becomes pale, cool, tingly, or numb. °  °This information is not intended to replace advice given to you by your health care provider. Make sure you discuss any questions you have with your health care provider. °  °Document Released: 04/27/2010 Document Revised: 04/15/2014 Document Reviewed: 10/11/2013 °Elsevier Interactive Patient Education ©2016 Elsevier Inc. ° °

## 2015-05-05 MED FILL — LOSARTAN POTASSIUM 100 MG T: 100 | 90 days supply | Qty: 90 | Fill #2

## 2015-05-11 MED FILL — YUVAFEM 10 MCG VAGINAL INSE: 10 | 84 days supply | Qty: 24 | Fill #0

## 2015-05-23 ENCOUNTER — Encounter: Payer: Self-pay | Admitting: Cardiology

## 2015-05-23 ENCOUNTER — Ambulatory Visit (INDEPENDENT_AMBULATORY_CARE_PROVIDER_SITE_OTHER): Payer: 59 | Admitting: Cardiology

## 2015-05-23 VITALS — BP 124/82 | HR 68 | Ht 66.0 in | Wt 250.1 lb

## 2015-05-23 DIAGNOSIS — I10 Essential (primary) hypertension: Secondary | ICD-10-CM

## 2015-05-23 DIAGNOSIS — E785 Hyperlipidemia, unspecified: Secondary | ICD-10-CM | POA: Diagnosis not present

## 2015-05-23 DIAGNOSIS — R0789 Other chest pain: Secondary | ICD-10-CM

## 2015-05-23 NOTE — Patient Instructions (Signed)
Medication Instructions:  Your physician recommends that you continue on your current medications as directed. Please refer to the Current Medication list given to you today.  Labwork: None ordered.  Testing/Procedures: None ordered.  Follow-Up: Your physician recommends that you schedule a follow-up appointment as needed.   Any Other Special Instructions Will Be Listed Below (If Applicable).     If you need a refill on your cardiac medications before your next appointment, please call your pharmacy.   

## 2015-05-23 NOTE — Progress Notes (Signed)
Cardiology Office Note   Date:  05/23/2015   ID:  Jessica Bowers, DOB Apr 26, 1950, MRN [id number]  PCP:  Tawanna Solo, MD  Cardiologist:   Candee Furbish, MD      History of Present Illness: Jessica Bowers is a 65 y.o. 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 Negative troponin. Her chest discomfort finally resulted in cardiac catheterization on 05/03/15 which was reassuring showing no evidence of angiographically significant coronary artery disease.  At prior visit with Rosaria Ferries, PA she was expressing anger that she was left nothing by mouth until afternoon without any study ordered in that she was given insulin and she is not on this at home. Jessica Bowers explained sliding scale and nothing by mouth and she understood.  2 days after last visit, more chest pain. Felt like GERD, but nothing to eat. Felt pressure like, pain, like wants to take breath away. 2 ASA. Longest 45 min. Non exertional. Can happen in recliner.    She underwent heart catheterization which was reassuring. Overall she is having no further discomfort. She only had one mild episode. If symptoms continue, GI workup. She is helping to plan wedding for her daughter. Increased stress.   Past Medical History  Diagnosis Date  . Obesity   . Hyperlipidemia   . Diabetes ([place])   . HTN (hypertension)   . GERD (gastroesophageal reflux disease)   . CKD (chronic kidney disease)   . Patellar tendinitis   . LEG PAIN, LEFT   . Arthritis   . Varicose veins   . Varicose vein     bilat   . Bronchitis     hx of   . Shaking     hands  . Shingles     hx of   . History of chicken pox   . Measles     hx of   . Mumps     hx of     Past Surgical History    Procedure Laterality Date  . Tonsillectomy    . Lasik    . Knee surgery  2005    right  . Colonoscopy    . Abdominal hysterectomy  1997  . Excision haglund's deformity with achilles tendon repair Left 05/06/2013    Procedure: LEFT ACHILLES DEBRIDEMENT AND RESECTION / HAGLUND'S EXCISION;  Surgeon: Wylene Simmer, MD;  Location: [facility];  Service: Orthopedics;  Laterality: Left;  . Gastrocnemius recession Left 05/06/2013    Procedure: LEFT GASTROC RECESSION;  Surgeon: Wylene Simmer, MD;  Location: [facility];  Service: Orthopedics;  Laterality: Left;  . Carpal tunnel release Left 04/07/2014    Procedure: LEFT CARPAL TUNNEL RELEASE;  Surgeon: Daryll Brod, MD;  Location: [facility];  Service: Orthopedics;  Laterality: Left;  . Varicose vein surgery    .  stab plebectomy      03/21/2014   . Total knee arthroplasty Right 11/28/2014    Procedure: TOTAL RIGHT KNEE ARTHROPLASTY;  Surgeon: Gaynelle Arabian, MD;  Location: [facility];  Service: Orthopedics;  Laterality: Right;  . Cardiac catheterization N/A 05/03/2015    Procedure: Left Heart Cath and Coronary Angiography;  Surgeon: Jerline Pain, MD;  Location: [facility];  Service: Cardiovascular;  Laterality: N/A;  Current Outpatient Prescriptions  Medication Sig Dispense Refill  . amoxicillin (AMOXIL) 500 MG capsule Take 500 mg by mouth 2 (two) times daily. FOR DENTAL APPT.  0  . aspirin 81 MG tablet Take 81 mg by mouth daily.    Marland Kitchen atenolol (TENORMIN) 50 MG tablet Take 50 mg by mouth every morning.     Marland Kitchen atorvastatin (LIPITOR) 20 MG tablet Take 1 tablet (20 mg total) by mouth daily. 90 tablet 1  . cholecalciferol (VITAMIN D) 1000 units tablet Take 1,000 Units by mouth daily.    Marland Kitchen CINNAMON PO Take 1 Dose by mouth daily.    Marland Kitchen JANUVIA 100 MG tablet Take 100 mg by mouth daily.   3  . losartan (COZAAR) 100 MG tablet Take 100 mg by mouth every morning.     Marland Kitchen omega-3 acid ethyl esters (LOVAZA) 1 G  capsule Take by mouth 2 (two) times daily.    Marland Kitchen oxybutynin (DITROPAN-XL) 10 MG 24 hr tablet Take 10 mg by mouth every morning.     . polyvinyl alcohol (LIQUIFILM TEARS) 1.4 % ophthalmic solution Place 1 drop into both eyes 2 (two) times daily.    . TURMERIC PO Take 1 Dose by mouth daily.    . vitamin B-12 (CYANOCOBALAMIN) 100 MCG tablet Take 100 mcg by mouth daily.    Merril Abbe 10 MCG TABS vaginal tablet Place 10 mcg vaginally once a week.   11   No current facility-administered medications for this visit.    Allergies:   Ace inhibitors; Crestor; Ezetimibe; Glucophage; Nsaids; Pioglitazone; Vasotec; and Decadron    Social History:  The patient  reports that she has never smoked. She has never used smokeless tobacco. She reports that she drinks alcohol. She reports that she does not use illicit drugs.   Family History:  The patient's family history includes CAD in her mother; Diabetes in her father; Heart attack in her mother; Hyperlipidemia in her mother; Hypertension in her father; Stroke in her mother.    ROS:  Please see the history of present illness.   Otherwise, review of systems are positive for none.   All other systems are reviewed and negative.    PHYSICAL EXAM: VS:  BP 124/82 mmHg  Pulse 68  Ht 5\' 6"  (1.676 m)  Wt 250 lb 1.9 oz (113.454 kg)  BMI 40.39 kg/m2  SpO2 96% , BMI Body mass index is 40.39 kg/(m^2). GEN: Well nourished, well developed, in no acute distress HEENT: normal Neck: no JVD, carotid bruits, or masses Cardiac: RRR; no murmurs, rubs, or gallops,no edema  Respiratory:  clear to auscultation bilaterally, normal work of breathing GI: soft, nontender, nondistended, + BS MS: no deformity or atrophy Skin: warm and dry, no rash, radial artery site normal. Neuro:  Strength and sensation are intact Psych: euthymic mood, full affect   EKG: No ECG today  NUC: 12/24/12: 1. Small focus of inducible ischemia in the apical anteroseptum extending to the true  ventricular apex. 2. Normal ventricular wall motion. 3. Calculated ejection fraction 59%.   Recent Labs: 11/15/2014: ALT 37 04/28/2015: BUN 16; Creat 0.87; Hemoglobin 13.5; Platelets 214; Potassium 3.9; Sodium 137    Lipid Panel    Component Value Date/Time   CHOL 142 08/25/2013 0950   TRIG 54.0 08/25/2013 0950   HDL 69.50 08/25/2013 0950   CHOLHDL 2 08/25/2013 0950   VLDL 10.8 08/25/2013 0950   LDLCALC 62 08/25/2013 0950      Wt Readings from Last 3 Encounters:  05/23/15 250 lb 1.9 oz (113.454 kg)  05/03/15 248 lb (112.492 kg)  04/28/15 248 lb 3.2 oz (112.583 kg)      Other studies Reviewed: Additional studies/ records that were reviewed today include: labs, office visits, hospital records. Review of the above records demonstrates: as above   ASSESSMENT AND PLAN:  Anginal symptoms  - Reassuring heart catheterization on 05/03/15  -The left ventricular systolic function is normal.  No angiographically significant coronary artery disease. Small distal vessels in both right and left system.  Normal left ventricular pressures, no aortic stenosis  Reassuring heart catheterization. Continue with medical management.  Candee Furbish, MD  - Consider further workup, possible GI source for discomfort, sharp debilitating epigastric discomfort at times. Reassuring cardiac status.    Diabetes mellitus without insulin  - Januvia, weight loss  Obesity  - Weight Loss  Hyperlipidemia  - Continue statin therapy, atorvastatin to 20 mg. Her last LDL was 76 in July 2016. Remember, diabetes is a coronary disease equivalent. She was concerned about possible leg cramps with this medication increase. Continue to monitor. She has tolerated atorvastatin 10 mg previously very well.  Knee replacement - Physical therapy exercises.  On previous visit, went over her hospital experience, lack of communication, waiting nothing by mouth for a test etc. Apologized for the inconvenience. She  was overall impressed with the cath lab staff.   Current medicines are reviewed at length with the patient today.  The patient does not have concerns regarding medicines.  The following changes have been made:  none  Labs/ tests ordered today include:    Disposition:   PRN FU   Bobby Rumpf, MD  05/23/2015 8:41 AM    [facility] [place], [place], [place]  [address] Phone: [phone]; Fax: [phone]

## 2015-05-26 DIAGNOSIS — Z471 Aftercare following joint replacement surgery: Secondary | ICD-10-CM | POA: Diagnosis not present

## 2015-05-26 DIAGNOSIS — Z96651 Presence of right artificial knee joint: Secondary | ICD-10-CM | POA: Diagnosis not present

## 2015-06-06 MED FILL — OXYBUTYNIN CL ER 10 MG TAB: 10 | 90 days supply | Qty: 90 | Fill #3

## 2015-06-06 MED FILL — ATENOLOL 50 MG TABLET: 50 | 90 days supply | Qty: 90 | Fill #2

## 2015-06-23 DIAGNOSIS — H10412 Chronic giant papillary conjunctivitis, left eye: Secondary | ICD-10-CM | POA: Diagnosis not present

## 2015-06-23 MED FILL — TOBRAMYCIN-DEXAMETH OPTH SU: 0.3-0.1 | 5 days supply | Qty: 5 | Fill #0

## 2015-07-06 MED FILL — ATORVASTATIN 20 MG TABLET: 20 | 90 days supply | Qty: 90 | Fill #1

## 2015-07-18 DIAGNOSIS — Z6841 Body Mass Index (BMI) 40.0 and over, adult: Secondary | ICD-10-CM | POA: Diagnosis not present

## 2015-07-18 DIAGNOSIS — E119 Type 2 diabetes mellitus without complications: Secondary | ICD-10-CM | POA: Diagnosis not present

## 2015-07-18 DIAGNOSIS — Z7984 Long term (current) use of oral hypoglycemic drugs: Secondary | ICD-10-CM | POA: Diagnosis not present

## 2015-07-24 MED FILL — JARDIANCE 10 MG TABLET: 10 | 90 days supply | Qty: 90 | Fill #0 | Status: TO

## 2015-07-25 MED FILL — JANUVIA 100 MG TABLET: 100 | 90 days supply | Qty: 90 | Fill #1

## 2015-08-04 MED FILL — LOSARTAN POTASSIUM 100 MG T: 100 | 90 days supply | Qty: 90 | Fill #3

## 2015-08-22 DIAGNOSIS — H25813 Combined forms of age-related cataract, bilateral: Secondary | ICD-10-CM | POA: Diagnosis not present

## 2015-08-22 DIAGNOSIS — E119 Type 2 diabetes mellitus without complications: Secondary | ICD-10-CM | POA: Diagnosis not present

## 2015-09-05 DIAGNOSIS — Z01419 Encounter for gynecological examination (general) (routine) without abnormal findings: Secondary | ICD-10-CM | POA: Diagnosis not present

## 2015-09-05 DIAGNOSIS — Z6841 Body Mass Index (BMI) 40.0 and over, adult: Secondary | ICD-10-CM | POA: Diagnosis not present

## 2015-09-13 MED FILL — OXYBUTYNIN CL ER 10 MG TAB: 10 | 90 days supply | Qty: 90 | Fill #0

## 2015-09-13 MED FILL — ATENOLOL 50 MG TABLET: 50 | 90 days supply | Qty: 90 | Fill #3

## 2015-09-18 ENCOUNTER — Ambulatory Visit: Payer: 59 | Admitting: Cardiology

## 2015-10-11 ENCOUNTER — Other Ambulatory Visit: Payer: Self-pay | Admitting: Cardiology

## 2015-10-11 MED FILL — ATORVASTATIN 20 MG TABLET: 20 | 90 days supply | Qty: 90 | Fill #0

## 2015-10-24 MED FILL — JARDIANCE 10 MG TABLET: 10 | 90 days supply | Qty: 90 | Fill #0 | Status: TO

## 2015-11-06 DIAGNOSIS — H25813 Combined forms of age-related cataract, bilateral: Secondary | ICD-10-CM | POA: Diagnosis not present

## 2015-11-06 DIAGNOSIS — E119 Type 2 diabetes mellitus without complications: Secondary | ICD-10-CM | POA: Diagnosis not present

## 2015-11-10 MED FILL — LOSARTAN POTASSIUM 100 MG T: 100 | 90 days supply | Qty: 90 | Fill #0

## 2015-11-13 MED FILL — DUREZOL 0.05% EYE DROPS: 0.05 | 30 days supply | Qty: 5 | Fill #0

## 2015-11-15 MED FILL — PROLENSA 0.07% EYE DROPS: 0.07 | 30 days supply | Qty: 3 | Fill #0

## 2015-11-15 MED FILL — BESIVANCE 0.6% SUSP: 0.6 | 30 days supply | Qty: 5 | Fill #0

## 2015-11-23 DIAGNOSIS — H25011 Cortical age-related cataract, right eye: Secondary | ICD-10-CM | POA: Diagnosis not present

## 2015-11-23 DIAGNOSIS — H25811 Combined forms of age-related cataract, right eye: Secondary | ICD-10-CM | POA: Diagnosis not present

## 2015-11-23 DIAGNOSIS — H2511 Age-related nuclear cataract, right eye: Secondary | ICD-10-CM | POA: Diagnosis not present

## 2015-11-29 DIAGNOSIS — H2512 Age-related nuclear cataract, left eye: Secondary | ICD-10-CM | POA: Diagnosis not present

## 2015-12-07 DIAGNOSIS — H2512 Age-related nuclear cataract, left eye: Secondary | ICD-10-CM | POA: Diagnosis not present

## 2015-12-07 MED FILL — BESIVANCE 0.6% SUSP: 0.6 | 50 days supply | Qty: 5 | Fill #0

## 2015-12-07 MED FILL — DUREZOL 0.05% EYE DROPS: 0.05 | 50 days supply | Qty: 5 | Fill #0

## 2015-12-12 MED FILL — OXYBUTYNIN CL ER 10 MG TAB: 10 | 90 days supply | Qty: 90 | Fill #1

## 2015-12-12 MED FILL — ATENOLOL 25 MG TABLET: 25 | 30 days supply | Qty: 60 | Fill #0

## 2015-12-14 DIAGNOSIS — Z471 Aftercare following joint replacement surgery: Secondary | ICD-10-CM | POA: Diagnosis not present

## 2015-12-14 DIAGNOSIS — Z96651 Presence of right artificial knee joint: Secondary | ICD-10-CM | POA: Diagnosis not present

## 2015-12-14 DIAGNOSIS — S92502A Displaced unspecified fracture of left lesser toe(s), initial encounter for closed fracture: Secondary | ICD-10-CM | POA: Diagnosis not present

## 2015-12-20 DIAGNOSIS — G5601 Carpal tunnel syndrome, right upper limb: Secondary | ICD-10-CM | POA: Diagnosis not present

## 2015-12-20 DIAGNOSIS — G5621 Lesion of ulnar nerve, right upper limb: Secondary | ICD-10-CM | POA: Diagnosis not present

## 2015-12-27 DIAGNOSIS — G5621 Lesion of ulnar nerve, right upper limb: Secondary | ICD-10-CM | POA: Diagnosis not present

## 2015-12-27 DIAGNOSIS — G5601 Carpal tunnel syndrome, right upper limb: Secondary | ICD-10-CM | POA: Diagnosis not present

## 2016-01-09 MED FILL — JARDIANCE 10 MG TABLET: 10 | 90 days supply | Qty: 90 | Fill #0

## 2016-01-09 MED FILL — ATORVASTATIN 20 MG TABLET: 20 | 90 days supply | Qty: 90 | Fill #1

## 2016-01-09 MED FILL — ATENOLOL 50 MG TABLET: 50 | 30 days supply | Qty: 30 | Fill #0

## 2016-01-10 MED FILL — YUVAFEM 10 MCG VAGINAL INSE: 10 | 84 days supply | Qty: 24 | Fill #1

## 2016-02-05 MED FILL — LOSARTAN POTASSIUM 100 MG T: 100 | 90 days supply | Qty: 90 | Fill #1

## 2016-02-05 MED FILL — ATENOLOL 50 MG TABLET: 50 | 30 days supply | Qty: 30 | Fill #1

## 2016-03-11 MED FILL — ATENOLOL 50 MG TABLET: 50 | 30 days supply | Qty: 30 | Fill #2

## 2016-03-11 MED FILL — OXYBUTYNIN CL ER 10 MG TAB: 10 | 90 days supply | Qty: 90 | Fill #2

## 2016-03-21 DIAGNOSIS — Z5181 Encounter for therapeutic drug level monitoring: Secondary | ICD-10-CM | POA: Diagnosis not present

## 2016-03-21 DIAGNOSIS — Z7984 Long term (current) use of oral hypoglycemic drugs: Secondary | ICD-10-CM | POA: Diagnosis not present

## 2016-03-21 DIAGNOSIS — Z6839 Body mass index (BMI) 39.0-39.9, adult: Secondary | ICD-10-CM | POA: Diagnosis not present

## 2016-03-21 DIAGNOSIS — E119 Type 2 diabetes mellitus without complications: Secondary | ICD-10-CM | POA: Diagnosis not present

## 2016-03-21 MED FILL — JARDIANCE 25 MG TABLET: 25 | 90 days supply | Qty: 90 | Fill #0

## 2016-03-28 MED FILL — ATORVASTATIN 20 MG TABLET: 20 | 90 days supply | Qty: 90 | Fill #2

## 2016-03-28 MED FILL — JANUVIA 100 MG TABLET: 100 | 90 days supply | Qty: 90 | Fill #2

## 2016-04-04 MED FILL — ATENOLOL 25 MG TABLET: 25 | 30 days supply | Qty: 60 | Fill #1

## 2016-04-18 ENCOUNTER — Encounter: Payer: Self-pay | Admitting: Skilled Nursing Facility1

## 2016-04-18 ENCOUNTER — Encounter: Payer: 59 | Attending: Internal Medicine | Admitting: Skilled Nursing Facility1

## 2016-04-18 DIAGNOSIS — E119 Type 2 diabetes mellitus without complications: Secondary | ICD-10-CM | POA: Insufficient documentation

## 2016-04-18 DIAGNOSIS — Z713 Dietary counseling and surveillance: Secondary | ICD-10-CM | POA: Diagnosis not present

## 2016-04-18 NOTE — Progress Notes (Signed)
Diabetes Self-Management Education  Visit Type: First/Initial  Appt. Start Time: 2:00 Appt. End Time: 3:21  04/18/2016  Ms. Jessica Bowers, identified by name and date of birth, is a 66 y.o. female with a diagnosis of Diabetes: Type 2.   ASSESSMENT  Height 5\' 6"  (1.676 m), weight 239 lb 9.6 oz (108.7 kg). Body mass index is 38.67 kg/m. Pt states she just retired this year. Pt states she has been doing wt wacters free style: described as taking all the carbohydrates out. Pt states her father died of complications from diabetes. Pt states she was doing isogenics diet. Pt states she has lost 40 pounds and wants to lose more. Pt states she has 1 or 2 meals a day. Pt states she started working with a  Trainer 3 days a week. Pt states she will try to marry the wt watchers diet and CHO control and come back to discuss that plan.      Diabetes Self-Management Education - 04/18/16 1411      Visit Information   Visit Type First/Initial     Initial Visit   Diabetes Type Type 2   Are you currently following a meal plan? Yes   What type of meal plan do you follow? wt watchers free style   Are you taking your medications as prescribed? Yes   Date Diagnosed many years ago     Health Coping   How would you rate your overall health? Good     Psychosocial Assessment   Patient Belief/Attitude about Diabetes Motivated to manage diabetes     Pre-Education Assessment   Patient understands the diabetes disease and treatment process. Needs Instruction   Patient understands incorporating nutritional management into lifestyle. Needs Instruction   Patient undertands incorporating physical activity into lifestyle. Needs Instruction   Patient understands using medications safely. Needs Instruction   Patient understands monitoring blood glucose, interpreting and using results Needs Instruction   Patient understands prevention, detection, and treatment of acute complications. Needs Instruction   Patient understands prevention, detection, and treatment of chronic complications. Needs Instruction   Patient understands how to develop strategies to address psychosocial issues. Needs Instruction   Patient understands how to develop strategies to promote health/change behavior. Needs Instruction     Complications   Last HgB A1C per patient/outside source 8 %   How often do you check your blood sugar? 0 times/day (not testing)   Have you had a dilated eye exam in the past 12 months? Yes   Have you had a dental exam in the past 12 months? Yes   Are you checking your feet? Yes   How many days per week are you checking your feet? 1     Dietary Intake   Breakfast protein shake----fruit----scrambled egg---ham   Lunch apple---grapes---clementime   Snack (afternoon) vegetables with chicken or shrimp or scallops    Beverage(s) coffee with stevia, water, sweet tea     Exercise   Exercise Type Moderate (swimming / aerobic walking)   How many days per week to you exercise? 3   How many minutes per day do you exercise? 60   Total minutes per week of exercise 180     Patient Education   Previous Diabetes Education No   Disease state  Definition of diabetes, type 1 and 2, and the diagnosis of diabetes;Factors that contribute to the development of diabetes   Nutrition management  Role of diet in the treatment of diabetes and the relationship between the three main  macronutrients and blood glucose level;Food label reading, portion sizes and measuring food.;Carbohydrate counting;Reviewed blood glucose goals for pre and post meals and how to evaluate the patients' food intake on their blood glucose level.   Physical activity and exercise  Role of exercise on diabetes management, blood pressure control and cardiac health.   Monitoring Taught/evaluated SMBG meter.;Purpose and frequency of SMBG.;Yearly dilated eye exam;Daily foot exams;Identified appropriate SMBG and/or A1C goals.   Acute complications  Taught treatment of hypoglycemia - the 15 rule.   Chronic complications Assessed and discussed foot care and prevention of foot problems;Retinopathy and reason for yearly dilated eye exams;Nephropathy, what it is, prevention of, the use of ACE, ARB's and early detection of through urine microalbumia.;Dental care   Psychosocial adjustment Role of stress on diabetes     Individualized Goals (developed by patient)   Nutrition Follow meal plan discussed   Physical Activity Exercise 1-2 times per week;15 minutes per day     Post-Education Assessment   Patient understands the diabetes disease and treatment process. Demonstrates understanding / competency   Patient understands incorporating nutritional management into lifestyle. Demonstrates understanding / competency   Patient undertands incorporating physical activity into lifestyle. Demonstrates understanding / competency   Patient understands using medications safely. Demonstrates understanding / competency   Patient understands monitoring blood glucose, interpreting and using results Demonstrates understanding / competency   Patient understands prevention, detection, and treatment of acute complications. Demonstrates understanding / competency   Patient understands prevention, detection, and treatment of chronic complications. Demonstrates understanding / competency   Patient understands how to develop strategies to address psychosocial issues. Demonstrates understanding / competency   Patient understands how to develop strategies to promote health/change behavior. Demonstrates understanding / competency     Outcomes   Expected Outcomes Demonstrated interest in learning. Expect positive outcomes   Future DMSE PRN   Program Status Completed      Individualized Plan for Diabetes Self-Management Training:   Learning Objective:  Patient will have a greater understanding of diabetes self-management. Patient education plan is to attend  individual and/or group sessions per assessed needs and concerns.   Plan:   Patient Instructions  -Always bring your meter with you everywhere you go -Always Properly dispose of your needles:  -Discard in a hard plastic/metal container with a lid (something the needle can't puncture)  -Write Do Not Recycle on the outside of the container  -Example: A laundry detergent bottle -Never use the same needle more than once -Eat 2-3 carbohydrate choices for each meal and 1 for each snack -A meal: carbohydrates, protein, vegetable -A snack: A Fruit OR Vegetable AND Protein  -Try to be more active -Always pay attention to your body keeping watchful of possible low blood sugar (below 70) or high blood sugar (above 200)  -Check your feet every day looking for anything that was not there the day before  -Try to check you sugar every day: once before you eat anything in the morning (80-130) OR 2 hours after a meal (80-180)  -Call your doctor for a new prescription for your testing strips and needle drum: take advanatge of your benefits    Expected Outcomes:  Demonstrated interest in learning. Expect positive outcomes  Education material provided: Living Well with Diabetes, Meal plan card, My Plate and Snack sheet  If problems or questions, patient to contact team via:  Phone  Future DSME appointment: PRN

## 2016-04-18 NOTE — Patient Instructions (Addendum)
-  Always bring your meter with you everywhere you go -Always Properly dispose of your needles:  -Discard in a hard plastic/metal container with a lid (something the needle can't puncture)  -Write Do Not Recycle on the outside of the container  -Example: A laundry detergent bottle -Never use the same needle more than once -Eat 2-3 carbohydrate choices for each meal and 1 for each snack -A meal: carbohydrates, protein, vegetable -A snack: A Fruit OR Vegetable AND Protein  -Try to be more active -Always pay attention to your body keeping watchful of possible low blood sugar (below 70) or high blood sugar (above 200)  -Check your feet every day looking for anything that was not there the day before  -Try to check you sugar every day: once before you eat anything in the morning (80-130) OR 2 hours after a meal (80-180)  -Call your doctor for a new prescription for your testing strips and needle drum: take advanatge of your benefits

## 2016-05-06 MED FILL — ATENOLOL 25 MG TABLET: 25 | 30 days supply | Qty: 60 | Fill #2

## 2016-05-06 MED FILL — LOSARTAN POTASSIUM 100 MG T: 100 | 90 days supply | Qty: 90 | Fill #2

## 2016-05-28 ENCOUNTER — Other Ambulatory Visit (HOSPITAL_BASED_OUTPATIENT_CLINIC_OR_DEPARTMENT_OTHER): Payer: Self-pay | Admitting: Obstetrics and Gynecology

## 2016-05-28 DIAGNOSIS — Z1231 Encounter for screening mammogram for malignant neoplasm of breast: Secondary | ICD-10-CM

## 2016-05-30 ENCOUNTER — Encounter: Payer: 59 | Attending: Internal Medicine | Admitting: Skilled Nursing Facility1

## 2016-05-30 ENCOUNTER — Encounter: Payer: Self-pay | Admitting: Skilled Nursing Facility1

## 2016-05-30 DIAGNOSIS — E119 Type 2 diabetes mellitus without complications: Secondary | ICD-10-CM

## 2016-05-30 DIAGNOSIS — Z713 Dietary counseling and surveillance: Secondary | ICD-10-CM | POA: Insufficient documentation

## 2016-05-30 NOTE — Progress Notes (Signed)
  Diabetes Self-Management Education   Pt returns having lost 11 pounds. Pt states she has been checking her sugars every day fasting:116 all under 130 for the month. the patient states she is using [employer]. Apple and cinnamon rice cakes caused a higher number in the morning. Pt states she has been working with wt watchers and including carbohydrate counting in it  Pt states her daughter is getting married in April. Working out with a Clinical research associate 3 days a week. Started 1 percent milk. Pt states the lowest blood sugar she has gotten was 99.   Diet Recall:  cereal,  apple and peanut butter,  chicken wrap and sweet potato fries  Pt Goals: -Keep doing the great work! -Join [place] for support group after the wedding   Handouts: -Type 2 Diabetes support group

## 2016-06-04 ENCOUNTER — Ambulatory Visit (HOSPITAL_BASED_OUTPATIENT_CLINIC_OR_DEPARTMENT_OTHER)
Admission: RE | Admit: 2016-06-04 | Discharge: 2016-06-04 | Disposition: A | Discharge status: Home / Self Care | Payer: 59 | Source: Ambulatory Visit | Attending: Obstetrics and Gynecology | Admitting: Obstetrics and Gynecology

## 2016-06-04 ENCOUNTER — Encounter (HOSPITAL_BASED_OUTPATIENT_CLINIC_OR_DEPARTMENT_OTHER): Payer: Self-pay

## 2016-06-04 DIAGNOSIS — Z1231 Encounter for screening mammogram for malignant neoplasm of breast: Secondary | ICD-10-CM | POA: Diagnosis not present

## 2016-06-18 MED FILL — GAVILYTE-N SOLUTION: 420 | 1 days supply | Qty: 4000 | Fill #0

## 2016-06-19 DIAGNOSIS — I10 Essential (primary) hypertension: Secondary | ICD-10-CM | POA: Diagnosis not present

## 2016-06-19 DIAGNOSIS — Z6837 Body mass index (BMI) 37.0-37.9, adult: Secondary | ICD-10-CM | POA: Diagnosis not present

## 2016-06-19 DIAGNOSIS — Z5181 Encounter for therapeutic drug level monitoring: Secondary | ICD-10-CM | POA: Diagnosis not present

## 2016-06-19 DIAGNOSIS — E119 Type 2 diabetes mellitus without complications: Secondary | ICD-10-CM | POA: Diagnosis not present

## 2016-06-19 DIAGNOSIS — Z7984 Long term (current) use of oral hypoglycemic drugs: Secondary | ICD-10-CM | POA: Diagnosis not present

## 2016-06-20 IMAGING — MG MM SCREENING BREAST TOMO BILAT
6 of 10 series · 6 of 30 positions shown · non-contrast
Comparison: Previous exam(s).

CLINICAL DATA: Screening.

EXAM:
DIGITAL SCREENING BILATERAL MAMMOGRAM WITH 3D TOMO WITH CAD

[R CC (1 of 2)]
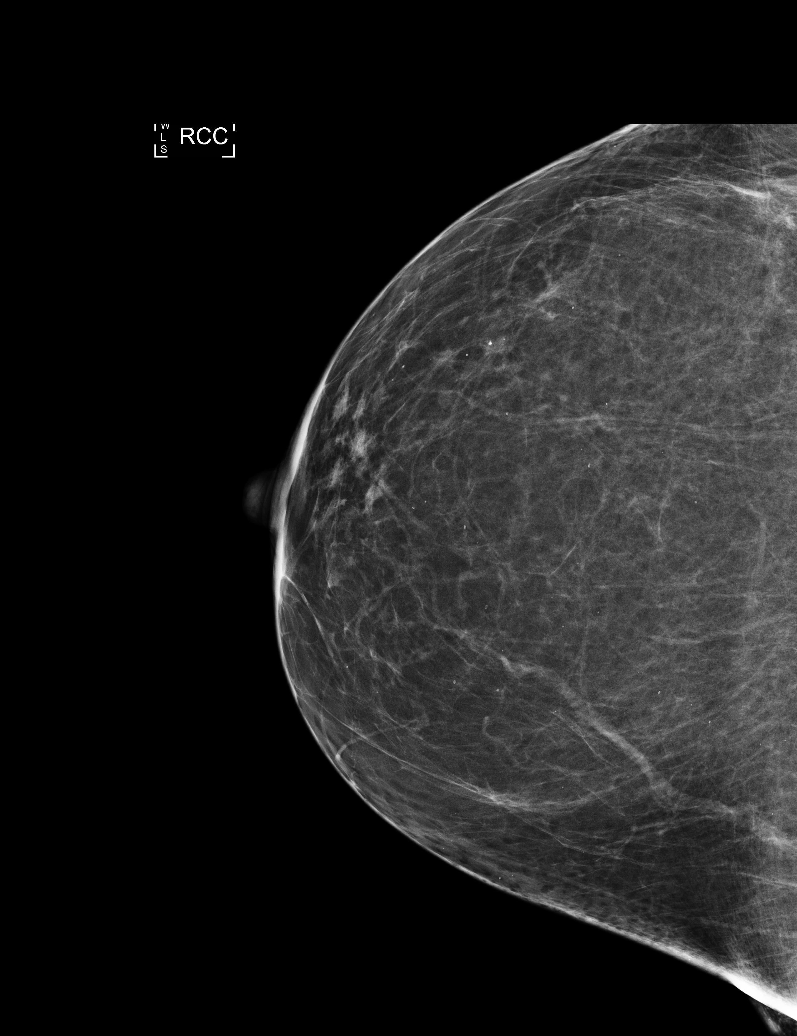

[R CC (2 of 2)]
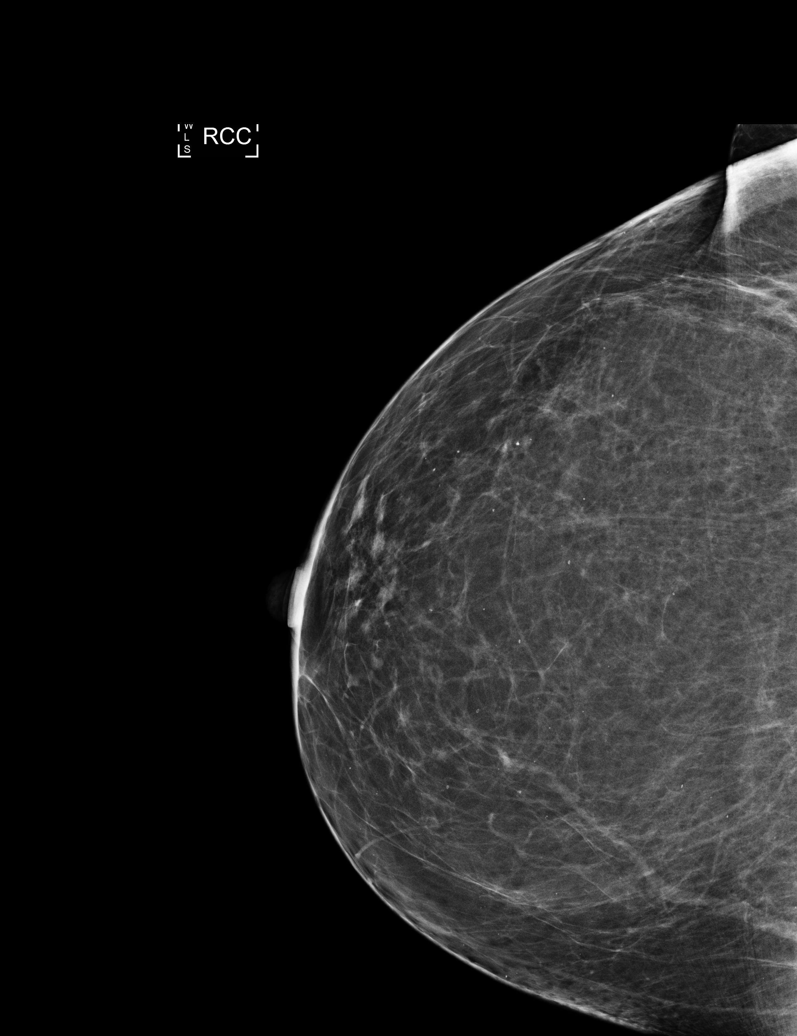

[L MLO]
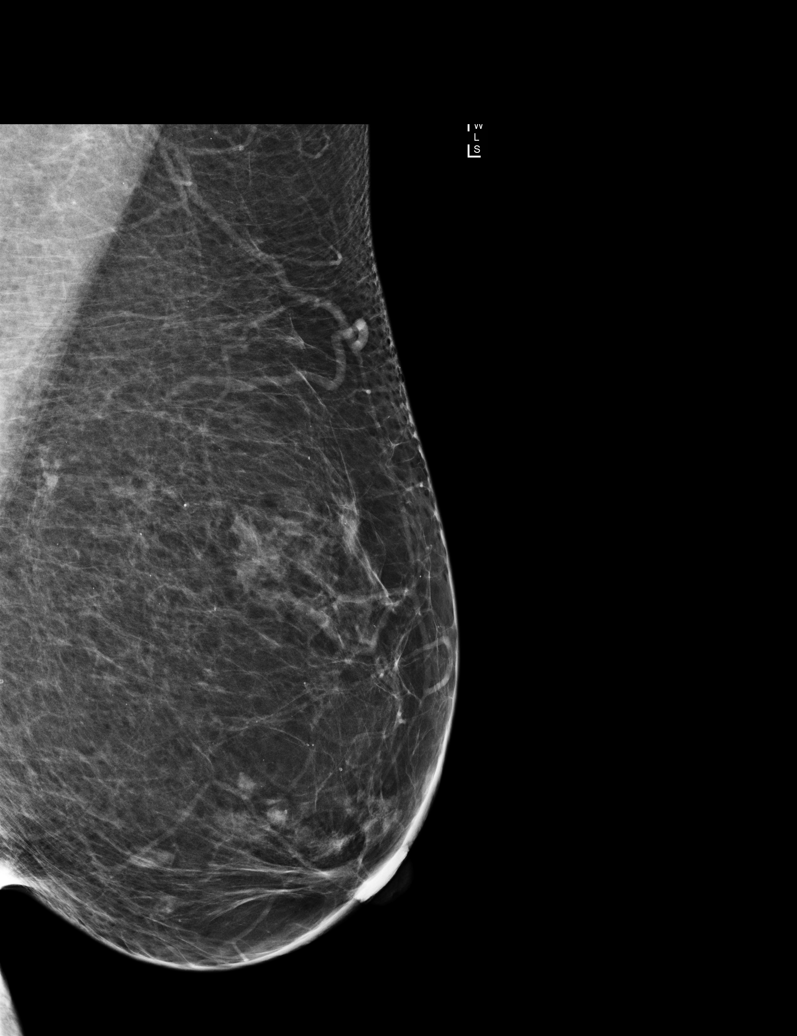

[R MLO]
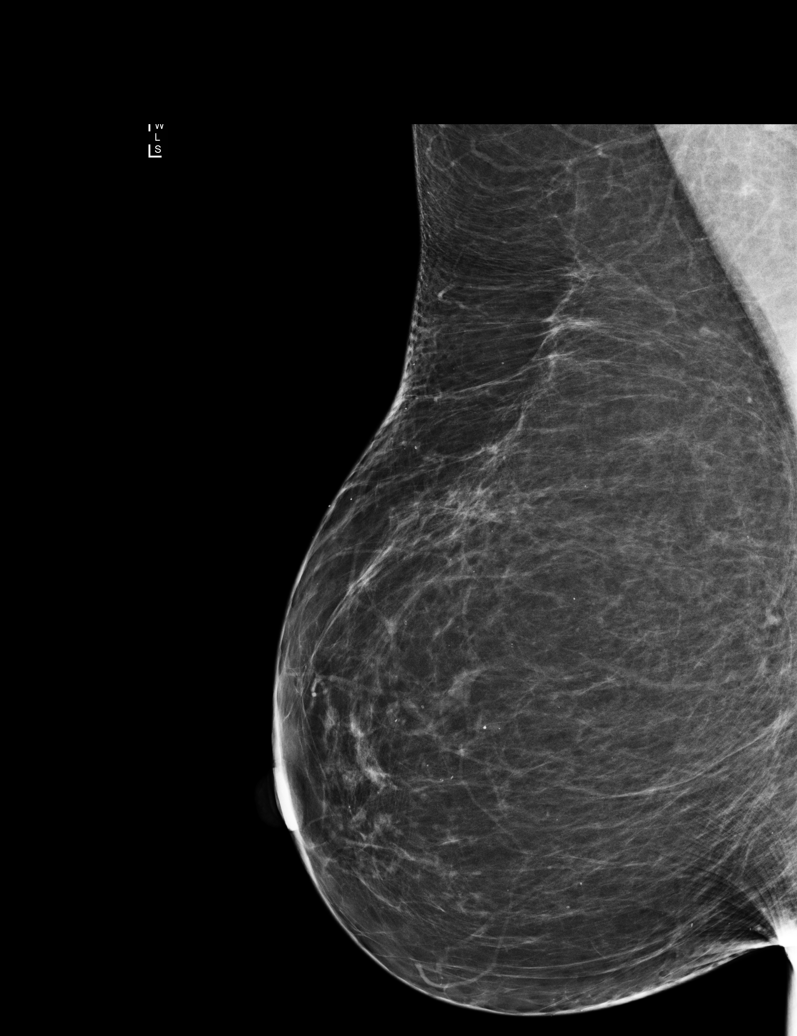

[L CC]
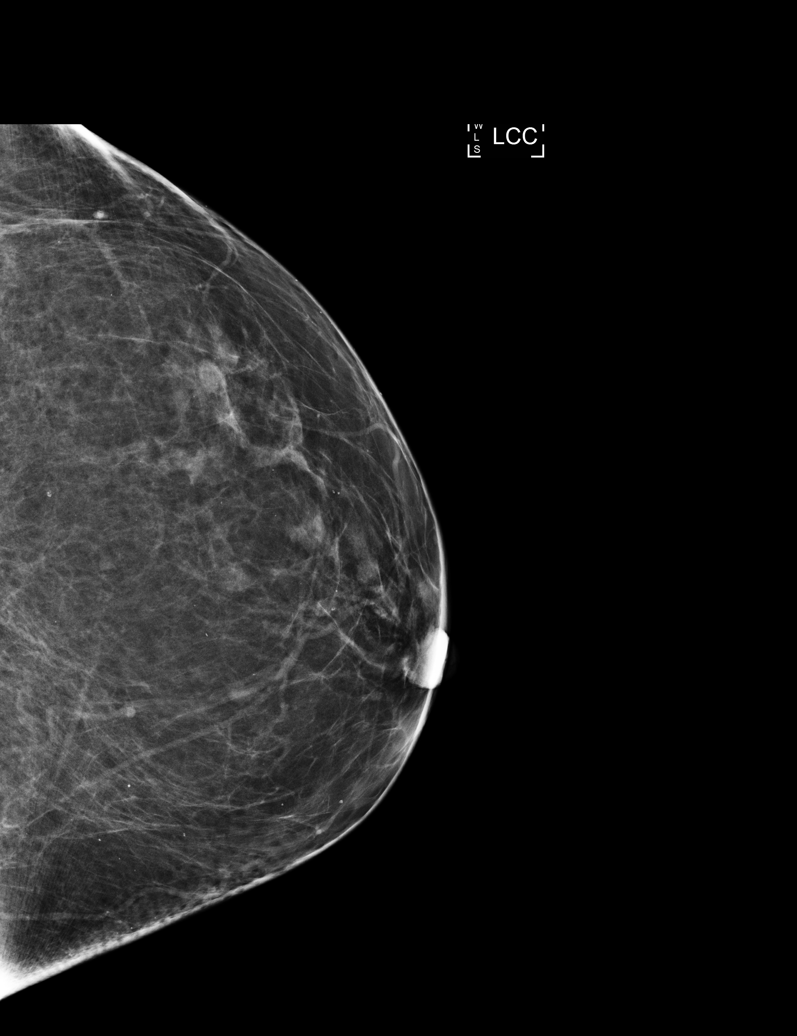

[L MLO tomo · tomo slice 40/79.0]
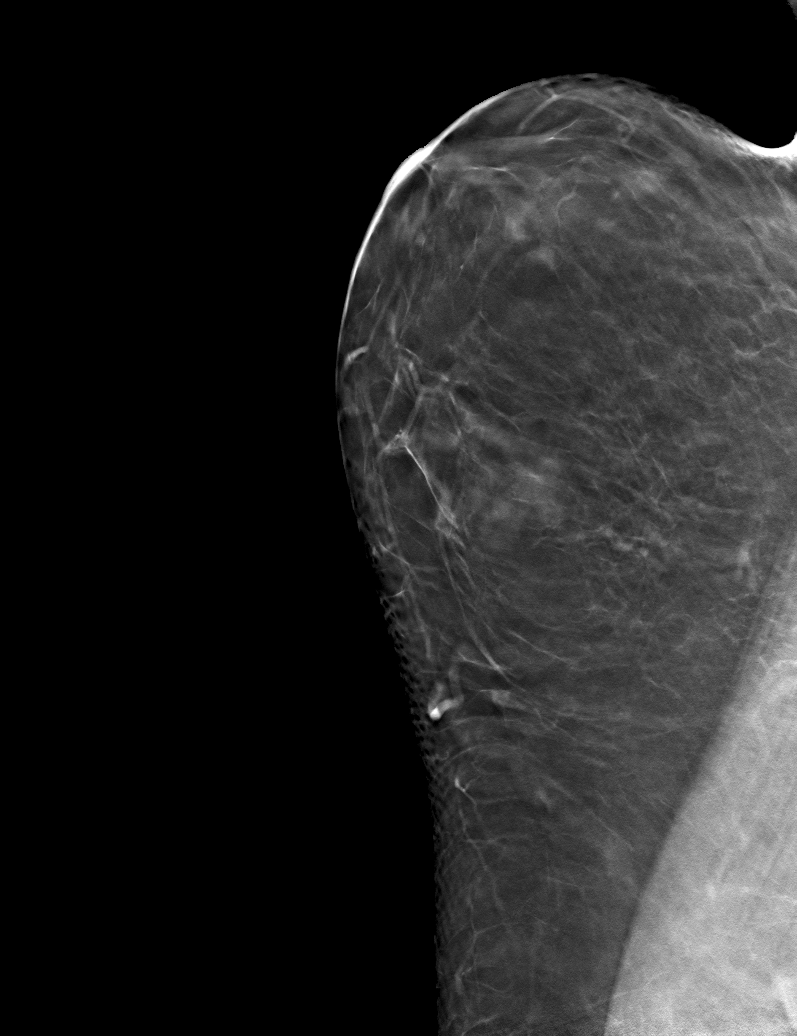

[6 of 30 positions shown; findings below may reference images not displayed]

ACR Breast Density Category b: There are scattered areas of
fibroglandular density.
FINDINGS: There are no findings suspicious for malignancy. Images were
processed with CAD.
IMPRESSION: No mammographic evidence of malignancy. A result letter of this
screening mammogram will be mailed directly to the patient.

RECOMMENDATION:
Screening mammogram in one year. (Code:55-L-23V)

BI-RADS CATEGORY  1: Negative.

## 2016-06-21 DIAGNOSIS — Z1211 Encounter for screening for malignant neoplasm of colon: Secondary | ICD-10-CM | POA: Diagnosis not present

## 2016-06-21 DIAGNOSIS — D122 Benign neoplasm of ascending colon: Secondary | ICD-10-CM | POA: Diagnosis not present

## 2016-06-21 DIAGNOSIS — D120 Benign neoplasm of cecum: Secondary | ICD-10-CM | POA: Diagnosis not present

## 2016-06-28 ENCOUNTER — Other Ambulatory Visit: Payer: Self-pay | Admitting: Cardiology

## 2016-06-28 MED FILL — ATENOLOL 25 MG TABLET: 25 | 30 days supply | Qty: 60 | Fill #3

## 2016-06-28 MED FILL — ATORVASTATIN 20 MG TABLET: 20 | 30 days supply | Qty: 30 | Fill #0

## 2016-06-28 MED FILL — JARDIANCE 25 MG TABLET: 25 | 90 days supply | Qty: 90 | Fill #1

## 2016-07-05 MED FILL — OSELTAMIVIR PHOSPHATE 75 MG: 75 | 10 days supply | Qty: 10 | Fill #0

## 2016-07-19 MED FILL — OXYBUTYNIN CL ER 10 MG TAB: 10 | 90 days supply | Qty: 90 | Fill #3

## 2016-08-08 DIAGNOSIS — L821 Other seborrheic keratosis: Secondary | ICD-10-CM | POA: Diagnosis not present

## 2016-08-08 DIAGNOSIS — L814 Other melanin hyperpigmentation: Secondary | ICD-10-CM | POA: Diagnosis not present

## 2016-08-08 DIAGNOSIS — L820 Inflamed seborrheic keratosis: Secondary | ICD-10-CM | POA: Diagnosis not present

## 2016-08-14 ENCOUNTER — Other Ambulatory Visit: Payer: Self-pay | Admitting: Cardiology

## 2016-08-14 MED FILL — LOSARTAN POTASSIUM 100 MG T: 100 | 90 days supply | Qty: 90 | Fill #3

## 2016-08-14 NOTE — Telephone Encounter (Signed)
atorvastatin (LIPITOR) 20 MG tablet  Medication  Date: 06/28/2016 Department: [facility] [address] Office Ordering/Authorizing: Jerline Pain, MD  Order Providers   Prescribing Provider Encounter Provider  Jerline Pain, MD Jerline Pain, MD  Medication Detail    Disp Refills Start End   atorvastatin (LIPITOR) 20 MG tablet 30 tablet 0 06/28/2016    Sig: TAKE 1 TABLET BY MOUTH ONCE DAILY (INCREASE IN DOSE ON NEXT REFILL AS DIRECTED)   Notes to Pharmacy: Please call our office to schedule an overdue yearly appointment before anymore refills. [phone]. Thank You 1st attempt   E-Prescribing Status: Receipt confirmed by pharmacy (06/28/2016 2:13 PM EDT)   [place], [place]

## 2016-08-19 MED FILL — ATORVASTATIN 20 MG TABLET: 20 | 30 days supply | Qty: 30 | Fill #0

## 2016-09-05 MED FILL — ATENOLOL 25 MG TABLET: 25 | 30 days supply | Qty: 60 | Fill #4

## 2016-09-11 DIAGNOSIS — Z6837 Body mass index (BMI) 37.0-37.9, adult: Secondary | ICD-10-CM | POA: Diagnosis not present

## 2016-09-11 DIAGNOSIS — Z01419 Encounter for gynecological examination (general) (routine) without abnormal findings: Secondary | ICD-10-CM | POA: Diagnosis not present

## 2016-09-12 MED FILL — JANUVIA 100 MG TABLET: 100 | 90 days supply | Qty: 90 | Fill #0

## 2016-09-12 MED FILL — ATORVASTATIN 20 MG TABLET: 20 | 30 days supply | Qty: 30 | Fill #1

## 2016-09-17 DIAGNOSIS — Z961 Presence of intraocular lens: Secondary | ICD-10-CM | POA: Diagnosis not present

## 2016-09-17 DIAGNOSIS — H26493 Other secondary cataract, bilateral: Secondary | ICD-10-CM | POA: Diagnosis not present

## 2016-09-17 DIAGNOSIS — E119 Type 2 diabetes mellitus without complications: Secondary | ICD-10-CM | POA: Diagnosis not present

## 2016-10-03 MED FILL — ATENOLOL 25 MG TABLET: 25 | 30 days supply | Qty: 60 | Fill #5

## 2016-10-11 MED FILL — OXYBUTYNIN CL ER 10 MG TAB: 10 | 90 days supply | Qty: 90 | Fill #0

## 2016-10-17 MED FILL — JARDIANCE 25 MG TABLET: 25 | 90 days supply | Qty: 90 | Fill #2

## 2016-10-21 MED FILL — ATORVASTATIN 20 MG TABLET: 20 | 90 days supply | Qty: 90 | Fill #2

## 2016-10-28 MED FILL — AMOXICILLIN 500 MG CAPSULE: 500 | 3 days supply | Qty: 12 | Fill #0

## 2016-11-04 MED FILL — ATENOLOL 25 MG TABLET: 25 | 30 days supply | Qty: 60 | Fill #6

## 2016-11-13 MED FILL — LOSARTAN POTASSIUM 100 MG T: 100 | 90 days supply | Qty: 90 | Fill #0

## 2016-12-02 MED FILL — ATENOLOL 50 MG TABLET: 50 | 90 days supply | Qty: 90 | Fill #0

## 2016-12-04 MED FILL — YUVAFEM 10 MCG VAGINAL INSE: 10 | 28 days supply | Qty: 8 | Fill #0

## 2016-12-11 MED FILL — JANUVIA 100 MG TABLET: 100 | 90 days supply | Qty: 90 | Fill #1

## 2017-01-10 MED FILL — JARDIANCE 25 MG TABLET: 25 | 90 days supply | Qty: 90 | Fill #3

## 2017-01-13 MED FILL — OXYBUTYNIN CL ER 10 MG TAB: 10 | 90 days supply | Qty: 90 | Fill #0

## 2017-01-22 DIAGNOSIS — E119 Type 2 diabetes mellitus without complications: Secondary | ICD-10-CM | POA: Diagnosis not present

## 2017-01-22 DIAGNOSIS — Z5181 Encounter for therapeutic drug level monitoring: Secondary | ICD-10-CM | POA: Diagnosis not present

## 2017-01-22 DIAGNOSIS — I10 Essential (primary) hypertension: Secondary | ICD-10-CM | POA: Diagnosis not present

## 2017-01-22 DIAGNOSIS — Z23 Encounter for immunization: Secondary | ICD-10-CM | POA: Diagnosis not present

## 2017-01-30 MED FILL — ATORVASTATIN 20 MG TABLET: 20 | 90 days supply | Qty: 90 | Fill #0

## 2017-01-30 MED FILL — AMOXICILLIN 500 MG CAPSULE: 500 | 3 days supply | Qty: 12 | Fill #1

## 2017-02-18 MED FILL — LOSARTAN POTASSIUM 100 MG T: 100 | 90 days supply | Qty: 90 | Fill #1

## 2017-03-03 DIAGNOSIS — M542 Cervicalgia: Secondary | ICD-10-CM | POA: Diagnosis not present

## 2017-03-03 MED FILL — NAPROXEN 500 MG TABLET: 500 | 15 days supply | Qty: 30 | Fill #0

## 2017-03-03 MED FILL — CYCLOBENZAPRINE 5 MG TABLET: 5 | 10 days supply | Qty: 30 | Fill #0

## 2017-03-08 MED FILL — ATENOLOL 50 MG TABLET: 50 | 90 days supply | Qty: 90 | Fill #1

## 2017-03-10 MED FILL — JANUVIA 100 MG TABLET: 100 | 90 days supply | Qty: 90 | Fill #2

## 2017-04-13 MED FILL — OXYBUTYNIN CL ER 10 MG TAB: 10 | 90 days supply | Qty: 90 | Fill #1

## 2017-04-14 MED FILL — JARDIANCE 25 MG TABLET: 25 | 90 days supply | Qty: 90 | Fill #0

## 2017-05-06 MED FILL — ATORVASTATIN 20 MG TABLET: 20 | 90 days supply | Qty: 90 | Fill #1

## 2017-05-27 MED FILL — LOSARTAN POTASSIUM 100 MG T: 100 | 90 days supply | Qty: 90 | Fill #2

## 2017-05-29 ENCOUNTER — Other Ambulatory Visit (HOSPITAL_BASED_OUTPATIENT_CLINIC_OR_DEPARTMENT_OTHER): Payer: Self-pay | Admitting: Obstetrics and Gynecology

## 2017-05-29 DIAGNOSIS — Z1231 Encounter for screening mammogram for malignant neoplasm of breast: Secondary | ICD-10-CM

## 2017-06-02 MED FILL — ATENOLOL 50 MG TABLET: 50 | 90 days supply | Qty: 90 | Fill #2

## 2017-06-02 MED FILL — JANUVIA 100 MG TABLET: 100 | 90 days supply | Qty: 90 | Fill #0

## 2017-06-06 MED FILL — ESTRADIOL 10 MCG TABS: 10 | 28 days supply | Qty: 8 | Fill #0

## 2017-06-19 ENCOUNTER — Ambulatory Visit (HOSPITAL_BASED_OUTPATIENT_CLINIC_OR_DEPARTMENT_OTHER)
Admission: RE | Admit: 2017-06-19 | Discharge: 2017-06-19 | Disposition: A | Discharge status: Home / Self Care | Payer: No Typology Code available for payment source | Source: Ambulatory Visit | Attending: Obstetrics and Gynecology | Admitting: Obstetrics and Gynecology

## 2017-06-19 DIAGNOSIS — Z1231 Encounter for screening mammogram for malignant neoplasm of breast: Secondary | ICD-10-CM | POA: Diagnosis present

## 2017-07-07 MED FILL — JARDIANCE 25 MG TABLET: 25 | 90 days supply | Qty: 90 | Fill #1

## 2017-07-28 MED FILL — OXYBUTYNIN CL ER 10 MG TAB: 10 | 90 days supply | Qty: 90 | Fill #0

## 2017-07-29 MED FILL — ESTRADIOL 10 MCG TABS: 10 | 28 days supply | Qty: 8 | Fill #1

## 2017-08-10 MED FILL — ATORVASTATIN 20 MG TABLET: 20 | 90 days supply | Qty: 90 | Fill #2

## 2017-09-04 MED FILL — ESTRADIOL 10 MCG TABS: 10 | 28 days supply | Qty: 8 | Fill #2

## 2017-09-04 MED FILL — LOSARTAN POTASSIUM 100 MG T: 100 | 90 days supply | Qty: 90 | Fill #3

## 2017-09-04 MED FILL — ATENOLOL 50 MG TABLET: 50 | 90 days supply | Qty: 90 | Fill #0

## 2017-09-22 MED FILL — JANUVIA 100 MG TABLET: 100 | 90 days supply | Qty: 90 | Fill #1

## 2017-10-03 MED FILL — JARDIANCE 25 MG TABLET: 25 | 90 days supply | Qty: 90 | Fill #2

## 2017-10-03 MED FILL — AMOXICILLIN 500 MG CAPSULE: 500 | 3 days supply | Qty: 12 | Fill #2

## 2017-10-03 MED FILL — OXYBUTYNIN CL ER 10 MG TAB: 10 | 90 days supply | Qty: 90 | Fill #0

## 2017-10-29 DIAGNOSIS — H26493 Other secondary cataract, bilateral: Secondary | ICD-10-CM | POA: Diagnosis not present

## 2017-10-29 DIAGNOSIS — H43811 Vitreous degeneration, right eye: Secondary | ICD-10-CM | POA: Diagnosis not present

## 2017-10-29 DIAGNOSIS — E119 Type 2 diabetes mellitus without complications: Secondary | ICD-10-CM | POA: Diagnosis not present

## 2017-10-29 DIAGNOSIS — Z961 Presence of intraocular lens: Secondary | ICD-10-CM | POA: Diagnosis not present

## 2017-10-30 MED FILL — OXYBUTYNIN CL ER 10 MG TAB: 10 | 90 days supply | Qty: 90 | Fill #0

## 2017-11-06 MED FILL — ATORVASTATIN CALCIUM 20 MG: 20 | 90 days supply | Qty: 90 | Fill #3

## 2017-11-14 DIAGNOSIS — Z6836 Body mass index (BMI) 36.0-36.9, adult: Secondary | ICD-10-CM | POA: Diagnosis not present

## 2017-11-14 DIAGNOSIS — Z01419 Encounter for gynecological examination (general) (routine) without abnormal findings: Secondary | ICD-10-CM | POA: Diagnosis not present

## 2017-12-04 MED FILL — LOSARTAN POTASSIUM 100 MG T: 100 | 90 days supply | Qty: 90 | Fill #0

## 2017-12-04 MED FILL — ATENOLOL 50 MG TABLET: 50 | 90 days supply | Qty: 90 | Fill #1

## 2017-12-10 DIAGNOSIS — H26493 Other secondary cataract, bilateral: Secondary | ICD-10-CM | POA: Diagnosis not present

## 2017-12-10 DIAGNOSIS — H43811 Vitreous degeneration, right eye: Secondary | ICD-10-CM | POA: Diagnosis not present

## 2017-12-10 DIAGNOSIS — E119 Type 2 diabetes mellitus without complications: Secondary | ICD-10-CM | POA: Diagnosis not present

## 2017-12-10 DIAGNOSIS — Z961 Presence of intraocular lens: Secondary | ICD-10-CM | POA: Diagnosis not present

## 2017-12-22 MED FILL — JANUVIA 100 MG TABLET: 100 | 90 days supply | Qty: 90 | Fill #2

## 2018-01-15 MED FILL — JARDIANCE 25 MG TABLET: 25 | 90 days supply | Qty: 90 | Fill #3

## 2018-02-09 MED FILL — ATORVASTATIN CALCIUM 20 MG: 20 | 90 days supply | Qty: 90 | Fill #0

## 2018-02-10 DIAGNOSIS — H01022 Squamous blepharitis right lower eyelid: Secondary | ICD-10-CM | POA: Diagnosis not present

## 2018-02-10 DIAGNOSIS — H26493 Other secondary cataract, bilateral: Secondary | ICD-10-CM | POA: Diagnosis not present

## 2018-02-10 DIAGNOSIS — H01025 Squamous blepharitis left lower eyelid: Secondary | ICD-10-CM | POA: Diagnosis not present

## 2018-02-10 DIAGNOSIS — H00025 Hordeolum internum left lower eyelid: Secondary | ICD-10-CM | POA: Diagnosis not present

## 2018-02-10 DIAGNOSIS — H01021 Squamous blepharitis right upper eyelid: Secondary | ICD-10-CM | POA: Diagnosis not present

## 2018-02-10 DIAGNOSIS — H43812 Vitreous degeneration, left eye: Secondary | ICD-10-CM | POA: Diagnosis not present

## 2018-02-10 DIAGNOSIS — H01024 Squamous blepharitis left upper eyelid: Secondary | ICD-10-CM | POA: Diagnosis not present

## 2018-02-10 DIAGNOSIS — Z961 Presence of intraocular lens: Secondary | ICD-10-CM | POA: Diagnosis not present

## 2018-02-10 MED FILL — DOXYCYCLINE HYCLATE 100 MG: 100 | 10 days supply | Qty: 20 | Fill #0

## 2018-02-25 MED FILL — DOXYCYCLINE HYCLATE 100 MG: 100 | 10 days supply | Qty: 20 | Fill #0

## 2018-03-03 MED FILL — LOSARTAN POTASSIUM 100 MG T: 100 | 30 days supply | Qty: 30 | Fill #1

## 2018-03-11 MED FILL — ATENOLOL 50 MG TABLET: 50 | 90 days supply | Qty: 90 | Fill #2

## 2018-03-26 MED FILL — JANUVIA 100 MG TABLET: 100 | 30 days supply | Qty: 30 | Fill #0

## 2018-03-29 DIAGNOSIS — N390 Urinary tract infection, site not specified: Secondary | ICD-10-CM | POA: Diagnosis not present

## 2018-04-03 MED FILL — LOSARTAN POTASSIUM 100 MG T: 100 | 30 days supply | Qty: 30 | Fill #2

## 2018-04-03 MED FILL — OXYBUTYNIN CL ER 10 MG TAB: 10 | 90 days supply | Qty: 90 | Fill #0

## 2018-04-17 MED FILL — JARDIANCE 25 MG TABLET: 25 | 90 days supply | Qty: 90 | Fill #0

## 2018-04-24 DIAGNOSIS — I10 Essential (primary) hypertension: Secondary | ICD-10-CM | POA: Diagnosis not present

## 2018-04-24 DIAGNOSIS — Z5181 Encounter for therapeutic drug level monitoring: Secondary | ICD-10-CM | POA: Diagnosis not present

## 2018-04-24 DIAGNOSIS — E119 Type 2 diabetes mellitus without complications: Secondary | ICD-10-CM | POA: Diagnosis not present

## 2018-04-24 MED FILL — TRULICITY 0.75 MG/0.5 ML PE: 0.75 | 28 days supply | Qty: 2 | Fill #0

## 2018-04-30 MED FILL — LOSARTAN POTASSIUM 100 MG T: 100 | 30 days supply | Qty: 30 | Fill #3

## 2018-05-07 MED FILL — ATORVASTATIN CALCIUM 20 MG: 20 | 90 days supply | Qty: 90 | Fill #1

## 2018-05-19 MED FILL — TRULICITY 0.75 MG/0.5 ML PE: 0.75 | 28 days supply | Qty: 2 | Fill #1

## 2018-06-12 MED FILL — ATENOLOL 50 MG TABLET: 50 | 90 days supply | Qty: 90 | Fill #3

## 2018-06-12 MED FILL — LOSARTAN POTASSIUM 100 MG T: 100 | 30 days supply | Qty: 30 | Fill #4

## 2018-06-16 MED FILL — TRULICITY 0.75 MG/0.5 ML PE: 0.75 | 28 days supply | Qty: 2 | Fill #2

## 2018-07-07 MED FILL — LOSARTAN POTASSIUM 100 MG T: 100 | 30 days supply | Qty: 30 | Fill #5

## 2018-07-09 MED FILL — TRULICITY 0.75 MG/0.5 ML PE: 0.75 | 28 days supply | Qty: 2 | Fill #3

## 2018-07-24 MED FILL — ESTRADIOL 10 MCG TABS: 10 | 28 days supply | Qty: 8 | Fill #0

## 2018-07-28 DIAGNOSIS — E119 Type 2 diabetes mellitus without complications: Secondary | ICD-10-CM | POA: Diagnosis not present

## 2018-07-28 DIAGNOSIS — Z5181 Encounter for therapeutic drug level monitoring: Secondary | ICD-10-CM | POA: Diagnosis not present

## 2018-07-28 DIAGNOSIS — I10 Essential (primary) hypertension: Secondary | ICD-10-CM | POA: Diagnosis not present

## 2018-07-28 MED FILL — OXYBUTYNIN CL ER 10 MG TAB: 10 | 90 days supply | Qty: 90 | Fill #0

## 2018-07-28 MED FILL — JARDIANCE 25 MG TABLET: 25 | 90 days supply | Qty: 90 | Fill #1

## 2018-08-05 MED FILL — LOSARTAN POTASSIUM 100 MG T: 100 | 30 days supply | Qty: 30 | Fill #6

## 2018-08-13 MED FILL — ATORVASTATIN 20 MG TABLET: 20 | 90 days supply | Qty: 90 | Fill #2

## 2018-08-13 MED FILL — LOSARTAN POTASSIUM 100 MG T: 100 | 30 days supply | Qty: 30 | Fill #6

## 2018-09-04 MED FILL — ATENOLOL 50 MG TABLET: 50 | 90 days supply | Qty: 90 | Fill #0

## 2018-09-14 MED FILL — LOSARTAN POTASSIUM 100 MG T: 100 | 30 days supply | Qty: 30 | Fill #7

## 2018-10-08 DIAGNOSIS — E119 Type 2 diabetes mellitus without complications: Secondary | ICD-10-CM | POA: Diagnosis not present

## 2018-10-08 DIAGNOSIS — I10 Essential (primary) hypertension: Secondary | ICD-10-CM | POA: Diagnosis not present

## 2018-10-08 DIAGNOSIS — Z5181 Encounter for therapeutic drug level monitoring: Secondary | ICD-10-CM | POA: Diagnosis not present

## 2018-10-12 DIAGNOSIS — E119 Type 2 diabetes mellitus without complications: Secondary | ICD-10-CM | POA: Diagnosis not present

## 2018-10-12 DIAGNOSIS — Z79899 Other long term (current) drug therapy: Secondary | ICD-10-CM | POA: Diagnosis not present

## 2018-10-12 MED FILL — LOSARTAN POTASSIUM 100 MG T: 100 | 30 days supply | Qty: 30 | Fill #8

## 2018-10-22 MED FILL — TRULICITY 0.75 MG/0.5 ML PE: 0.75 | 28 days supply | Qty: 2 | Fill #0

## 2018-10-30 MED FILL — OXYBUTYNIN CL ER 10 MG TAB: 10 | 90 days supply | Qty: 90 | Fill #0

## 2018-10-30 MED FILL — JARDIANCE 25 MG TABLET: 25 | 90 days supply | Qty: 90 | Fill #2

## 2018-11-03 DIAGNOSIS — E119 Type 2 diabetes mellitus without complications: Secondary | ICD-10-CM | POA: Diagnosis not present

## 2018-11-03 DIAGNOSIS — H0102A Squamous blepharitis right eye, upper and lower eyelids: Secondary | ICD-10-CM | POA: Diagnosis not present

## 2018-11-03 DIAGNOSIS — Z961 Presence of intraocular lens: Secondary | ICD-10-CM | POA: Diagnosis not present

## 2018-11-03 DIAGNOSIS — H0102B Squamous blepharitis left eye, upper and lower eyelids: Secondary | ICD-10-CM | POA: Diagnosis not present

## 2018-11-03 DIAGNOSIS — H43813 Vitreous degeneration, bilateral: Secondary | ICD-10-CM | POA: Diagnosis not present

## 2018-11-03 DIAGNOSIS — H26493 Other secondary cataract, bilateral: Secondary | ICD-10-CM | POA: Diagnosis not present

## 2018-11-13 MED FILL — ATORVASTATIN 20 MG TABLET: 20 | 90 days supply | Qty: 90 | Fill #3

## 2018-11-14 MED FILL — LOSARTAN POTASSIUM 100 MG T: 100 | 30 days supply | Qty: 30 | Fill #9

## 2018-11-16 MED FILL — AMOXICILLIN 500 MG CAPSULE: 500 | 3 days supply | Qty: 12 | Fill #0

## 2018-11-20 MED FILL — TRULICITY 0.75 MG/0.5 ML PE: 0.75 | 28 days supply | Qty: 2 | Fill #1

## 2018-11-30 MED FILL — OXYBUTYNIN CL ER 10 MG TAB: 10 | 90 days supply | Qty: 90 | Fill #0

## 2018-12-02 MED FILL — ESTRADIOL 10 MCG TABS: 10 | 28 days supply | Qty: 8 | Fill #1

## 2018-12-11 MED FILL — TRULICITY 0.75 MG/0.5 ML PE: 0.75 | 28 days supply | Qty: 2 | Fill #2

## 2018-12-15 MED FILL — ATENOLOL 50 MG TABLET: 50 | 90 days supply | Qty: 90 | Fill #1

## 2018-12-15 MED FILL — LOSARTAN POTASSIUM 100 MG T: 100 | 30 days supply | Qty: 30 | Fill #0

## 2019-01-10 MED FILL — TRULICITY 0.75 MG/0.5 ML PE: 0.75 | 28 days supply | Qty: 2 | Fill #3

## 2019-01-11 MED FILL — LOSARTAN POTASSIUM 100 MG T: 100 | 30 days supply | Qty: 30 | Fill #1

## 2019-01-23 MED FILL — JARDIANCE 25 MG TABLET: 25 | 90 days supply | Qty: 90 | Fill #3

## 2019-01-25 MED FILL — ESTRADIOL 10 MCG TABS: 10 | 28 days supply | Qty: 8 | Fill #2

## 2019-02-01 MED FILL — ATORVASTATIN 20 MG TABLET: 20 | 90 days supply | Qty: 90 | Fill #0

## 2019-02-02 MED FILL — JARDIANCE 25 MG TABLET: 25 | 90 days supply | Qty: 90 | Fill #3

## 2019-02-11 DIAGNOSIS — Z01419 Encounter for gynecological examination (general) (routine) without abnormal findings: Secondary | ICD-10-CM | POA: Diagnosis not present

## 2019-02-11 DIAGNOSIS — Z6841 Body Mass Index (BMI) 40.0 and over, adult: Secondary | ICD-10-CM | POA: Diagnosis not present

## 2019-02-11 DIAGNOSIS — N952 Postmenopausal atrophic vaginitis: Secondary | ICD-10-CM | POA: Diagnosis not present

## 2019-02-11 MED FILL — TRULICITY 0.75 MG/0.5 ML PE: 0.75 | 28 days supply | Qty: 2 | Fill #4

## 2019-02-12 MED FILL — LOSARTAN POTASSIUM 100 MG T: 100 | 90 days supply | Qty: 90 | Fill #2

## 2019-02-15 MED FILL — ESTRADIOL 10 MCG TABS: 10 | 84 days supply | Qty: 34 | Fill #0

## 2019-02-22 DIAGNOSIS — Z1231 Encounter for screening mammogram for malignant neoplasm of breast: Secondary | ICD-10-CM | POA: Diagnosis not present

## 2019-02-24 MED FILL — OXYBUTYNIN CL ER 10 MG TAB: 10 | 90 days supply | Qty: 90 | Fill #0

## 2019-03-11 MED FILL — TRULICITY 0.75 MG/0.5 ML PE: 0.75 | 28 days supply | Qty: 2 | Fill #5

## 2019-03-20 MED FILL — ATENOLOL 50 MG TABLET: 50 | 90 days supply | Qty: 90 | Fill #2

## 2019-04-13 MED FILL — TRULICITY 0.75 MG/0.5 ML PE: 0.75 | 28 days supply | Qty: 2 | Fill #6

## 2019-04-27 MED FILL — JARDIANCE 25 MG TABLET: 25 | 90 days supply | Qty: 90 | Fill #0

## 2019-05-11 MED FILL — TRULICITY 0.75 MG/0.5 ML PE: 0.75 | 28 days supply | Qty: 2 | Fill #0

## 2019-05-13 MED FILL — LOSARTAN POTASSIUM 100 MG T: 100 | 90 days supply | Qty: 90 | Fill #3

## 2019-06-02 MED FILL — TRULICITY 0.75 MG/0.5 ML PE: 0.75 | 28 days supply | Qty: 2 | Fill #1

## 2019-06-02 MED FILL — OXYBUTYNIN CL ER 10 MG TAB: 10 | 90 days supply | Qty: 90 | Fill #0

## 2019-06-07 MED FILL — ATENOLOL 50 MG TABLET: 50 | 90 days supply | Qty: 90 | Fill #3

## 2019-06-08 DIAGNOSIS — Z79899 Other long term (current) drug therapy: Secondary | ICD-10-CM | POA: Diagnosis not present

## 2019-06-08 DIAGNOSIS — E119 Type 2 diabetes mellitus without complications: Secondary | ICD-10-CM | POA: Diagnosis not present

## 2019-06-08 DIAGNOSIS — I10 Essential (primary) hypertension: Secondary | ICD-10-CM | POA: Diagnosis not present

## 2019-06-15 ENCOUNTER — Other Ambulatory Visit (HOSPITAL_COMMUNITY): Payer: Self-pay | Admitting: Internal Medicine

## 2019-06-15 MED FILL — ROSUVASTATIN CALCIUM 5 MG T: 5 | 90 days supply | Qty: 39 | Fill #0

## 2019-07-29 MED FILL — JARDIANCE 25 MG TABLET: 25 | 90 days supply | Qty: 90 | Fill #1

## 2019-08-05 MED FILL — LOSARTAN POTASSIUM 100 MG T: 100 | 30 days supply | Qty: 30 | Fill #4

## 2019-08-18 DIAGNOSIS — R197 Diarrhea, unspecified: Secondary | ICD-10-CM | POA: Diagnosis not present

## 2019-08-18 DIAGNOSIS — K625 Hemorrhage of anus and rectum: Secondary | ICD-10-CM | POA: Diagnosis not present

## 2019-08-18 DIAGNOSIS — R109 Unspecified abdominal pain: Secondary | ICD-10-CM | POA: Diagnosis not present

## 2019-08-18 DIAGNOSIS — Z1159 Encounter for screening for other viral diseases: Secondary | ICD-10-CM | POA: Diagnosis not present

## 2019-08-24 DIAGNOSIS — K6389 Other specified diseases of intestine: Secondary | ICD-10-CM | POA: Diagnosis not present

## 2019-08-24 DIAGNOSIS — K5289 Other specified noninfective gastroenteritis and colitis: Secondary | ICD-10-CM | POA: Diagnosis not present

## 2019-08-24 DIAGNOSIS — R197 Diarrhea, unspecified: Secondary | ICD-10-CM | POA: Diagnosis not present

## 2019-08-24 DIAGNOSIS — K625 Hemorrhage of anus and rectum: Secondary | ICD-10-CM | POA: Diagnosis not present

## 2019-08-30 DIAGNOSIS — K5289 Other specified noninfective gastroenteritis and colitis: Secondary | ICD-10-CM | POA: Diagnosis not present

## 2019-09-02 ENCOUNTER — Other Ambulatory Visit (HOSPITAL_COMMUNITY): Payer: Self-pay | Admitting: Internal Medicine

## 2019-09-02 MED FILL — LOSARTAN POTASSIUM 100 MG T: 100 | 30 days supply | Qty: 30 | Fill #0

## 2019-09-02 MED FILL — OXYBUTYNIN CL ER 10 MG TAB: 10 | 90 days supply | Qty: 90 | Fill #0

## 2019-09-10 MED FILL — ROSUVASTATIN CALCIUM 5 MG T: 5 | 28 days supply | Qty: 12 | Fill #2

## 2019-09-20 ENCOUNTER — Other Ambulatory Visit (HOSPITAL_COMMUNITY): Payer: Self-pay | Admitting: Internal Medicine

## 2019-09-20 DIAGNOSIS — J029 Acute pharyngitis, unspecified: Secondary | ICD-10-CM | POA: Diagnosis not present

## 2019-09-20 DIAGNOSIS — J069 Acute upper respiratory infection, unspecified: Secondary | ICD-10-CM | POA: Diagnosis not present

## 2019-09-20 MED FILL — AMOXICILLIN 500 MG CAPSULE: 500 | 3 days supply | Qty: 12 | Fill #1

## 2019-09-20 MED FILL — ATENOLOL 50 MG TABLET: 50 | 90 days supply | Qty: 90 | Fill #0

## 2019-09-20 MED FILL — PROMETHAZINE W/DM SYRUP: 6.25-15 | 9 days supply | Qty: 180 | Fill #0

## 2019-09-20 MED FILL — IPRATROPIUM 0.06% SPRAY: 0.06 | 10 days supply | Qty: 15 | Fill #0

## 2019-09-20 MED FILL — MMW LIDO/NYST/DIPH 1:1:1: 9 days supply | Qty: 180 | Fill #0

## 2019-10-06 MED FILL — AMOXICILLIN 500 MG CAPSULE: 500 | 4 days supply | Qty: 16 | Fill #0

## 2019-10-06 MED FILL — LOSARTAN POTASSIUM 100 MG T: 100 | 30 days supply | Qty: 30 | Fill #1

## 2019-10-06 MED FILL — ROSUVASTATIN CALCIUM 5 MG T: 5 | 28 days supply | Qty: 12 | Fill #3

## 2019-11-02 MED FILL — JARDIANCE 25 MG TABLET: 25 | 90 days supply | Qty: 90 | Fill #2

## 2019-11-05 MED FILL — ROSUVASTATIN CALCIUM 5 MG T: 5 | 28 days supply | Qty: 12 | Fill #4

## 2019-11-05 MED FILL — LOSARTAN POTASSIUM 100 MG T: 100 | 30 days supply | Qty: 30 | Fill #2

## 2019-11-29 DIAGNOSIS — H43813 Vitreous degeneration, bilateral: Secondary | ICD-10-CM | POA: Diagnosis not present

## 2019-11-29 DIAGNOSIS — H26493 Other secondary cataract, bilateral: Secondary | ICD-10-CM | POA: Diagnosis not present

## 2019-11-29 DIAGNOSIS — Z961 Presence of intraocular lens: Secondary | ICD-10-CM | POA: Diagnosis not present

## 2019-11-29 DIAGNOSIS — H0102A Squamous blepharitis right eye, upper and lower eyelids: Secondary | ICD-10-CM | POA: Diagnosis not present

## 2019-11-29 DIAGNOSIS — E119 Type 2 diabetes mellitus without complications: Secondary | ICD-10-CM | POA: Diagnosis not present

## 2019-11-29 DIAGNOSIS — H0102B Squamous blepharitis left eye, upper and lower eyelids: Secondary | ICD-10-CM | POA: Diagnosis not present

## 2019-12-02 MED FILL — ROSUVASTATIN CALCIUM 5 MG T: 5 | 28 days supply | Qty: 12 | Fill #5

## 2019-12-03 MED FILL — OXYBUTYNIN CL ER 10 MG TAB: 10 | 90 days supply | Qty: 90 | Fill #0

## 2019-12-05 MED FILL — LOSARTAN POTASSIUM 100 MG T: 100 | 30 days supply | Qty: 30 | Fill #3

## 2019-12-14 DIAGNOSIS — I10 Essential (primary) hypertension: Secondary | ICD-10-CM | POA: Diagnosis not present

## 2019-12-14 DIAGNOSIS — E1165 Type 2 diabetes mellitus with hyperglycemia: Secondary | ICD-10-CM | POA: Diagnosis not present

## 2019-12-14 DIAGNOSIS — Z7984 Long term (current) use of oral hypoglycemic drugs: Secondary | ICD-10-CM | POA: Diagnosis not present

## 2019-12-16 MED FILL — ATENOLOL 50 MG TABLET: 50 | 90 days supply | Qty: 90 | Fill #1

## 2020-01-04 MED FILL — ROSUVASTATIN CALCIUM 5 MG T: 5 | 28 days supply | Qty: 12 | Fill #6

## 2020-01-06 MED FILL — LOSARTAN POTASSIUM 100 MG T: 100 | 30 days supply | Qty: 30 | Fill #4

## 2020-01-29 MED FILL — JARDIANCE 25 MG TABLET: 25 | 90 days supply | Qty: 90 | Fill #3

## 2020-01-29 MED FILL — ROSUVASTATIN CALCIUM 5 MG T: 5 | 28 days supply | Qty: 12 | Fill #7

## 2020-02-09 MED FILL — LOSARTAN POTASSIUM 100 MG T: 100 | 30 days supply | Qty: 30 | Fill #5

## 2020-02-27 MED FILL — ROSUVASTATIN CALCIUM 5 MG T: 5 | 28 days supply | Qty: 12 | Fill #8

## 2020-03-07 MED FILL — LOSARTAN POTASSIUM 100 MG T: 100 | 30 days supply | Qty: 30 | Fill #6

## 2020-03-10 ENCOUNTER — Other Ambulatory Visit (HOSPITAL_COMMUNITY): Payer: Self-pay | Admitting: Obstetrics and Gynecology

## 2020-03-10 MED FILL — OXYBUTYNIN CL ER 10 MG TAB: 10 | 90 days supply | Qty: 90 | Fill #0

## 2020-03-13 MED FILL — ATENOLOL 50 MG TABLET: 50 | 90 days supply | Qty: 90 | Fill #2

## 2020-03-15 DIAGNOSIS — Z7984 Long term (current) use of oral hypoglycemic drugs: Secondary | ICD-10-CM | POA: Diagnosis not present

## 2020-03-15 DIAGNOSIS — I10 Essential (primary) hypertension: Secondary | ICD-10-CM | POA: Diagnosis not present

## 2020-03-15 DIAGNOSIS — E1165 Type 2 diabetes mellitus with hyperglycemia: Secondary | ICD-10-CM | POA: Diagnosis not present

## 2020-03-22 ENCOUNTER — Ambulatory Visit: Payer: Self-pay

## 2020-03-22 ENCOUNTER — Encounter: Payer: Self-pay | Admitting: Podiatry

## 2020-03-22 ENCOUNTER — Other Ambulatory Visit: Payer: Self-pay

## 2020-03-22 ENCOUNTER — Ambulatory Visit (INDEPENDENT_AMBULATORY_CARE_PROVIDER_SITE_OTHER): Payer: PPO | Admitting: Podiatry

## 2020-03-22 DIAGNOSIS — M779 Enthesopathy, unspecified: Secondary | ICD-10-CM | POA: Diagnosis not present

## 2020-03-22 DIAGNOSIS — M7750 Other enthesopathy of unspecified foot: Secondary | ICD-10-CM

## 2020-03-22 DIAGNOSIS — M205X1 Other deformities of toe(s) (acquired), right foot: Secondary | ICD-10-CM

## 2020-03-22 MED ORDER — TRIAMCINOLONE ACETONIDE 10 MG/ML IJ SUSP
10.0000 mg | Freq: Once | INTRAMUSCULAR | Status: AC
  Administered 2020-03-22: 10 mg

## 2020-03-23 NOTE — Progress Notes (Signed)
Subjective:   Patient ID: Jessica Bowers, female   DOB: 69 y.o.   MRN: [id number]   HPI Patient presents stating that she has developed a lot of pain in her ankle and her big toe joint.  States that she does not understand what is happening this is been present for the last few months and gradually becoming more irritated for her.  States that she has tried to reduce activity and other modalities without relief of symptoms at the current time.  Patient does not smoke likes to be active   Review of Systems  All other systems reviewed and are negative.       Objective:  Physical Exam Vitals and nursing note reviewed.  Constitutional:      Appearance: She is well-developed and well-nourished.  Cardiovascular:     Pulses: Intact distal pulses.  Pulmonary:     Effort: Pulmonary effort is normal.  Musculoskeletal:        General: Normal range of motion.  Skin:    General: Skin is warm.  Neurological:     Mental Status: She is alert.     Neurovascular status was found to be intact muscle strength was found to be adequate range of motion adequate.  Patient is found to have quite a bit of inflammation within the subtalar joint right with fluid buildup of the joint and is noted to have inflammation pain around the first metatarsal phalangeal joint right with mild limitation of motion but no crepitus of the joint surface.  Patient is found to have good digital perfusion well oriented x3     Assessment:  Inflammatory capsulitis sinus tarsi right with fluid buildup along with inflammatory hallux limitus capsulitis condition first MPJ right     Plan:  H&P conditions reviewed and I discussed the inflammatory nature along with x-rays.  Today I went ahead did sterile prep and injected the sinus tarsi right 3 mg Kenalog 5 mg Xylocaine in the right first MPJ with 3 mg dexamethasone Kenalog 5 mg Xylocaine.  Discussed anti-inflammatories discussed reduced activity and reappoint as symptoms  indicate for condition  X-rays indicate that there is no signs of arthritis or stress fracture associated with condition

## 2020-03-24 ENCOUNTER — Telehealth: Payer: Self-pay | Admitting: Podiatry

## 2020-03-24 NOTE — Telephone Encounter (Signed)
Called patient and discussed likely steroid flare, advised benadryl, rest. Discussed if she experiences CP or SOB she is to present directly to the ED. Patient verbalized understanding.

## 2020-03-24 NOTE — Telephone Encounter (Signed)
Pt called stating she is currently experiencing a reaction from the two injections she received on 12/15. She states she's been having headaches, and what looks like a sunburn on her face. She also states her face is hot to the touch. Please advise.

## 2020-03-27 NOTE — Telephone Encounter (Signed)
thanks

## 2020-03-29 MED FILL — ROSUVASTATIN CALCIUM 5 MG T: 5 | 28 days supply | Qty: 12 | Fill #9

## 2020-04-05 MED FILL — LOSARTAN POTASSIUM 100 MG T: 100 | 30 days supply | Qty: 30 | Fill #7

## 2020-04-26 ENCOUNTER — Other Ambulatory Visit (HOSPITAL_COMMUNITY): Payer: Self-pay | Admitting: Internal Medicine

## 2020-04-26 MED FILL — ROSUVASTATIN CALCIUM 5 MG T: 5 | 28 days supply | Qty: 12 | Fill #10

## 2020-04-26 MED FILL — JARDIANCE 25 MG TABLET: 25 | 90 days supply | Qty: 90 | Fill #0

## 2020-05-02 MED FILL — LOSARTAN POTASSIUM 100 MG T: 100 | 30 days supply | Qty: 30 | Fill #8

## 2020-05-26 MED FILL — ROSUVASTATIN CALCIUM 5 MG T: 5 | 28 days supply | Qty: 12 | Fill #11

## 2020-06-10 MED FILL — LOSARTAN POTASSIUM 100 MG T: 100 | 30 days supply | Qty: 30 | Fill #9

## 2020-06-17 MED FILL — ATENOLOL 50 MG TABLET: 50 | 90 days supply | Qty: 90 | Fill #3

## 2020-06-19 ENCOUNTER — Other Ambulatory Visit (HOSPITAL_COMMUNITY): Payer: Self-pay | Admitting: Internal Medicine

## 2020-06-30 ENCOUNTER — Other Ambulatory Visit (HOSPITAL_BASED_OUTPATIENT_CLINIC_OR_DEPARTMENT_OTHER): Payer: Self-pay

## 2020-07-04 ENCOUNTER — Other Ambulatory Visit (HOSPITAL_BASED_OUTPATIENT_CLINIC_OR_DEPARTMENT_OTHER): Payer: Self-pay

## 2020-07-07 MED FILL — LOSARTAN POTASSIUM 100 MG T: 100 | 60 days supply | Qty: 60 | Fill #10

## 2020-07-17 DIAGNOSIS — H8110 Benign paroxysmal vertigo, unspecified ear: Secondary | ICD-10-CM | POA: Diagnosis not present

## 2020-07-17 DIAGNOSIS — Z7984 Long term (current) use of oral hypoglycemic drugs: Secondary | ICD-10-CM | POA: Diagnosis not present

## 2020-07-17 DIAGNOSIS — E1165 Type 2 diabetes mellitus with hyperglycemia: Secondary | ICD-10-CM | POA: Diagnosis not present

## 2020-07-17 DIAGNOSIS — I10 Essential (primary) hypertension: Secondary | ICD-10-CM | POA: Diagnosis not present

## 2020-07-25 DIAGNOSIS — R42 Dizziness and giddiness: Secondary | ICD-10-CM | POA: Diagnosis not present

## 2020-08-03 ENCOUNTER — Other Ambulatory Visit (HOSPITAL_COMMUNITY): Payer: Self-pay

## 2020-08-03 MED FILL — Empagliflozin Tab 25 MG: ORAL | 90 days supply | Qty: 90 | Fill #0 | Status: AC

## 2020-08-12 ENCOUNTER — Other Ambulatory Visit (HOSPITAL_COMMUNITY): Payer: Self-pay

## 2020-08-12 ENCOUNTER — Other Ambulatory Visit (HOSPITAL_COMMUNITY): Payer: Self-pay | Admitting: Obstetrics and Gynecology

## 2020-08-14 ENCOUNTER — Other Ambulatory Visit (HOSPITAL_COMMUNITY): Payer: Self-pay

## 2020-08-16 ENCOUNTER — Other Ambulatory Visit (HOSPITAL_COMMUNITY): Payer: Self-pay | Admitting: Obstetrics and Gynecology

## 2020-08-16 ENCOUNTER — Other Ambulatory Visit (HOSPITAL_COMMUNITY): Payer: Self-pay

## 2020-08-16 MED FILL — Oxybutynin Chloride Tab ER 24HR 10 MG: ORAL | 5 days supply | Qty: 5 | Fill #0 | Status: AC

## 2020-08-17 ENCOUNTER — Other Ambulatory Visit (HOSPITAL_COMMUNITY): Payer: Self-pay

## 2020-08-22 ENCOUNTER — Other Ambulatory Visit (HOSPITAL_COMMUNITY): Payer: Self-pay

## 2020-08-22 MED ORDER — OXYBUTYNIN CHLORIDE ER 10 MG PO TB24
10.0000 mg | ORAL_TABLET | Freq: Every day | ORAL | 2 refills | Status: DC
  Filled 2020-08-22: qty 30, 30d supply, fill #0
  Filled 2020-09-20: qty 30, 30d supply, fill #1
  Filled 2020-10-19: qty 30, 30d supply, fill #2

## 2020-08-29 ENCOUNTER — Other Ambulatory Visit (HOSPITAL_COMMUNITY): Payer: Self-pay | Admitting: Internal Medicine

## 2020-08-30 ENCOUNTER — Other Ambulatory Visit (HOSPITAL_COMMUNITY): Payer: Self-pay

## 2020-08-30 MED ORDER — LOSARTAN POTASSIUM 100 MG PO TABS
1.0000 | ORAL_TABLET | Freq: Every day | ORAL | 11 refills | Status: DC
  Filled 2020-08-30: qty 30, 30d supply, fill #0
  Filled 2020-09-27: qty 30, 30d supply, fill #1
  Filled 2020-11-01: qty 30, 30d supply, fill #2
  Filled 2020-11-29: qty 30, 30d supply, fill #3
  Filled 2021-01-03: qty 30, 30d supply, fill #4
  Filled 2021-02-01: qty 30, 30d supply, fill #5
  Filled 2021-03-03: qty 30, 30d supply, fill #6
  Filled 2021-03-30: qty 30, 30d supply, fill #7
  Filled 2021-04-29: qty 90, 90d supply, fill #8
  Filled 2021-07-27: qty 30, 30d supply, fill #9

## 2020-09-13 ENCOUNTER — Other Ambulatory Visit (HOSPITAL_COMMUNITY): Payer: Self-pay

## 2020-09-13 MED ORDER — ATENOLOL 50 MG PO TABS
ORAL_TABLET | ORAL | 4 refills | Status: AC
  Filled 2020-09-13: qty 90, 90d supply, fill #0

## 2020-09-20 ENCOUNTER — Other Ambulatory Visit (HOSPITAL_COMMUNITY): Payer: Self-pay

## 2020-09-20 MED FILL — Rosuvastatin Calcium Tab 5 MG: ORAL | 90 days supply | Qty: 39 | Fill #0 | Status: AC

## 2020-09-27 ENCOUNTER — Other Ambulatory Visit (HOSPITAL_COMMUNITY): Payer: Self-pay

## 2020-10-05 ENCOUNTER — Other Ambulatory Visit: Payer: No Typology Code available for payment source

## 2020-10-19 ENCOUNTER — Other Ambulatory Visit (HOSPITAL_COMMUNITY): Payer: Self-pay

## 2020-11-01 ENCOUNTER — Other Ambulatory Visit (HOSPITAL_COMMUNITY): Payer: Self-pay

## 2020-11-01 MED FILL — Empagliflozin Tab 25 MG: ORAL | 30 days supply | Qty: 30 | Fill #1 | Status: AC

## 2020-11-02 ENCOUNTER — Other Ambulatory Visit (HOSPITAL_COMMUNITY): Payer: Self-pay

## 2020-11-16 ENCOUNTER — Other Ambulatory Visit (HOSPITAL_COMMUNITY): Payer: Self-pay

## 2020-11-16 DIAGNOSIS — Z01419 Encounter for gynecological examination (general) (routine) without abnormal findings: Secondary | ICD-10-CM | POA: Diagnosis not present

## 2020-11-16 DIAGNOSIS — Z124 Encounter for screening for malignant neoplasm of cervix: Secondary | ICD-10-CM | POA: Diagnosis not present

## 2020-11-16 DIAGNOSIS — N3281 Overactive bladder: Secondary | ICD-10-CM | POA: Diagnosis not present

## 2020-11-16 DIAGNOSIS — Z1231 Encounter for screening mammogram for malignant neoplasm of breast: Secondary | ICD-10-CM | POA: Diagnosis not present

## 2020-11-16 DIAGNOSIS — Z6836 Body mass index (BMI) 36.0-36.9, adult: Secondary | ICD-10-CM | POA: Diagnosis not present

## 2020-11-16 MED ORDER — OXYBUTYNIN CHLORIDE ER 10 MG PO TB24
10.0000 mg | ORAL_TABLET | Freq: Every day | ORAL | 4 refills | Status: DC
  Filled 2020-11-16: qty 90, 90d supply, fill #0
  Filled 2021-02-23: qty 90, 90d supply, fill #1
  Filled 2021-06-03: qty 90, 90d supply, fill #2
  Filled 2021-09-07: qty 90, 90d supply, fill #3

## 2020-11-29 DIAGNOSIS — H0102A Squamous blepharitis right eye, upper and lower eyelids: Secondary | ICD-10-CM | POA: Diagnosis not present

## 2020-11-29 DIAGNOSIS — Z961 Presence of intraocular lens: Secondary | ICD-10-CM | POA: Diagnosis not present

## 2020-11-29 DIAGNOSIS — H43813 Vitreous degeneration, bilateral: Secondary | ICD-10-CM | POA: Diagnosis not present

## 2020-11-29 DIAGNOSIS — H26493 Other secondary cataract, bilateral: Secondary | ICD-10-CM | POA: Diagnosis not present

## 2020-11-29 DIAGNOSIS — E119 Type 2 diabetes mellitus without complications: Secondary | ICD-10-CM | POA: Diagnosis not present

## 2020-11-29 DIAGNOSIS — H0102B Squamous blepharitis left eye, upper and lower eyelids: Secondary | ICD-10-CM | POA: Diagnosis not present

## 2020-11-29 MED FILL — Empagliflozin Tab 25 MG: ORAL | 30 days supply | Qty: 30 | Fill #2 | Status: AC

## 2020-11-30 ENCOUNTER — Other Ambulatory Visit (HOSPITAL_COMMUNITY): Payer: Self-pay

## 2020-12-20 ENCOUNTER — Other Ambulatory Visit (HOSPITAL_COMMUNITY): Payer: Self-pay

## 2020-12-20 MED FILL — Rosuvastatin Calcium Tab 5 MG: ORAL | 90 days supply | Qty: 39 | Fill #1 | Status: AC

## 2020-12-23 ENCOUNTER — Other Ambulatory Visit (HOSPITAL_COMMUNITY): Payer: Self-pay

## 2021-01-03 MED FILL — Empagliflozin Tab 25 MG: ORAL | 30 days supply | Qty: 30 | Fill #3 | Status: AC

## 2021-01-04 ENCOUNTER — Other Ambulatory Visit (HOSPITAL_COMMUNITY): Payer: Self-pay

## 2021-01-15 DIAGNOSIS — E1165 Type 2 diabetes mellitus with hyperglycemia: Secondary | ICD-10-CM | POA: Diagnosis not present

## 2021-01-15 DIAGNOSIS — Z7984 Long term (current) use of oral hypoglycemic drugs: Secondary | ICD-10-CM | POA: Diagnosis not present

## 2021-01-15 DIAGNOSIS — I10 Essential (primary) hypertension: Secondary | ICD-10-CM | POA: Diagnosis not present

## 2021-01-16 ENCOUNTER — Other Ambulatory Visit (HOSPITAL_COMMUNITY): Payer: Self-pay

## 2021-01-16 MED ORDER — AMOXICILLIN 500 MG PO CAPS
2000.0000 mg | ORAL_CAPSULE | ORAL | 0 refills | Status: AC
  Filled 2021-01-16: qty 8, 2d supply, fill #0

## 2021-02-01 MED FILL — Empagliflozin Tab 25 MG: ORAL | 30 days supply | Qty: 30 | Fill #4 | Status: AC

## 2021-02-02 ENCOUNTER — Other Ambulatory Visit (HOSPITAL_COMMUNITY): Payer: Self-pay

## 2021-02-23 ENCOUNTER — Other Ambulatory Visit (HOSPITAL_COMMUNITY): Payer: Self-pay

## 2021-03-03 ENCOUNTER — Other Ambulatory Visit (HOSPITAL_COMMUNITY): Payer: Self-pay

## 2021-03-09 MED FILL — Empagliflozin Tab 25 MG: ORAL | 30 days supply | Qty: 30 | Fill #5 | Status: AC

## 2021-03-10 ENCOUNTER — Other Ambulatory Visit (HOSPITAL_COMMUNITY): Payer: Self-pay

## 2021-03-19 ENCOUNTER — Other Ambulatory Visit (HOSPITAL_COMMUNITY): Payer: Self-pay

## 2021-03-19 MED FILL — Rosuvastatin Calcium Tab 5 MG: ORAL | 90 days supply | Qty: 39 | Fill #2 | Status: AC

## 2021-03-28 ENCOUNTER — Other Ambulatory Visit (HOSPITAL_COMMUNITY): Payer: Self-pay

## 2021-03-28 MED ORDER — ATENOLOL 50 MG PO TABS
50.0000 mg | ORAL_TABLET | Freq: Every day | ORAL | 2 refills | Status: DC
  Filled 2021-03-28: qty 90, 90d supply, fill #0
  Filled 2021-06-25: qty 90, 90d supply, fill #1

## 2021-04-01 ENCOUNTER — Other Ambulatory Visit (HOSPITAL_COMMUNITY): Payer: Self-pay

## 2021-04-03 ENCOUNTER — Other Ambulatory Visit (HOSPITAL_COMMUNITY): Payer: Self-pay

## 2021-04-09 ENCOUNTER — Other Ambulatory Visit (HOSPITAL_COMMUNITY): Payer: Self-pay

## 2021-04-09 MED FILL — Empagliflozin Tab 25 MG: ORAL | 30 days supply | Qty: 30 | Fill #6 | Status: CN

## 2021-04-10 ENCOUNTER — Other Ambulatory Visit (HOSPITAL_COMMUNITY): Payer: Self-pay

## 2021-04-10 MED ORDER — JARDIANCE 25 MG PO TABS
25.0000 mg | ORAL_TABLET | Freq: Every day | ORAL | 3 refills | Status: DC
  Filled 2021-04-10: qty 90, 90d supply, fill #0
  Filled 2021-07-06: qty 90, 90d supply, fill #1
  Filled 2021-10-03: qty 30, 30d supply, fill #2
  Filled 2021-11-07: qty 30, 30d supply, fill #3
  Filled 2021-12-08: qty 30, 30d supply, fill #4
  Filled 2022-01-07: qty 30, 30d supply, fill #5
  Filled 2022-01-24 – 2022-02-07 (×2): qty 30, 30d supply, fill #6
  Filled 2022-03-14: qty 30, 30d supply, fill #7

## 2021-04-13 ENCOUNTER — Other Ambulatory Visit (HOSPITAL_COMMUNITY): Payer: Self-pay

## 2021-04-18 ENCOUNTER — Other Ambulatory Visit (HOSPITAL_COMMUNITY): Payer: Self-pay

## 2021-04-24 ENCOUNTER — Other Ambulatory Visit (HOSPITAL_COMMUNITY): Payer: Self-pay

## 2021-04-24 MED ORDER — ESTRADIOL 10 MCG VA TABS
ORAL_TABLET | VAGINAL | 4 refills | Status: AC
  Filled 2021-04-24: qty 8, 28d supply, fill #0

## 2021-04-25 ENCOUNTER — Other Ambulatory Visit (HOSPITAL_COMMUNITY): Payer: Self-pay

## 2021-04-30 ENCOUNTER — Other Ambulatory Visit (HOSPITAL_COMMUNITY): Payer: Self-pay

## 2021-06-04 ENCOUNTER — Other Ambulatory Visit (HOSPITAL_COMMUNITY): Payer: Self-pay

## 2021-06-18 ENCOUNTER — Other Ambulatory Visit (HOSPITAL_COMMUNITY): Payer: Self-pay

## 2021-06-19 ENCOUNTER — Other Ambulatory Visit (HOSPITAL_COMMUNITY): Payer: Self-pay

## 2021-06-19 DIAGNOSIS — U071 COVID-19: Secondary | ICD-10-CM | POA: Diagnosis not present

## 2021-06-19 MED ORDER — PAXLOVID (300/100) 20 X 150 MG & 10 X 100MG PO TBPK
ORAL_TABLET | ORAL | 0 refills | Status: AC
  Filled 2021-06-19 (×4): qty 30, 5d supply, fill #0

## 2021-06-19 MED FILL — Rosuvastatin Calcium Tab 5 MG: ORAL | 84 days supply | Qty: 39 | Fill #0 | Status: CN

## 2021-06-20 ENCOUNTER — Other Ambulatory Visit (HOSPITAL_COMMUNITY): Payer: Self-pay

## 2021-06-20 MED ORDER — ROSUVASTATIN CALCIUM 5 MG PO TABS
ORAL_TABLET | ORAL | 3 refills | Status: AC
  Filled 2021-06-20: qty 39, 90d supply, fill #0

## 2021-06-20 MED ORDER — ROSUVASTATIN CALCIUM 5 MG PO TABS
5.0000 mg | ORAL_TABLET | ORAL | 3 refills | Status: DC
  Filled 2021-06-20: qty 39, 90d supply, fill #0
  Filled 2021-09-16: qty 39, 90d supply, fill #1
  Filled 2021-12-08: qty 39, 90d supply, fill #2
  Filled 2022-03-20: qty 39, 90d supply, fill #3

## 2021-06-25 ENCOUNTER — Other Ambulatory Visit (HOSPITAL_COMMUNITY): Payer: Self-pay

## 2021-07-04 ENCOUNTER — Other Ambulatory Visit (HOSPITAL_COMMUNITY): Payer: Self-pay

## 2021-07-04 MED ORDER — AMOXICILLIN 500 MG PO CAPS
ORAL_CAPSULE | ORAL | 0 refills | Status: AC
  Filled 2021-07-04: qty 8, 1d supply, fill #0

## 2021-07-06 ENCOUNTER — Other Ambulatory Visit (HOSPITAL_COMMUNITY): Payer: Self-pay

## 2021-07-06 DIAGNOSIS — Z7985 Long-term (current) use of injectable non-insulin antidiabetic drugs: Secondary | ICD-10-CM | POA: Diagnosis not present

## 2021-07-06 DIAGNOSIS — N952 Postmenopausal atrophic vaginitis: Secondary | ICD-10-CM | POA: Diagnosis not present

## 2021-07-06 DIAGNOSIS — Z6835 Body mass index (BMI) 35.0-35.9, adult: Secondary | ICD-10-CM | POA: Diagnosis not present

## 2021-07-06 DIAGNOSIS — Z7984 Long term (current) use of oral hypoglycemic drugs: Secondary | ICD-10-CM | POA: Diagnosis not present

## 2021-07-06 DIAGNOSIS — E119 Type 2 diabetes mellitus without complications: Secondary | ICD-10-CM | POA: Diagnosis not present

## 2021-07-06 DIAGNOSIS — E782 Mixed hyperlipidemia: Secondary | ICD-10-CM | POA: Diagnosis not present

## 2021-07-06 DIAGNOSIS — I10 Essential (primary) hypertension: Secondary | ICD-10-CM | POA: Diagnosis not present

## 2021-07-06 DIAGNOSIS — Z0000 Encounter for general adult medical examination without abnormal findings: Secondary | ICD-10-CM | POA: Diagnosis not present

## 2021-07-06 DIAGNOSIS — K2100 Gastro-esophageal reflux disease with esophagitis, without bleeding: Secondary | ICD-10-CM | POA: Diagnosis not present

## 2021-07-06 MED ORDER — ESTRADIOL 10 MCG VA TABS
ORAL_TABLET | VAGINAL | 6 refills | Status: AC
  Filled 2021-07-06 – 2021-09-28 (×2): qty 8, 28d supply, fill #0

## 2021-07-07 ENCOUNTER — Other Ambulatory Visit (HOSPITAL_COMMUNITY): Payer: Self-pay

## 2021-07-12 DIAGNOSIS — L821 Other seborrheic keratosis: Secondary | ICD-10-CM | POA: Diagnosis not present

## 2021-07-12 DIAGNOSIS — L820 Inflamed seborrheic keratosis: Secondary | ICD-10-CM | POA: Diagnosis not present

## 2021-07-12 DIAGNOSIS — L720 Epidermal cyst: Secondary | ICD-10-CM | POA: Diagnosis not present

## 2021-07-17 DIAGNOSIS — I10 Essential (primary) hypertension: Secondary | ICD-10-CM | POA: Diagnosis not present

## 2021-07-17 DIAGNOSIS — E1165 Type 2 diabetes mellitus with hyperglycemia: Secondary | ICD-10-CM | POA: Diagnosis not present

## 2021-07-17 DIAGNOSIS — Z6835 Body mass index (BMI) 35.0-35.9, adult: Secondary | ICD-10-CM | POA: Diagnosis not present

## 2021-07-27 ENCOUNTER — Other Ambulatory Visit (HOSPITAL_COMMUNITY): Payer: Self-pay

## 2021-08-09 DIAGNOSIS — H6121 Impacted cerumen, right ear: Secondary | ICD-10-CM | POA: Diagnosis not present

## 2021-08-29 ENCOUNTER — Other Ambulatory Visit (HOSPITAL_COMMUNITY): Payer: Self-pay

## 2021-08-29 MED ORDER — LOSARTAN POTASSIUM 100 MG PO TABS
100.0000 mg | ORAL_TABLET | Freq: Every day | ORAL | 11 refills | Status: DC
  Filled 2021-08-29: qty 30, 30d supply, fill #0
  Filled 2021-10-01: qty 30, 30d supply, fill #1
  Filled 2021-10-26: qty 30, 30d supply, fill #2
  Filled 2021-11-23: qty 30, 30d supply, fill #3
  Filled 2021-12-31: qty 30, 30d supply, fill #4
  Filled 2022-01-24: qty 30, 30d supply, fill #5
  Filled 2022-02-26: qty 30, 30d supply, fill #6
  Filled 2022-03-26: qty 30, 30d supply, fill #7
  Filled 2022-05-01: qty 100, 100d supply, fill #8
  Filled 2022-05-01: qty 30, 30d supply, fill #8
  Filled 2022-08-11: qty 100, 100d supply, fill #9

## 2021-09-08 ENCOUNTER — Other Ambulatory Visit (HOSPITAL_COMMUNITY): Payer: Self-pay

## 2021-09-17 ENCOUNTER — Other Ambulatory Visit (HOSPITAL_COMMUNITY): Payer: Self-pay

## 2021-09-18 ENCOUNTER — Other Ambulatory Visit (HOSPITAL_COMMUNITY): Payer: Self-pay

## 2021-09-18 DIAGNOSIS — E785 Hyperlipidemia, unspecified: Secondary | ICD-10-CM | POA: Diagnosis not present

## 2021-09-18 DIAGNOSIS — E1165 Type 2 diabetes mellitus with hyperglycemia: Secondary | ICD-10-CM | POA: Diagnosis not present

## 2021-09-18 DIAGNOSIS — M1990 Unspecified osteoarthritis, unspecified site: Secondary | ICD-10-CM | POA: Diagnosis not present

## 2021-09-18 DIAGNOSIS — K219 Gastro-esophageal reflux disease without esophagitis: Secondary | ICD-10-CM | POA: Diagnosis not present

## 2021-09-18 DIAGNOSIS — I10 Essential (primary) hypertension: Secondary | ICD-10-CM | POA: Diagnosis not present

## 2021-09-18 DIAGNOSIS — G4700 Insomnia, unspecified: Secondary | ICD-10-CM | POA: Diagnosis not present

## 2021-09-18 DIAGNOSIS — Z7982 Long term (current) use of aspirin: Secondary | ICD-10-CM | POA: Diagnosis not present

## 2021-09-18 DIAGNOSIS — N3281 Overactive bladder: Secondary | ICD-10-CM | POA: Diagnosis not present

## 2021-09-18 DIAGNOSIS — E1169 Type 2 diabetes mellitus with other specified complication: Secondary | ICD-10-CM | POA: Diagnosis not present

## 2021-09-18 DIAGNOSIS — G8929 Other chronic pain: Secondary | ICD-10-CM | POA: Diagnosis not present

## 2021-09-18 DIAGNOSIS — Z9849 Cataract extraction status, unspecified eye: Secondary | ICD-10-CM | POA: Diagnosis not present

## 2021-09-19 ENCOUNTER — Other Ambulatory Visit (HOSPITAL_COMMUNITY): Payer: Self-pay

## 2021-09-19 DIAGNOSIS — M19071 Primary osteoarthritis, right ankle and foot: Secondary | ICD-10-CM | POA: Diagnosis not present

## 2021-09-19 DIAGNOSIS — M25571 Pain in right ankle and joints of right foot: Secondary | ICD-10-CM | POA: Diagnosis not present

## 2021-09-19 MED ORDER — ATENOLOL 50 MG PO TABS
ORAL_TABLET | ORAL | 3 refills | Status: DC
Start: 2021-09-19 — End: 2022-10-21
  Filled 2021-09-19: qty 90, 90d supply, fill #0
  Filled 2022-01-07: qty 90, 90d supply, fill #1
  Filled 2022-04-09: qty 90, 90d supply, fill #2
  Filled 2022-07-10: qty 90, 90d supply, fill #3

## 2021-09-19 MED ORDER — MELOXICAM 7.5 MG PO TABS
ORAL_TABLET | ORAL | 0 refills | Status: AC
Start: 2021-09-19 — End: ?
  Filled 2021-09-19: qty 60, 30d supply, fill #0

## 2021-09-28 ENCOUNTER — Other Ambulatory Visit (HOSPITAL_COMMUNITY): Payer: Self-pay

## 2021-10-01 ENCOUNTER — Other Ambulatory Visit (HOSPITAL_COMMUNITY): Payer: Self-pay

## 2021-10-01 DIAGNOSIS — M25571 Pain in right ankle and joints of right foot: Secondary | ICD-10-CM | POA: Diagnosis not present

## 2021-10-04 ENCOUNTER — Other Ambulatory Visit (HOSPITAL_COMMUNITY): Payer: Self-pay

## 2021-10-27 ENCOUNTER — Other Ambulatory Visit (HOSPITAL_COMMUNITY): Payer: Self-pay

## 2021-11-05 DIAGNOSIS — M6701 Short Achilles tendon (acquired), right ankle: Secondary | ICD-10-CM | POA: Diagnosis not present

## 2021-11-05 DIAGNOSIS — M19071 Primary osteoarthritis, right ankle and foot: Secondary | ICD-10-CM | POA: Diagnosis not present

## 2021-11-05 DIAGNOSIS — M65861 Other synovitis and tenosynovitis, right lower leg: Secondary | ICD-10-CM | POA: Diagnosis not present

## 2021-11-07 ENCOUNTER — Other Ambulatory Visit (HOSPITAL_COMMUNITY): Payer: Self-pay

## 2021-11-19 DIAGNOSIS — M65861 Other synovitis and tenosynovitis, right lower leg: Secondary | ICD-10-CM | POA: Diagnosis not present

## 2021-11-19 DIAGNOSIS — M25571 Pain in right ankle and joints of right foot: Secondary | ICD-10-CM | POA: Diagnosis not present

## 2021-11-23 ENCOUNTER — Other Ambulatory Visit (HOSPITAL_COMMUNITY): Payer: Self-pay

## 2021-11-27 DIAGNOSIS — M25571 Pain in right ankle and joints of right foot: Secondary | ICD-10-CM | POA: Diagnosis not present

## 2021-11-28 DIAGNOSIS — M25562 Pain in left knee: Secondary | ICD-10-CM | POA: Diagnosis not present

## 2021-11-30 DIAGNOSIS — H26493 Other secondary cataract, bilateral: Secondary | ICD-10-CM | POA: Diagnosis not present

## 2021-11-30 DIAGNOSIS — H0102B Squamous blepharitis left eye, upper and lower eyelids: Secondary | ICD-10-CM | POA: Diagnosis not present

## 2021-11-30 DIAGNOSIS — H43813 Vitreous degeneration, bilateral: Secondary | ICD-10-CM | POA: Diagnosis not present

## 2021-11-30 DIAGNOSIS — H0102A Squamous blepharitis right eye, upper and lower eyelids: Secondary | ICD-10-CM | POA: Diagnosis not present

## 2021-11-30 DIAGNOSIS — Z961 Presence of intraocular lens: Secondary | ICD-10-CM | POA: Diagnosis not present

## 2021-11-30 DIAGNOSIS — M25571 Pain in right ankle and joints of right foot: Secondary | ICD-10-CM | POA: Diagnosis not present

## 2021-11-30 DIAGNOSIS — E119 Type 2 diabetes mellitus without complications: Secondary | ICD-10-CM | POA: Diagnosis not present

## 2021-12-04 DIAGNOSIS — M25571 Pain in right ankle and joints of right foot: Secondary | ICD-10-CM | POA: Diagnosis not present

## 2021-12-04 DIAGNOSIS — M65861 Other synovitis and tenosynovitis, right lower leg: Secondary | ICD-10-CM | POA: Diagnosis not present

## 2021-12-06 DIAGNOSIS — M25571 Pain in right ankle and joints of right foot: Secondary | ICD-10-CM | POA: Diagnosis not present

## 2021-12-08 ENCOUNTER — Other Ambulatory Visit (HOSPITAL_COMMUNITY): Payer: Self-pay

## 2021-12-11 ENCOUNTER — Other Ambulatory Visit (HOSPITAL_COMMUNITY): Payer: Self-pay

## 2021-12-11 DIAGNOSIS — M25571 Pain in right ankle and joints of right foot: Secondary | ICD-10-CM | POA: Diagnosis not present

## 2021-12-11 MED ORDER — OXYBUTYNIN CHLORIDE ER 10 MG PO TB24
10.0000 mg | ORAL_TABLET | Freq: Every day | ORAL | 0 refills | Status: DC
  Filled 2021-12-11: qty 90, 90d supply, fill #0

## 2021-12-13 DIAGNOSIS — M65861 Other synovitis and tenosynovitis, right lower leg: Secondary | ICD-10-CM | POA: Diagnosis not present

## 2021-12-13 DIAGNOSIS — M25571 Pain in right ankle and joints of right foot: Secondary | ICD-10-CM | POA: Diagnosis not present

## 2021-12-18 DIAGNOSIS — M25571 Pain in right ankle and joints of right foot: Secondary | ICD-10-CM | POA: Diagnosis not present

## 2021-12-20 DIAGNOSIS — M25571 Pain in right ankle and joints of right foot: Secondary | ICD-10-CM | POA: Diagnosis not present

## 2021-12-20 DIAGNOSIS — M65861 Other synovitis and tenosynovitis, right lower leg: Secondary | ICD-10-CM | POA: Diagnosis not present

## 2021-12-31 ENCOUNTER — Other Ambulatory Visit (HOSPITAL_COMMUNITY): Payer: Self-pay

## 2022-01-01 DIAGNOSIS — M25571 Pain in right ankle and joints of right foot: Secondary | ICD-10-CM | POA: Diagnosis not present

## 2022-01-01 DIAGNOSIS — M65861 Other synovitis and tenosynovitis, right lower leg: Secondary | ICD-10-CM | POA: Diagnosis not present

## 2022-01-07 ENCOUNTER — Other Ambulatory Visit (HOSPITAL_COMMUNITY): Payer: Self-pay

## 2022-01-08 DIAGNOSIS — M65861 Other synovitis and tenosynovitis, right lower leg: Secondary | ICD-10-CM | POA: Diagnosis not present

## 2022-01-08 DIAGNOSIS — M25571 Pain in right ankle and joints of right foot: Secondary | ICD-10-CM | POA: Diagnosis not present

## 2022-01-10 DIAGNOSIS — M25571 Pain in right ankle and joints of right foot: Secondary | ICD-10-CM | POA: Diagnosis not present

## 2022-01-15 DIAGNOSIS — M25571 Pain in right ankle and joints of right foot: Secondary | ICD-10-CM | POA: Diagnosis not present

## 2022-01-17 DIAGNOSIS — M25571 Pain in right ankle and joints of right foot: Secondary | ICD-10-CM | POA: Diagnosis not present

## 2022-01-17 DIAGNOSIS — E1165 Type 2 diabetes mellitus with hyperglycemia: Secondary | ICD-10-CM | POA: Diagnosis not present

## 2022-01-17 DIAGNOSIS — I10 Essential (primary) hypertension: Secondary | ICD-10-CM | POA: Diagnosis not present

## 2022-01-24 ENCOUNTER — Other Ambulatory Visit (HOSPITAL_COMMUNITY): Payer: Self-pay

## 2022-01-24 ENCOUNTER — Ambulatory Visit: Payer: PPO | Admitting: Podiatry

## 2022-01-24 DIAGNOSIS — M65861 Other synovitis and tenosynovitis, right lower leg: Secondary | ICD-10-CM | POA: Diagnosis not present

## 2022-01-24 DIAGNOSIS — L02619 Cutaneous abscess of unspecified foot: Secondary | ICD-10-CM | POA: Diagnosis not present

## 2022-01-24 DIAGNOSIS — M25571 Pain in right ankle and joints of right foot: Secondary | ICD-10-CM | POA: Diagnosis not present

## 2022-01-24 DIAGNOSIS — L03119 Cellulitis of unspecified part of limb: Secondary | ICD-10-CM | POA: Diagnosis not present

## 2022-01-24 MED ORDER — DOXYCYCLINE HYCLATE 100 MG PO TABS
100.0000 mg | ORAL_TABLET | Freq: Two times a day (BID) | ORAL | 1 refills | Status: AC
  Filled 2022-01-24: qty 16, 8d supply, fill #0

## 2022-01-27 NOTE — Progress Notes (Signed)
Subjective:   Patient ID: Jessica Bowers, female   DOB: 71 y.o.   MRN: [id number]   HPI Patient presents with distal irritation of the big toes of both feet after wearing a tight pair of boots without socks.  She is concerned about the possibility for infection and presents with diabetes   ROS      Objective:  Physical Exam  Neurovascular status was found to be intact muscle strength adequate with patient noted to have distal blistering of the hallux bilateral with slight redness noted more proximal and no systemic indications of infection     Assessment:  Blistering secondary to abnormal shoe gear with pressure with possibility for low-grade localized infection with diabetes     Plan:  H&P and reviewed condition at great length.  We did discuss what to do if any further redness or other pathology were to occur but at this point we are going to treat this conservatively and I went ahead today and I placed on doxycycline she will begin soaks and wide shoe gear.  Reappoint if issues were to occur

## 2022-02-04 DIAGNOSIS — M6701 Short Achilles tendon (acquired), right ankle: Secondary | ICD-10-CM | POA: Diagnosis not present

## 2022-02-04 DIAGNOSIS — M65861 Other synovitis and tenosynovitis, right lower leg: Secondary | ICD-10-CM | POA: Diagnosis not present

## 2022-02-07 ENCOUNTER — Other Ambulatory Visit (HOSPITAL_COMMUNITY): Payer: Self-pay

## 2022-02-18 ENCOUNTER — Other Ambulatory Visit (HOSPITAL_COMMUNITY): Payer: Self-pay

## 2022-02-18 DIAGNOSIS — N952 Postmenopausal atrophic vaginitis: Secondary | ICD-10-CM | POA: Diagnosis not present

## 2022-02-18 DIAGNOSIS — R051 Acute cough: Secondary | ICD-10-CM | POA: Diagnosis not present

## 2022-02-18 DIAGNOSIS — Z6835 Body mass index (BMI) 35.0-35.9, adult: Secondary | ICD-10-CM | POA: Diagnosis not present

## 2022-02-18 DIAGNOSIS — J029 Acute pharyngitis, unspecified: Secondary | ICD-10-CM | POA: Diagnosis not present

## 2022-02-18 DIAGNOSIS — R5383 Other fatigue: Secondary | ICD-10-CM | POA: Diagnosis not present

## 2022-02-18 MED ORDER — PROMETHAZINE-DM 6.25-15 MG/5ML PO SYRP
5.0000 mL | ORAL_SOLUTION | Freq: Four times a day (QID) | ORAL | 0 refills | Status: AC | PRN
  Filled 2022-02-18: qty 120, 6d supply, fill #0

## 2022-02-18 MED ORDER — ESTRADIOL 0.5 MG PO TABS
0.5000 mg | ORAL_TABLET | Freq: Every day | ORAL | 0 refills | Status: DC
  Filled 2022-02-18: qty 90, 90d supply, fill #0

## 2022-02-22 ENCOUNTER — Other Ambulatory Visit (HOSPITAL_COMMUNITY): Payer: Self-pay

## 2022-02-22 MED ORDER — AZITHROMYCIN 250 MG PO TABS
ORAL_TABLET | ORAL | 0 refills | Status: AC
  Filled 2022-02-22: qty 6, 5d supply, fill #0

## 2022-02-26 ENCOUNTER — Other Ambulatory Visit (HOSPITAL_COMMUNITY): Payer: Self-pay

## 2022-03-04 ENCOUNTER — Other Ambulatory Visit (HOSPITAL_COMMUNITY): Payer: Self-pay

## 2022-03-04 MED ORDER — AMOXICILLIN 500 MG PO CAPS
2000.0000 mg | ORAL_CAPSULE | ORAL | 0 refills | Status: AC
  Filled 2022-03-04: qty 8, 2d supply, fill #0

## 2022-03-14 ENCOUNTER — Other Ambulatory Visit (HOSPITAL_COMMUNITY): Payer: Self-pay

## 2022-03-15 ENCOUNTER — Other Ambulatory Visit (HOSPITAL_COMMUNITY): Payer: Self-pay

## 2022-03-15 MED ORDER — OXYBUTYNIN CHLORIDE ER 10 MG PO TB24
10.0000 mg | ORAL_TABLET | Freq: Every day | ORAL | 2 refills | Status: AC
  Filled 2022-03-15: qty 90, 90d supply, fill #0
  Filled 2022-06-24: qty 90, 90d supply, fill #1
  Filled 2022-09-22: qty 90, 90d supply, fill #2

## 2022-03-16 ENCOUNTER — Other Ambulatory Visit (HOSPITAL_COMMUNITY): Payer: Self-pay

## 2022-03-18 ENCOUNTER — Other Ambulatory Visit (HOSPITAL_COMMUNITY): Payer: Self-pay

## 2022-03-18 MED ORDER — OXYBUTYNIN CHLORIDE ER 10 MG PO TB24
10.0000 mg | ORAL_TABLET | Freq: Every day | ORAL | 1 refills | Status: DC
  Filled 2022-03-18 – 2022-12-02 (×2): qty 90, 90d supply, fill #0

## 2022-03-20 ENCOUNTER — Other Ambulatory Visit: Payer: Self-pay

## 2022-04-02 ENCOUNTER — Other Ambulatory Visit (HOSPITAL_COMMUNITY): Payer: Self-pay

## 2022-04-09 ENCOUNTER — Other Ambulatory Visit (HOSPITAL_COMMUNITY): Payer: Self-pay

## 2022-04-09 MED ORDER — JARDIANCE 25 MG PO TABS
25.0000 mg | ORAL_TABLET | Freq: Every day | ORAL | 3 refills | Status: DC
  Filled 2022-04-09: qty 90, 90d supply, fill #0
  Filled 2022-07-04: qty 90, 90d supply, fill #1
  Filled 2022-07-04: qty 30, 30d supply, fill #1
  Filled 2022-08-04: qty 30, 30d supply, fill #2
  Filled 2022-09-08: qty 30, 30d supply, fill #3
  Filled 2022-10-06: qty 30, 30d supply, fill #4
  Filled 2022-11-03: qty 30, 30d supply, fill #5

## 2022-05-01 ENCOUNTER — Other Ambulatory Visit (HOSPITAL_COMMUNITY): Payer: Self-pay

## 2022-05-02 ENCOUNTER — Other Ambulatory Visit (HOSPITAL_COMMUNITY): Payer: Self-pay

## 2022-05-03 DIAGNOSIS — M65861 Other synovitis and tenosynovitis, right lower leg: Secondary | ICD-10-CM | POA: Diagnosis not present

## 2022-05-15 ENCOUNTER — Other Ambulatory Visit (HOSPITAL_COMMUNITY): Payer: Self-pay

## 2022-05-15 MED ORDER — ESTRADIOL 0.5 MG PO TABS
0.5000 mg | ORAL_TABLET | Freq: Every day | ORAL | 0 refills | Status: DC
  Filled 2022-05-15: qty 90, 90d supply, fill #0

## 2022-05-17 DIAGNOSIS — M65861 Other synovitis and tenosynovitis, right lower leg: Secondary | ICD-10-CM | POA: Diagnosis not present

## 2022-05-17 DIAGNOSIS — M25571 Pain in right ankle and joints of right foot: Secondary | ICD-10-CM | POA: Diagnosis not present

## 2022-05-23 DIAGNOSIS — M25571 Pain in right ankle and joints of right foot: Secondary | ICD-10-CM | POA: Diagnosis not present

## 2022-05-28 DIAGNOSIS — M25571 Pain in right ankle and joints of right foot: Secondary | ICD-10-CM | POA: Diagnosis not present

## 2022-05-30 ENCOUNTER — Other Ambulatory Visit (HOSPITAL_COMMUNITY): Payer: Self-pay

## 2022-05-30 DIAGNOSIS — M25571 Pain in right ankle and joints of right foot: Secondary | ICD-10-CM | POA: Diagnosis not present

## 2022-06-03 DIAGNOSIS — M65861 Other synovitis and tenosynovitis, right lower leg: Secondary | ICD-10-CM | POA: Diagnosis not present

## 2022-06-03 DIAGNOSIS — M6701 Short Achilles tendon (acquired), right ankle: Secondary | ICD-10-CM | POA: Diagnosis not present

## 2022-06-10 DIAGNOSIS — E1165 Type 2 diabetes mellitus with hyperglycemia: Secondary | ICD-10-CM | POA: Diagnosis not present

## 2022-06-10 DIAGNOSIS — I10 Essential (primary) hypertension: Secondary | ICD-10-CM | POA: Diagnosis not present

## 2022-06-11 DIAGNOSIS — M65861 Other synovitis and tenosynovitis, right lower leg: Secondary | ICD-10-CM | POA: Diagnosis not present

## 2022-06-11 DIAGNOSIS — M25571 Pain in right ankle and joints of right foot: Secondary | ICD-10-CM | POA: Diagnosis not present

## 2022-06-12 ENCOUNTER — Other Ambulatory Visit (HOSPITAL_COMMUNITY): Payer: Self-pay

## 2022-06-12 MED ORDER — MOUNJARO 2.5 MG/0.5ML ~~LOC~~ SOAJ
2.5000 mg | SUBCUTANEOUS | 0 refills | Status: AC
  Filled 2022-06-12 (×2): qty 2, 28d supply, fill #0

## 2022-06-13 ENCOUNTER — Other Ambulatory Visit (HOSPITAL_COMMUNITY): Payer: Self-pay

## 2022-06-13 DIAGNOSIS — M25571 Pain in right ankle and joints of right foot: Secondary | ICD-10-CM | POA: Diagnosis not present

## 2022-06-14 ENCOUNTER — Other Ambulatory Visit (HOSPITAL_COMMUNITY): Payer: Self-pay

## 2022-06-14 MED ORDER — ACCU-CHEK GUIDE W/DEVICE KIT
PACK | 0 refills | Status: AC
  Filled 2022-06-14: qty 1, 30d supply, fill #0

## 2022-06-20 DIAGNOSIS — M65861 Other synovitis and tenosynovitis, right lower leg: Secondary | ICD-10-CM | POA: Diagnosis not present

## 2022-06-20 DIAGNOSIS — M25571 Pain in right ankle and joints of right foot: Secondary | ICD-10-CM | POA: Diagnosis not present

## 2022-06-24 ENCOUNTER — Other Ambulatory Visit: Payer: Self-pay

## 2022-06-24 ENCOUNTER — Other Ambulatory Visit (HOSPITAL_COMMUNITY): Payer: Self-pay

## 2022-06-24 MED ORDER — ROSUVASTATIN CALCIUM 5 MG PO TABS
ORAL_TABLET | ORAL | 3 refills | Status: DC
  Filled 2022-06-24: qty 39, 90d supply, fill #0
  Filled 2022-09-08: qty 39, 90d supply, fill #1
  Filled 2022-12-01: qty 39, 90d supply, fill #2
  Filled 2023-03-16: qty 39, 90d supply, fill #3

## 2022-06-25 ENCOUNTER — Other Ambulatory Visit (HOSPITAL_COMMUNITY): Payer: Self-pay

## 2022-06-25 DIAGNOSIS — M25571 Pain in right ankle and joints of right foot: Secondary | ICD-10-CM | POA: Diagnosis not present

## 2022-06-25 DIAGNOSIS — M65861 Other synovitis and tenosynovitis, right lower leg: Secondary | ICD-10-CM | POA: Diagnosis not present

## 2022-06-26 ENCOUNTER — Other Ambulatory Visit (HOSPITAL_COMMUNITY): Payer: Self-pay

## 2022-06-26 DIAGNOSIS — E119 Type 2 diabetes mellitus without complications: Secondary | ICD-10-CM | POA: Diagnosis not present

## 2022-06-26 DIAGNOSIS — I10 Essential (primary) hypertension: Secondary | ICD-10-CM | POA: Diagnosis not present

## 2022-06-26 MED ORDER — MOUNJARO 5 MG/0.5ML ~~LOC~~ SOAJ
5.0000 mg | SUBCUTANEOUS | 4 refills | Status: AC
  Filled 2022-06-26: qty 2, 28d supply, fill #0
  Filled 2022-07-31: qty 2, 28d supply, fill #1

## 2022-06-27 ENCOUNTER — Other Ambulatory Visit (HOSPITAL_COMMUNITY): Payer: Self-pay

## 2022-06-28 DIAGNOSIS — M25571 Pain in right ankle and joints of right foot: Secondary | ICD-10-CM | POA: Diagnosis not present

## 2022-06-28 DIAGNOSIS — M65861 Other synovitis and tenosynovitis, right lower leg: Secondary | ICD-10-CM | POA: Diagnosis not present

## 2022-07-02 DIAGNOSIS — M25571 Pain in right ankle and joints of right foot: Secondary | ICD-10-CM | POA: Diagnosis not present

## 2022-07-04 ENCOUNTER — Other Ambulatory Visit: Payer: Self-pay

## 2022-07-04 ENCOUNTER — Other Ambulatory Visit (HOSPITAL_COMMUNITY): Payer: Self-pay

## 2022-07-04 DIAGNOSIS — M65861 Other synovitis and tenosynovitis, right lower leg: Secondary | ICD-10-CM | POA: Diagnosis not present

## 2022-07-04 DIAGNOSIS — M25571 Pain in right ankle and joints of right foot: Secondary | ICD-10-CM | POA: Diagnosis not present

## 2022-07-05 ENCOUNTER — Other Ambulatory Visit (HOSPITAL_COMMUNITY): Payer: Self-pay

## 2022-07-09 DIAGNOSIS — M25571 Pain in right ankle and joints of right foot: Secondary | ICD-10-CM | POA: Diagnosis not present

## 2022-07-11 DIAGNOSIS — M25571 Pain in right ankle and joints of right foot: Secondary | ICD-10-CM | POA: Diagnosis not present

## 2022-07-15 DIAGNOSIS — I10 Essential (primary) hypertension: Secondary | ICD-10-CM | POA: Diagnosis not present

## 2022-07-15 DIAGNOSIS — E782 Mixed hyperlipidemia: Secondary | ICD-10-CM | POA: Diagnosis not present

## 2022-07-15 DIAGNOSIS — Z79899 Other long term (current) drug therapy: Secondary | ICD-10-CM | POA: Diagnosis not present

## 2022-07-15 DIAGNOSIS — E1165 Type 2 diabetes mellitus with hyperglycemia: Secondary | ICD-10-CM | POA: Diagnosis not present

## 2022-07-15 DIAGNOSIS — Z6833 Body mass index (BMI) 33.0-33.9, adult: Secondary | ICD-10-CM | POA: Diagnosis not present

## 2022-07-15 DIAGNOSIS — N952 Postmenopausal atrophic vaginitis: Secondary | ICD-10-CM | POA: Diagnosis not present

## 2022-07-15 DIAGNOSIS — Z0000 Encounter for general adult medical examination without abnormal findings: Secondary | ICD-10-CM | POA: Diagnosis not present

## 2022-07-16 DIAGNOSIS — M25571 Pain in right ankle and joints of right foot: Secondary | ICD-10-CM | POA: Diagnosis not present

## 2022-07-16 DIAGNOSIS — M65861 Other synovitis and tenosynovitis, right lower leg: Secondary | ICD-10-CM | POA: Diagnosis not present

## 2022-07-23 ENCOUNTER — Other Ambulatory Visit (HOSPITAL_COMMUNITY): Payer: Self-pay

## 2022-07-23 DIAGNOSIS — M6701 Short Achilles tendon (acquired), right ankle: Secondary | ICD-10-CM | POA: Diagnosis not present

## 2022-07-23 DIAGNOSIS — M76821 Posterior tibial tendinitis, right leg: Secondary | ICD-10-CM | POA: Diagnosis not present

## 2022-07-23 DIAGNOSIS — M6702 Short Achilles tendon (acquired), left ankle: Secondary | ICD-10-CM | POA: Diagnosis not present

## 2022-07-23 DIAGNOSIS — M67821 Other specified disorders of synovium, right elbow: Secondary | ICD-10-CM | POA: Diagnosis not present

## 2022-07-23 DIAGNOSIS — S93421D Sprain of deltoid ligament of right ankle, subsequent encounter: Secondary | ICD-10-CM | POA: Diagnosis not present

## 2022-07-23 DIAGNOSIS — Q6689 Other  specified congenital deformities of feet: Secondary | ICD-10-CM | POA: Diagnosis not present

## 2022-07-23 DIAGNOSIS — S93422A Sprain of deltoid ligament of left ankle, initial encounter: Secondary | ICD-10-CM | POA: Diagnosis not present

## 2022-07-23 DIAGNOSIS — X58XXXA Exposure to other specified factors, initial encounter: Secondary | ICD-10-CM | POA: Diagnosis not present

## 2022-07-23 DIAGNOSIS — G8918 Other acute postprocedural pain: Secondary | ICD-10-CM | POA: Diagnosis not present

## 2022-07-23 DIAGNOSIS — Y999 Unspecified external cause status: Secondary | ICD-10-CM | POA: Diagnosis not present

## 2022-07-23 MED ORDER — OXYCODONE HCL 5 MG PO TABS
ORAL_TABLET | ORAL | 0 refills | Status: AC
  Filled 2022-07-23: qty 18, 3d supply, fill #0

## 2022-07-23 MED ORDER — XARELTO 10 MG PO TABS
ORAL_TABLET | ORAL | 0 refills | Status: AC
  Filled 2022-07-23: qty 14, 14d supply, fill #0

## 2022-07-29 ENCOUNTER — Other Ambulatory Visit (HOSPITAL_COMMUNITY): Payer: Self-pay

## 2022-07-29 DIAGNOSIS — Z4889 Encounter for other specified surgical aftercare: Secondary | ICD-10-CM | POA: Diagnosis not present

## 2022-07-31 ENCOUNTER — Other Ambulatory Visit (HOSPITAL_COMMUNITY): Payer: Self-pay

## 2022-07-31 MED ORDER — BLOOD GLUCOSE MONITOR SYSTEM W/DEVICE KIT
PACK | 0 refills | Status: AC
  Filled 2022-07-31: qty 1, 30d supply, fill #0

## 2022-07-31 MED ORDER — GLUCOSE BLOOD VI STRP
ORAL_STRIP | 4 refills | Status: AC
  Filled 2022-07-31: qty 100, 100d supply, fill #0

## 2022-07-31 MED ORDER — ONETOUCH DELICA PLUS LANCET33G MISC
4 refills | Status: AC
  Filled 2022-07-31: qty 100, 100d supply, fill #0

## 2022-08-03 ENCOUNTER — Other Ambulatory Visit (HOSPITAL_COMMUNITY): Payer: Self-pay

## 2022-08-05 ENCOUNTER — Other Ambulatory Visit (HOSPITAL_COMMUNITY): Payer: Self-pay

## 2022-08-12 ENCOUNTER — Other Ambulatory Visit (HOSPITAL_COMMUNITY): Payer: Self-pay

## 2022-08-12 DIAGNOSIS — Z4789 Encounter for other orthopedic aftercare: Secondary | ICD-10-CM | POA: Diagnosis not present

## 2022-08-12 MED ORDER — LOSARTAN POTASSIUM 100 MG PO TABS
100.0000 mg | ORAL_TABLET | Freq: Every day | ORAL | 4 refills | Status: DC
  Filled 2022-08-12: qty 30, 30d supply, fill #0
  Filled 2022-09-08: qty 30, 30d supply, fill #1
  Filled 2022-10-06: qty 30, 30d supply, fill #2
  Filled 2022-11-03: qty 30, 30d supply, fill #3
  Filled 2022-12-01: qty 30, 30d supply, fill #4

## 2022-08-19 ENCOUNTER — Other Ambulatory Visit (HOSPITAL_COMMUNITY): Payer: Self-pay

## 2022-08-19 MED ORDER — ESTRADIOL 0.5 MG PO TABS
ORAL_TABLET | ORAL | 0 refills | Status: AC
  Filled 2022-08-19: qty 90, 90d supply, fill #0

## 2022-08-20 ENCOUNTER — Other Ambulatory Visit (HOSPITAL_COMMUNITY): Payer: Self-pay

## 2022-08-30 DIAGNOSIS — M65861 Other synovitis and tenosynovitis, right lower leg: Secondary | ICD-10-CM | POA: Diagnosis not present

## 2022-08-30 DIAGNOSIS — Z4889 Encounter for other specified surgical aftercare: Secondary | ICD-10-CM | POA: Diagnosis not present

## 2022-09-09 ENCOUNTER — Other Ambulatory Visit (HOSPITAL_COMMUNITY): Payer: Self-pay

## 2022-09-11 DIAGNOSIS — E669 Obesity, unspecified: Secondary | ICD-10-CM | POA: Diagnosis not present

## 2022-09-11 DIAGNOSIS — I10 Essential (primary) hypertension: Secondary | ICD-10-CM | POA: Diagnosis not present

## 2022-09-11 DIAGNOSIS — E119 Type 2 diabetes mellitus without complications: Secondary | ICD-10-CM | POA: Diagnosis not present

## 2022-09-17 ENCOUNTER — Other Ambulatory Visit (HOSPITAL_COMMUNITY): Payer: Self-pay

## 2022-09-18 ENCOUNTER — Other Ambulatory Visit (HOSPITAL_COMMUNITY): Payer: Self-pay

## 2022-09-23 ENCOUNTER — Other Ambulatory Visit (HOSPITAL_COMMUNITY): Payer: Self-pay

## 2022-09-25 ENCOUNTER — Other Ambulatory Visit (HOSPITAL_COMMUNITY): Payer: Self-pay

## 2022-09-26 ENCOUNTER — Other Ambulatory Visit (HOSPITAL_COMMUNITY): Payer: Self-pay

## 2022-10-07 ENCOUNTER — Other Ambulatory Visit (HOSPITAL_COMMUNITY): Payer: Self-pay

## 2022-10-07 DIAGNOSIS — Z4789 Encounter for other orthopedic aftercare: Secondary | ICD-10-CM | POA: Diagnosis not present

## 2022-10-07 DIAGNOSIS — M65861 Other synovitis and tenosynovitis, right lower leg: Secondary | ICD-10-CM | POA: Diagnosis not present

## 2022-10-08 ENCOUNTER — Other Ambulatory Visit (HOSPITAL_COMMUNITY): Payer: Self-pay

## 2022-10-08 DIAGNOSIS — M25571 Pain in right ankle and joints of right foot: Secondary | ICD-10-CM | POA: Diagnosis not present

## 2022-10-09 ENCOUNTER — Other Ambulatory Visit (HOSPITAL_COMMUNITY): Payer: Self-pay

## 2022-10-14 DIAGNOSIS — M25571 Pain in right ankle and joints of right foot: Secondary | ICD-10-CM | POA: Diagnosis not present

## 2022-10-16 ENCOUNTER — Other Ambulatory Visit (HOSPITAL_COMMUNITY): Payer: Self-pay

## 2022-10-16 DIAGNOSIS — M25571 Pain in right ankle and joints of right foot: Secondary | ICD-10-CM | POA: Diagnosis not present

## 2022-10-16 DIAGNOSIS — H6123 Impacted cerumen, bilateral: Secondary | ICD-10-CM | POA: Diagnosis not present

## 2022-10-17 ENCOUNTER — Other Ambulatory Visit (HOSPITAL_COMMUNITY): Payer: Self-pay

## 2022-10-20 ENCOUNTER — Other Ambulatory Visit (HOSPITAL_COMMUNITY): Payer: Self-pay

## 2022-10-21 ENCOUNTER — Other Ambulatory Visit (HOSPITAL_COMMUNITY): Payer: Self-pay

## 2022-10-21 MED ORDER — ATENOLOL 50 MG PO TABS
50.0000 mg | ORAL_TABLET | Freq: Every day | ORAL | 3 refills | Status: DC
  Filled 2022-10-21: qty 90, 90d supply, fill #0
  Filled 2023-01-16 – 2023-01-17 (×2): qty 90, 90d supply, fill #1
  Filled 2023-04-20: qty 90, 90d supply, fill #2
  Filled 2023-07-17: qty 90, 90d supply, fill #3

## 2022-10-22 DIAGNOSIS — M25571 Pain in right ankle and joints of right foot: Secondary | ICD-10-CM | POA: Diagnosis not present

## 2022-10-25 DIAGNOSIS — M25571 Pain in right ankle and joints of right foot: Secondary | ICD-10-CM | POA: Diagnosis not present

## 2022-10-29 DIAGNOSIS — M25571 Pain in right ankle and joints of right foot: Secondary | ICD-10-CM | POA: Diagnosis not present

## 2022-10-31 DIAGNOSIS — M25571 Pain in right ankle and joints of right foot: Secondary | ICD-10-CM | POA: Diagnosis not present

## 2022-11-04 ENCOUNTER — Other Ambulatory Visit: Payer: Self-pay

## 2022-11-05 ENCOUNTER — Other Ambulatory Visit (HOSPITAL_COMMUNITY): Payer: Self-pay

## 2022-11-05 DIAGNOSIS — M25571 Pain in right ankle and joints of right foot: Secondary | ICD-10-CM | POA: Diagnosis not present

## 2022-11-07 DIAGNOSIS — M65861 Other synovitis and tenosynovitis, right lower leg: Secondary | ICD-10-CM | POA: Diagnosis not present

## 2022-11-11 ENCOUNTER — Other Ambulatory Visit (HOSPITAL_COMMUNITY): Payer: Self-pay

## 2022-11-12 ENCOUNTER — Other Ambulatory Visit (HOSPITAL_COMMUNITY): Payer: Self-pay

## 2022-11-12 MED ORDER — ESTRADIOL 0.5 MG PO TABS
0.5000 mg | ORAL_TABLET | Freq: Every day | ORAL | 0 refills | Status: DC
  Filled 2022-11-12: qty 90, 90d supply, fill #0

## 2022-12-01 ENCOUNTER — Other Ambulatory Visit (HOSPITAL_COMMUNITY): Payer: Self-pay

## 2022-12-02 ENCOUNTER — Other Ambulatory Visit (HOSPITAL_COMMUNITY): Payer: Self-pay

## 2022-12-30 ENCOUNTER — Other Ambulatory Visit (HOSPITAL_COMMUNITY): Payer: Self-pay

## 2022-12-30 DIAGNOSIS — I10 Essential (primary) hypertension: Secondary | ICD-10-CM | POA: Diagnosis not present

## 2022-12-30 DIAGNOSIS — J069 Acute upper respiratory infection, unspecified: Secondary | ICD-10-CM | POA: Diagnosis not present

## 2022-12-30 DIAGNOSIS — E6609 Other obesity due to excess calories: Secondary | ICD-10-CM | POA: Diagnosis not present

## 2022-12-30 DIAGNOSIS — E1165 Type 2 diabetes mellitus with hyperglycemia: Secondary | ICD-10-CM | POA: Diagnosis not present

## 2022-12-30 DIAGNOSIS — Z79890 Hormone replacement therapy (postmenopausal): Secondary | ICD-10-CM | POA: Diagnosis not present

## 2022-12-30 DIAGNOSIS — Z6833 Body mass index (BMI) 33.0-33.9, adult: Secondary | ICD-10-CM | POA: Diagnosis not present

## 2022-12-30 MED ORDER — BENZONATATE 100 MG PO CAPS
100.0000 mg | ORAL_CAPSULE | Freq: Three times a day (TID) | ORAL | 0 refills | Status: AC
  Filled 2022-12-30: qty 30, 10d supply, fill #0

## 2022-12-30 MED ORDER — LOSARTAN POTASSIUM 100 MG PO TABS
100.0000 mg | ORAL_TABLET | Freq: Every day | ORAL | 2 refills | Status: DC
  Filled 2022-12-30: qty 90, 90d supply, fill #0
  Filled 2023-04-06: qty 90, 90d supply, fill #1
  Filled 2023-07-02 (×2): qty 90, 90d supply, fill #2

## 2023-01-02 ENCOUNTER — Other Ambulatory Visit (HOSPITAL_COMMUNITY): Payer: Self-pay

## 2023-01-02 ENCOUNTER — Other Ambulatory Visit: Payer: Self-pay

## 2023-01-02 MED ORDER — AMOXICILLIN-POT CLAVULANATE 875-125 MG PO TABS
1.0000 | ORAL_TABLET | Freq: Two times a day (BID) | ORAL | 0 refills | Status: AC
  Filled 2023-01-02 (×2): qty 14, 7d supply, fill #0

## 2023-01-16 ENCOUNTER — Other Ambulatory Visit: Payer: Self-pay

## 2023-01-17 ENCOUNTER — Other Ambulatory Visit (HOSPITAL_COMMUNITY): Payer: Self-pay

## 2023-01-20 ENCOUNTER — Other Ambulatory Visit (HOSPITAL_COMMUNITY): Payer: Self-pay

## 2023-01-20 DIAGNOSIS — J329 Chronic sinusitis, unspecified: Secondary | ICD-10-CM | POA: Diagnosis not present

## 2023-01-20 DIAGNOSIS — R051 Acute cough: Secondary | ICD-10-CM | POA: Diagnosis not present

## 2023-01-20 MED ORDER — AMOXICILLIN 500 MG PO CAPS
ORAL_CAPSULE | ORAL | 0 refills | Status: AC
  Filled 2023-01-20: qty 4, 1d supply, fill #0

## 2023-01-20 MED ORDER — DOXYCYCLINE HYCLATE 100 MG PO CAPS
100.0000 mg | ORAL_CAPSULE | Freq: Two times a day (BID) | ORAL | 0 refills | Status: AC
  Filled 2023-01-20: qty 20, 10d supply, fill #0

## 2023-01-20 MED ORDER — GUAIFENESIN-CODEINE 100-10 MG/5ML PO SYRP
5.0000 mL | ORAL_SOLUTION | Freq: Three times a day (TID) | ORAL | 0 refills | Status: AC | PRN
Start: 2023-01-20 — End: ?
  Filled 2023-01-20: qty 75, 5d supply, fill #0

## 2023-02-12 DIAGNOSIS — E119 Type 2 diabetes mellitus without complications: Secondary | ICD-10-CM | POA: Diagnosis not present

## 2023-02-12 DIAGNOSIS — E669 Obesity, unspecified: Secondary | ICD-10-CM | POA: Diagnosis not present

## 2023-02-12 DIAGNOSIS — E1165 Type 2 diabetes mellitus with hyperglycemia: Secondary | ICD-10-CM | POA: Diagnosis not present

## 2023-02-12 DIAGNOSIS — I10 Essential (primary) hypertension: Secondary | ICD-10-CM | POA: Diagnosis not present

## 2023-02-23 ENCOUNTER — Other Ambulatory Visit (HOSPITAL_COMMUNITY): Payer: Self-pay

## 2023-02-24 ENCOUNTER — Other Ambulatory Visit (HOSPITAL_COMMUNITY): Payer: Self-pay

## 2023-02-24 MED ORDER — ESTRADIOL 0.5 MG PO TABS
0.5000 mg | ORAL_TABLET | Freq: Every day | ORAL | 0 refills | Status: DC
  Filled 2023-02-24: qty 90, 90d supply, fill #0

## 2023-02-26 ENCOUNTER — Other Ambulatory Visit (HOSPITAL_COMMUNITY): Payer: Self-pay

## 2023-03-18 ENCOUNTER — Other Ambulatory Visit (HOSPITAL_COMMUNITY): Payer: Self-pay

## 2023-03-24 ENCOUNTER — Other Ambulatory Visit (HOSPITAL_COMMUNITY): Payer: Self-pay

## 2023-03-24 MED ORDER — OXYBUTYNIN CHLORIDE ER 10 MG PO TB24
10.0000 mg | ORAL_TABLET | Freq: Every day | ORAL | 1 refills | Status: DC
  Filled 2023-03-24: qty 90, 90d supply, fill #0
  Filled 2023-06-25: qty 90, 90d supply, fill #1

## 2023-04-07 ENCOUNTER — Other Ambulatory Visit (HOSPITAL_COMMUNITY): Payer: Self-pay

## 2023-04-08 ENCOUNTER — Other Ambulatory Visit (HOSPITAL_COMMUNITY): Payer: Self-pay

## 2023-05-18 ENCOUNTER — Other Ambulatory Visit (HOSPITAL_COMMUNITY): Payer: Self-pay

## 2023-05-19 ENCOUNTER — Other Ambulatory Visit: Payer: Self-pay

## 2023-05-19 ENCOUNTER — Other Ambulatory Visit (HOSPITAL_COMMUNITY): Payer: Self-pay

## 2023-05-19 MED ORDER — ESTRADIOL 0.5 MG PO TABS
0.5000 mg | ORAL_TABLET | Freq: Every day | ORAL | 0 refills | Status: AC
  Filled 2023-05-19: qty 90, 90d supply, fill #0

## 2023-06-16 ENCOUNTER — Other Ambulatory Visit (HOSPITAL_COMMUNITY): Payer: Self-pay

## 2023-06-16 MED ORDER — ROSUVASTATIN CALCIUM 5 MG PO TABS
5.0000 mg | ORAL_TABLET | ORAL | 3 refills | Status: AC
  Filled 2023-06-16: qty 39, 91d supply, fill #0
  Filled 2023-09-10: qty 39, 91d supply, fill #1
  Filled 2023-12-11: qty 39, 91d supply, fill #2
  Filled 2024-03-12: qty 39, 91d supply, fill #3

## 2023-06-17 ENCOUNTER — Other Ambulatory Visit (HOSPITAL_COMMUNITY): Payer: Self-pay

## 2023-07-02 ENCOUNTER — Other Ambulatory Visit (HOSPITAL_COMMUNITY): Payer: Self-pay

## 2023-07-08 DIAGNOSIS — Z6833 Body mass index (BMI) 33.0-33.9, adult: Secondary | ICD-10-CM | POA: Diagnosis not present

## 2023-07-08 DIAGNOSIS — Z1151 Encounter for screening for human papillomavirus (HPV): Secondary | ICD-10-CM | POA: Diagnosis not present

## 2023-07-08 DIAGNOSIS — Z124 Encounter for screening for malignant neoplasm of cervix: Secondary | ICD-10-CM | POA: Diagnosis not present

## 2023-07-08 DIAGNOSIS — Z1272 Encounter for screening for malignant neoplasm of vagina: Secondary | ICD-10-CM | POA: Diagnosis not present

## 2023-07-08 DIAGNOSIS — Z1231 Encounter for screening mammogram for malignant neoplasm of breast: Secondary | ICD-10-CM | POA: Diagnosis not present

## 2023-07-17 DIAGNOSIS — E782 Mixed hyperlipidemia: Secondary | ICD-10-CM | POA: Diagnosis not present

## 2023-07-17 DIAGNOSIS — Z780 Asymptomatic menopausal state: Secondary | ICD-10-CM | POA: Diagnosis not present

## 2023-07-17 DIAGNOSIS — I10 Essential (primary) hypertension: Secondary | ICD-10-CM | POA: Diagnosis not present

## 2023-07-17 DIAGNOSIS — Z23 Encounter for immunization: Secondary | ICD-10-CM | POA: Diagnosis not present

## 2023-07-17 DIAGNOSIS — E1165 Type 2 diabetes mellitus with hyperglycemia: Secondary | ICD-10-CM | POA: Diagnosis not present

## 2023-07-17 DIAGNOSIS — Z0000 Encounter for general adult medical examination without abnormal findings: Secondary | ICD-10-CM | POA: Diagnosis not present

## 2023-07-18 ENCOUNTER — Other Ambulatory Visit: Payer: Self-pay | Admitting: Family Medicine

## 2023-07-18 DIAGNOSIS — Z780 Asymptomatic menopausal state: Secondary | ICD-10-CM

## 2023-08-14 ENCOUNTER — Other Ambulatory Visit (HOSPITAL_COMMUNITY): Payer: Self-pay

## 2023-08-14 DIAGNOSIS — H6123 Impacted cerumen, bilateral: Secondary | ICD-10-CM | POA: Diagnosis not present

## 2023-08-14 MED ORDER — ESTRADIOL 0.1 MG/GM VA CREA
TOPICAL_CREAM | VAGINAL | 1 refills | Status: DC
  Filled 2023-08-14: qty 42.5, 90d supply, fill #0

## 2023-08-15 ENCOUNTER — Other Ambulatory Visit (HOSPITAL_COMMUNITY): Payer: Self-pay

## 2023-08-15 MED ORDER — ESTRADIOL 0.1 MG/GM VA CREA
TOPICAL_CREAM | VAGINAL | 1 refills | Status: AC
  Filled 2023-08-15 (×2): qty 42.5, 90d supply, fill #0

## 2023-08-18 ENCOUNTER — Other Ambulatory Visit (HOSPITAL_COMMUNITY): Payer: Self-pay

## 2023-08-20 DIAGNOSIS — I10 Essential (primary) hypertension: Secondary | ICD-10-CM | POA: Diagnosis not present

## 2023-08-20 DIAGNOSIS — E782 Mixed hyperlipidemia: Secondary | ICD-10-CM | POA: Diagnosis not present

## 2023-08-20 DIAGNOSIS — E669 Obesity, unspecified: Secondary | ICD-10-CM | POA: Diagnosis not present

## 2023-08-20 DIAGNOSIS — E119 Type 2 diabetes mellitus without complications: Secondary | ICD-10-CM | POA: Diagnosis not present

## 2023-08-22 ENCOUNTER — Other Ambulatory Visit (HOSPITAL_COMMUNITY): Payer: Self-pay

## 2023-08-27 ENCOUNTER — Other Ambulatory Visit (HOSPITAL_BASED_OUTPATIENT_CLINIC_OR_DEPARTMENT_OTHER): Payer: Self-pay

## 2023-08-27 ENCOUNTER — Other Ambulatory Visit (HOSPITAL_COMMUNITY): Payer: Self-pay

## 2023-08-27 MED ORDER — MOUNJARO 5 MG/0.5ML ~~LOC~~ SOAJ
5.0000 mg | SUBCUTANEOUS | 0 refills | Status: DC
  Filled 2023-10-24: qty 2, 28d supply, fill #0

## 2023-08-27 MED ORDER — MOUNJARO 2.5 MG/0.5ML ~~LOC~~ SOAJ
2.5000 mg | SUBCUTANEOUS | 0 refills | Status: AC
  Filled 2023-08-27: qty 2, 28d supply, fill #0

## 2023-08-28 ENCOUNTER — Other Ambulatory Visit (HOSPITAL_COMMUNITY): Payer: Self-pay

## 2023-09-02 ENCOUNTER — Other Ambulatory Visit (HOSPITAL_COMMUNITY): Payer: Self-pay

## 2023-09-08 DIAGNOSIS — E119 Type 2 diabetes mellitus without complications: Secondary | ICD-10-CM | POA: Diagnosis not present

## 2023-09-08 DIAGNOSIS — H0102A Squamous blepharitis right eye, upper and lower eyelids: Secondary | ICD-10-CM | POA: Diagnosis not present

## 2023-09-08 DIAGNOSIS — H43813 Vitreous degeneration, bilateral: Secondary | ICD-10-CM | POA: Diagnosis not present

## 2023-09-08 DIAGNOSIS — H02834 Dermatochalasis of left upper eyelid: Secondary | ICD-10-CM | POA: Diagnosis not present

## 2023-09-08 DIAGNOSIS — H26493 Other secondary cataract, bilateral: Secondary | ICD-10-CM | POA: Diagnosis not present

## 2023-09-08 DIAGNOSIS — H02831 Dermatochalasis of right upper eyelid: Secondary | ICD-10-CM | POA: Diagnosis not present

## 2023-09-08 DIAGNOSIS — Z961 Presence of intraocular lens: Secondary | ICD-10-CM | POA: Diagnosis not present

## 2023-09-08 DIAGNOSIS — H0102B Squamous blepharitis left eye, upper and lower eyelids: Secondary | ICD-10-CM | POA: Diagnosis not present

## 2023-09-10 ENCOUNTER — Other Ambulatory Visit (HOSPITAL_COMMUNITY): Payer: Self-pay

## 2023-09-10 MED ORDER — AMOXICILLIN 500 MG PO CAPS
2000.0000 mg | ORAL_CAPSULE | ORAL | 0 refills | Status: AC
  Filled 2023-09-10: qty 8, 2d supply, fill #0

## 2023-09-22 ENCOUNTER — Other Ambulatory Visit (HOSPITAL_COMMUNITY): Payer: Self-pay

## 2023-09-22 MED ORDER — MOUNJARO 5 MG/0.5ML ~~LOC~~ SOAJ
5.0000 mg | SUBCUTANEOUS | 0 refills | Status: AC
  Filled 2023-09-22: qty 2, 28d supply, fill #0

## 2023-09-24 ENCOUNTER — Other Ambulatory Visit (HOSPITAL_COMMUNITY): Payer: Self-pay

## 2023-09-24 MED ORDER — OXYBUTYNIN CHLORIDE ER 10 MG PO TB24
10.0000 mg | ORAL_TABLET | Freq: Every day | ORAL | 1 refills | Status: DC
  Filled 2023-09-24: qty 90, 90d supply, fill #0
  Filled 2023-12-25: qty 90, 90d supply, fill #1

## 2023-09-29 DIAGNOSIS — N958 Other specified menopausal and perimenopausal disorders: Secondary | ICD-10-CM | POA: Diagnosis not present

## 2023-09-29 DIAGNOSIS — Z13820 Encounter for screening for osteoporosis: Secondary | ICD-10-CM | POA: Diagnosis not present

## 2023-10-01 ENCOUNTER — Other Ambulatory Visit (HOSPITAL_COMMUNITY): Payer: Self-pay

## 2023-10-02 ENCOUNTER — Other Ambulatory Visit (HOSPITAL_COMMUNITY): Payer: Self-pay

## 2023-10-02 MED ORDER — LOSARTAN POTASSIUM 100 MG PO TABS
100.0000 mg | ORAL_TABLET | Freq: Every day | ORAL | 3 refills | Status: AC
  Filled 2023-10-02: qty 90, 90d supply, fill #0
  Filled 2024-01-05: qty 90, 90d supply, fill #1
  Filled 2024-04-06: qty 90, 90d supply, fill #2

## 2023-10-13 DIAGNOSIS — L570 Actinic keratosis: Secondary | ICD-10-CM | POA: Diagnosis not present

## 2023-10-13 DIAGNOSIS — L821 Other seborrheic keratosis: Secondary | ICD-10-CM | POA: Diagnosis not present

## 2023-10-15 ENCOUNTER — Other Ambulatory Visit (HOSPITAL_COMMUNITY): Payer: Self-pay

## 2023-10-15 MED ORDER — ATENOLOL 50 MG PO TABS
50.0000 mg | ORAL_TABLET | Freq: Every day | ORAL | 3 refills | Status: AC
  Filled 2023-10-15: qty 90, 90d supply, fill #0
  Filled 2024-01-21: qty 90, 90d supply, fill #1
  Filled 2024-04-17: qty 90, 90d supply, fill #2

## 2023-10-22 DIAGNOSIS — H02423 Myogenic ptosis of bilateral eyelids: Secondary | ICD-10-CM | POA: Diagnosis not present

## 2023-10-22 DIAGNOSIS — Z01818 Encounter for other preprocedural examination: Secondary | ICD-10-CM | POA: Diagnosis not present

## 2023-10-22 DIAGNOSIS — H0279 Other degenerative disorders of eyelid and periocular area: Secondary | ICD-10-CM | POA: Diagnosis not present

## 2023-10-22 DIAGNOSIS — H02413 Mechanical ptosis of bilateral eyelids: Secondary | ICD-10-CM | POA: Diagnosis not present

## 2023-10-22 DIAGNOSIS — H02831 Dermatochalasis of right upper eyelid: Secondary | ICD-10-CM | POA: Diagnosis not present

## 2023-10-22 DIAGNOSIS — H02834 Dermatochalasis of left upper eyelid: Secondary | ICD-10-CM | POA: Diagnosis not present

## 2023-10-22 DIAGNOSIS — H57813 Brow ptosis, bilateral: Secondary | ICD-10-CM | POA: Diagnosis not present

## 2023-10-22 DIAGNOSIS — H02422 Myogenic ptosis of left eyelid: Secondary | ICD-10-CM | POA: Diagnosis not present

## 2023-10-22 DIAGNOSIS — H02412 Mechanical ptosis of left eyelid: Secondary | ICD-10-CM | POA: Diagnosis not present

## 2023-10-22 DIAGNOSIS — H53483 Generalized contraction of visual field, bilateral: Secondary | ICD-10-CM | POA: Diagnosis not present

## 2023-10-22 DIAGNOSIS — H02411 Mechanical ptosis of right eyelid: Secondary | ICD-10-CM | POA: Diagnosis not present

## 2023-10-22 DIAGNOSIS — H02421 Myogenic ptosis of right eyelid: Secondary | ICD-10-CM | POA: Diagnosis not present

## 2023-10-24 ENCOUNTER — Other Ambulatory Visit (HOSPITAL_COMMUNITY): Payer: Self-pay

## 2023-10-28 ENCOUNTER — Other Ambulatory Visit: Payer: Self-pay

## 2023-10-30 ENCOUNTER — Other Ambulatory Visit (HOSPITAL_COMMUNITY): Payer: Self-pay

## 2023-10-30 MED ORDER — JARDIANCE 25 MG PO TABS
25.0000 mg | ORAL_TABLET | Freq: Every day | ORAL | 3 refills | Status: AC
  Filled 2023-10-30: qty 90, 90d supply, fill #0
  Filled 2024-03-06: qty 90, 90d supply, fill #1

## 2023-11-10 DIAGNOSIS — H53483 Generalized contraction of visual field, bilateral: Secondary | ICD-10-CM | POA: Diagnosis not present

## 2023-11-24 ENCOUNTER — Other Ambulatory Visit (HOSPITAL_COMMUNITY): Payer: Self-pay

## 2023-11-24 MED ORDER — MOUNJARO 5 MG/0.5ML ~~LOC~~ SOAJ
5.0000 mg | SUBCUTANEOUS | 5 refills | Status: DC
  Filled 2023-11-24: qty 2, 28d supply, fill #0
  Filled 2023-12-17: qty 2, 28d supply, fill #1
  Filled 2024-01-13: qty 2, 28d supply, fill #2
  Filled 2024-02-10: qty 2, 28d supply, fill #3
  Filled 2024-03-06: qty 2, 28d supply, fill #4
  Filled 2024-04-06: qty 2, 28d supply, fill #5

## 2023-12-03 DIAGNOSIS — L821 Other seborrheic keratosis: Secondary | ICD-10-CM | POA: Diagnosis not present

## 2023-12-03 DIAGNOSIS — C4359 Malignant melanoma of other part of trunk: Secondary | ICD-10-CM | POA: Diagnosis not present

## 2023-12-03 DIAGNOSIS — L570 Actinic keratosis: Secondary | ICD-10-CM | POA: Diagnosis not present

## 2023-12-03 DIAGNOSIS — D1801 Hemangioma of skin and subcutaneous tissue: Secondary | ICD-10-CM | POA: Diagnosis not present

## 2023-12-03 DIAGNOSIS — Z85820 Personal history of malignant melanoma of skin: Secondary | ICD-10-CM | POA: Diagnosis not present

## 2023-12-03 DIAGNOSIS — D044 Carcinoma in situ of skin of scalp and neck: Secondary | ICD-10-CM | POA: Diagnosis not present

## 2023-12-03 DIAGNOSIS — D692 Other nonthrombocytopenic purpura: Secondary | ICD-10-CM | POA: Diagnosis not present

## 2023-12-16 ENCOUNTER — Other Ambulatory Visit (HOSPITAL_COMMUNITY): Payer: Self-pay

## 2023-12-17 ENCOUNTER — Other Ambulatory Visit (HOSPITAL_COMMUNITY): Payer: Self-pay

## 2023-12-18 DIAGNOSIS — D0359 Melanoma in situ of other part of trunk: Secondary | ICD-10-CM | POA: Diagnosis not present

## 2023-12-18 DIAGNOSIS — C4359 Malignant melanoma of other part of trunk: Secondary | ICD-10-CM | POA: Diagnosis not present

## 2023-12-18 DIAGNOSIS — Z85820 Personal history of malignant melanoma of skin: Secondary | ICD-10-CM | POA: Diagnosis not present

## 2023-12-23 ENCOUNTER — Other Ambulatory Visit (HOSPITAL_COMMUNITY): Payer: Self-pay

## 2023-12-23 DIAGNOSIS — J069 Acute upper respiratory infection, unspecified: Secondary | ICD-10-CM | POA: Diagnosis not present

## 2023-12-23 MED ORDER — BENZONATATE 200 MG PO CAPS
200.0000 mg | ORAL_CAPSULE | Freq: Three times a day (TID) | ORAL | 0 refills | Status: AC
  Filled 2023-12-23: qty 21, 7d supply, fill #0

## 2023-12-25 ENCOUNTER — Other Ambulatory Visit (HOSPITAL_COMMUNITY): Payer: Self-pay

## 2023-12-26 ENCOUNTER — Other Ambulatory Visit (HOSPITAL_COMMUNITY): Payer: Self-pay

## 2024-01-07 ENCOUNTER — Other Ambulatory Visit (HOSPITAL_COMMUNITY): Payer: Self-pay

## 2024-01-13 ENCOUNTER — Other Ambulatory Visit (HOSPITAL_COMMUNITY): Payer: Self-pay

## 2024-01-21 ENCOUNTER — Ambulatory Visit: Admitting: Podiatry

## 2024-01-21 ENCOUNTER — Ambulatory Visit

## 2024-01-21 ENCOUNTER — Other Ambulatory Visit (HOSPITAL_COMMUNITY): Payer: Self-pay

## 2024-01-21 DIAGNOSIS — L03031 Cellulitis of right toe: Secondary | ICD-10-CM | POA: Diagnosis not present

## 2024-01-21 DIAGNOSIS — M7751 Other enthesopathy of right foot: Secondary | ICD-10-CM

## 2024-01-21 MED ORDER — DOXYCYCLINE HYCLATE 100 MG PO TABS
100.0000 mg | ORAL_TABLET | Freq: Two times a day (BID) | ORAL | 1 refills | Status: AC
Start: 2024-01-21 — End: ?
  Filled 2024-01-21: qty 20, 10d supply, fill #0

## 2024-01-22 ENCOUNTER — Ambulatory Visit: Admitting: Podiatry

## 2024-01-22 NOTE — Progress Notes (Signed)
 Subjective:   Patient ID: Jessica Bowers, female   DOB: 73 y.o.   MRN: [id number]   HPI Patient presents stating her  right big toe has become symptomatic and it has discoloration and concerns about infection with patient just helping to move her daughter and on her feet a lot   ROS      Objective:  Physical Exam  Neurovascular status unchanged patient has diabetes does have relative profound neuropathy and has discoloration of the right hallux with what appears to be trauma to both the inner phalangeal joint distal with swelling inner phalangeal joint distal with no proximal edema erythema drainage noted     Assessment:  Probability for trauma to the right hallux left with the probability for infective localized process     Plan:  H&P x-ray reviewed advised on soaks and placed on doxycycline .  I may need to remove part of the nail but we will give it a chance first before making that decision and all questions answered today on what to watch for and any other issues that may occur  X-rays do indicate arthritis but did not indicate fracture of the hallux secondary to the profound injury present

## 2024-02-10 ENCOUNTER — Other Ambulatory Visit (HOSPITAL_COMMUNITY): Payer: Self-pay

## 2024-02-16 DIAGNOSIS — H53483 Generalized contraction of visual field, bilateral: Secondary | ICD-10-CM | POA: Diagnosis not present

## 2024-02-25 DIAGNOSIS — H02412 Mechanical ptosis of left eyelid: Secondary | ICD-10-CM | POA: Diagnosis not present

## 2024-02-25 DIAGNOSIS — H53483 Generalized contraction of visual field, bilateral: Secondary | ICD-10-CM | POA: Diagnosis not present

## 2024-02-25 DIAGNOSIS — H02413 Mechanical ptosis of bilateral eyelids: Secondary | ICD-10-CM | POA: Diagnosis not present

## 2024-02-25 DIAGNOSIS — H02831 Dermatochalasis of right upper eyelid: Secondary | ICD-10-CM | POA: Diagnosis not present

## 2024-02-25 DIAGNOSIS — H57813 Brow ptosis, bilateral: Secondary | ICD-10-CM | POA: Diagnosis not present

## 2024-02-25 DIAGNOSIS — H02411 Mechanical ptosis of right eyelid: Secondary | ICD-10-CM | POA: Diagnosis not present

## 2024-02-25 DIAGNOSIS — H02423 Myogenic ptosis of bilateral eyelids: Secondary | ICD-10-CM | POA: Diagnosis not present

## 2024-02-25 DIAGNOSIS — H02422 Myogenic ptosis of left eyelid: Secondary | ICD-10-CM | POA: Diagnosis not present

## 2024-02-25 DIAGNOSIS — H02834 Dermatochalasis of left upper eyelid: Secondary | ICD-10-CM | POA: Diagnosis not present

## 2024-02-25 DIAGNOSIS — Z01818 Encounter for other preprocedural examination: Secondary | ICD-10-CM | POA: Diagnosis not present

## 2024-02-25 DIAGNOSIS — H0279 Other degenerative disorders of eyelid and periocular area: Secondary | ICD-10-CM | POA: Diagnosis not present

## 2024-02-25 DIAGNOSIS — H02421 Myogenic ptosis of right eyelid: Secondary | ICD-10-CM | POA: Diagnosis not present

## 2024-03-03 DIAGNOSIS — E119 Type 2 diabetes mellitus without complications: Secondary | ICD-10-CM | POA: Diagnosis not present

## 2024-03-03 DIAGNOSIS — I10 Essential (primary) hypertension: Secondary | ICD-10-CM | POA: Diagnosis not present

## 2024-03-03 DIAGNOSIS — E669 Obesity, unspecified: Secondary | ICD-10-CM | POA: Diagnosis not present

## 2024-03-03 DIAGNOSIS — E782 Mixed hyperlipidemia: Secondary | ICD-10-CM | POA: Diagnosis not present

## 2024-03-08 DIAGNOSIS — L570 Actinic keratosis: Secondary | ICD-10-CM | POA: Diagnosis not present

## 2024-03-08 DIAGNOSIS — D044 Carcinoma in situ of skin of scalp and neck: Secondary | ICD-10-CM | POA: Diagnosis not present

## 2024-03-08 DIAGNOSIS — Z85820 Personal history of malignant melanoma of skin: Secondary | ICD-10-CM | POA: Diagnosis not present

## 2024-03-23 ENCOUNTER — Other Ambulatory Visit

## 2024-03-30 ENCOUNTER — Other Ambulatory Visit (HOSPITAL_COMMUNITY): Payer: Self-pay

## 2024-04-06 ENCOUNTER — Other Ambulatory Visit (HOSPITAL_COMMUNITY): Payer: Self-pay

## 2024-04-06 MED ORDER — OXYBUTYNIN CHLORIDE ER 10 MG PO TB24
10.0000 mg | ORAL_TABLET | Freq: Every day | ORAL | 0 refills | Status: AC
  Filled 2024-04-06: qty 90, 90d supply, fill #0

## 2024-04-13 ENCOUNTER — Other Ambulatory Visit (HOSPITAL_COMMUNITY): Payer: Self-pay

## 2024-04-13 MED ORDER — ESTRADIOL 10 MCG VA TABS
10.0000 ug | ORAL_TABLET | VAGINAL | 3 refills | Status: AC
  Filled 2024-04-13: qty 24, 84d supply, fill #0

## 2024-04-23 ENCOUNTER — Other Ambulatory Visit (HOSPITAL_COMMUNITY): Payer: Self-pay

## 2024-04-26 ENCOUNTER — Other Ambulatory Visit (HOSPITAL_COMMUNITY): Payer: Self-pay

## 2024-05-06 ENCOUNTER — Other Ambulatory Visit (HOSPITAL_COMMUNITY): Payer: Self-pay

## 2024-05-06 MED ORDER — MOUNJARO 5 MG/0.5ML ~~LOC~~ SOAJ
SUBCUTANEOUS | 5 refills | Status: AC
  Filled 2024-05-06: qty 2, 28d supply, fill #0
